# Patient Record
Sex: Male | Born: 1950 | Race: Black or African American | Hispanic: No | Marital: Married | Smoking: Never smoker
Health system: Southern US, Community
[De-identification: ages and names within clinical notes are randomized; demographics above are authoritative.]

## PROBLEM LIST (undated history)

## (undated) DIAGNOSIS — N189 Chronic kidney disease, unspecified: Secondary | ICD-10-CM

## (undated) DIAGNOSIS — C9000 Multiple myeloma not having achieved remission: Secondary | ICD-10-CM

## (undated) DIAGNOSIS — F09 Unspecified mental disorder due to known physiological condition: Secondary | ICD-10-CM

## (undated) DIAGNOSIS — F0390 Unspecified dementia without behavioral disturbance: Secondary | ICD-10-CM

## (undated) DIAGNOSIS — C49A0 Gastrointestinal stromal tumor, unspecified site: Secondary | ICD-10-CM

## (undated) DIAGNOSIS — I10 Essential (primary) hypertension: Secondary | ICD-10-CM

## (undated) DIAGNOSIS — H409 Unspecified glaucoma: Secondary | ICD-10-CM

## (undated) DIAGNOSIS — M25562 Pain in left knee: Secondary | ICD-10-CM

## (undated) DIAGNOSIS — I2510 Atherosclerotic heart disease of native coronary artery without angina pectoris: Secondary | ICD-10-CM

## (undated) HISTORY — PX: NO PAST SURGERIES: SHX2092

## (undated) HISTORY — PX: ABDOMINAL SURGERY: SHX537

---

## 2012-03-30 LAB — CBC
HGB: 14.4 g/dL (ref 13.0–18.0)
MCH: 27.8 pg (ref 26.0–34.0)
MCV: 84 fL (ref 80–100)
Platelet: 216 10*3/uL (ref 150–440)
RDW: 14 % (ref 11.5–14.5)
WBC: 16.4 10*3/uL — ABNORMAL HIGH (ref 3.8–10.6)

## 2012-03-30 LAB — BASIC METABOLIC PANEL
Anion Gap: 9 (ref 7–16)
BUN: 19 mg/dL — ABNORMAL HIGH (ref 7–18)
Calcium, Total: 8.6 mg/dL (ref 8.5–10.1)
Chloride: 106 mmol/L (ref 98–107)
Creatinine: 1.97 mg/dL — ABNORMAL HIGH (ref 0.60–1.30)
EGFR (African American): 41 — ABNORMAL LOW
Osmolality: 290 (ref 275–301)
Potassium: 3.1 mmol/L — ABNORMAL LOW (ref 3.5–5.1)

## 2012-03-30 LAB — URINALYSIS, COMPLETE
Bilirubin,UR: NEGATIVE
Blood: NEGATIVE
Ketone: NEGATIVE
Ph: 6 (ref 4.5–8.0)
Protein: 100
RBC,UR: 3 /HPF (ref 0–5)
Renal Epithelial: 8
Squamous Epithelial: NONE SEEN

## 2012-03-30 LAB — HEPATIC FUNCTION PANEL A (ARMC)
Albumin: 2.8 g/dL — ABNORMAL LOW (ref 3.4–5.0)
SGOT(AST): 16 U/L (ref 15–37)
Total Protein: 7.7 g/dL (ref 6.4–8.2)

## 2012-03-31 ENCOUNTER — Inpatient Hospital Stay: Payer: Self-pay | Admitting: Internal Medicine

## 2012-04-01 LAB — URINE CULTURE

## 2012-04-01 LAB — CBC WITH DIFFERENTIAL/PLATELET
Basophil #: 0 10*3/uL (ref 0.0–0.1)
Eosinophil #: 0.1 10*3/uL (ref 0.0–0.7)
Eosinophil %: 2 %
Lymphocyte #: 1.9 10*3/uL (ref 1.0–3.6)
MCHC: 33.7 g/dL (ref 32.0–36.0)
MCV: 84 fL (ref 80–100)
Monocyte #: 0.7 x10 3/mm (ref 0.2–1.0)
Monocyte %: 9.9 %
Neutrophil #: 4.3 10*3/uL (ref 1.4–6.5)
Platelet: 235 10*3/uL (ref 150–440)
RBC: 4.92 10*6/uL (ref 4.40–5.90)
RDW: 13.9 % (ref 11.5–14.5)
WBC: 7.1 10*3/uL (ref 3.8–10.6)

## 2012-04-01 LAB — BASIC METABOLIC PANEL
Calcium, Total: 8.8 mg/dL (ref 8.5–10.1)
Co2: 24 mmol/L (ref 21–32)
EGFR (African American): 52 — ABNORMAL LOW
Osmolality: 287 (ref 275–301)
Potassium: 3.4 mmol/L — ABNORMAL LOW (ref 3.5–5.1)

## 2012-04-02 LAB — BASIC METABOLIC PANEL
BUN: 14 mg/dL (ref 7–18)
Calcium, Total: 9.3 mg/dL (ref 8.5–10.1)
Chloride: 105 mmol/L (ref 98–107)
Creatinine: 1.65 mg/dL — ABNORMAL HIGH (ref 0.60–1.30)
EGFR (African American): 50 — ABNORMAL LOW
EGFR (Non-African Amer.): 44 — ABNORMAL LOW
Glucose: 161 mg/dL — ABNORMAL HIGH (ref 65–99)
Osmolality: 285 (ref 275–301)
Potassium: 3.8 mmol/L (ref 3.5–5.1)
Sodium: 141 mmol/L (ref 136–145)

## 2012-04-05 LAB — CULTURE, BLOOD (SINGLE)

## 2014-10-30 NOTE — Discharge Summary (Signed)
PATIENT NAME:  Anthony Houston, Anthony Houston MR#:  [id number] DATE OF BIRTH:  Mar 29, 1951  DATE OF ADMISSION:  03/31/2012 DATE OF DISCHARGE:  04/03/2012  ADMITTING DIAGNOSIS: Acute pyelonephritis.   DISCHARGE DIAGNOSES: 1. Acute pyelonephritis due to Enterobacter aerogenes. 2. Benign prostatic hypertrophy.  3. Chronic constipation.  4. Acute on chronic renal failure, resolved.  5. Hypokalemia. 6. History of glaucoma.  7. History of right upper extremity nerve impingement.  8. Chronic lower extremity edema. 9. Diabetes mellitus with hemoglobin A1c 5.8.  10. Hypertension.  11. Cerebrovascular accident.  12. Vascular dementia.   DISCHARGE CONDITION: Stable.   DISCHARGE MEDICATIONS: The patient is to continue: 1. Aspirin 325 mg p.o. daily.  2. Simvastatin 20 milligrams p.o. at bedtime. 3. Coreg 12.5 mg twice daily. 4. Latanoprost 0.005% ophthalmic solution one drop to each affected eye once daily at bedtime.  5. Docusate sodium 100 mg p.o. as needed.  6. Senna 8.6 mg one pill twice daily.  7. Levaquin 500 mg p.o. daily for 12 more days.  8. Amlodipine 10 mg p.o. twice daily.  9. The patient is to hold furosemide until seen by primary care physician.   HOME OXYGEN: None.   DIET: 2 grams salt, low fat, low cholesterol, carbohydrate controlled diet, regular consistency.   ACTIVITY LIMITATIONS: As tolerated.   FOLLOWUP: Follow-up appointment with [facility] in two days after discharge.   CONSULTANT: None.   RADIOLOGIC STUDIES: Chest x-ray PA and lateral 03/30/2012 showed no acute cardiopulmonary disease. Ultrasound of kidneys bilaterally on 03/31/2012 revealed multiple bilateral renal cysts, also prostate enlargement.   HISTORY: The patient is a 64 year old African American male with past medical history significant for history of vascular dementia, history of benign prostatic hypertrophy, history of hypertension who presented to the hospital with complaints of problems  urinating. Please refer to Dr. Michaelle Birks admission note on 03/31/2012. Apparently, the patient developed dysuria, which was progressively worsening associated with some blood, also, some frequency of urination as well as intermittent bilateral flank pains.   PHYSICAL EXAMINATION:  On arrival to the Emergency Room the patient's temperature was mildly elevated to 99.3, pulse was 83, respiration rate 18, blood pressure 150/85, saturation was 97% on room air. Physical exam showed pitting edema in lower extremities, left greater than right, but no significant CVA tenderness on percussion.   LABORATORY, RADIOLOGICAL AND DIAGNOSTIC DATA: Lab data 03/30/2012 showed glucose 183, BUN and creatinine were 19 and 1.97, potassium was 3.1, estimated GFR for African American would be 31. The patient's hemoglobin A1c was checked and was found to be 5.8. Liver enzymes showed albumin level of 2.8 and mildly elevated direct bilirubin of 0.3. White blood cell count is elevated to 16.1, hemoglobin 14.4, platelet count 216. Blood cultures taken on 03/31/2012 x2 did not show any growth. Urine culture taken on 03/30/2012 revealed Enterobacter aerogenes more than 100,000 colony-forming units resistant to nitrofurantoin as well as cefazolin and cefoxitin. However, sensitive to ceftriaxone, ciprofloxacin, gentamicin, imipenem, trimethoprim sulfamethoxazole as well as levofloxacin.   HOSPITAL COURSE: Initially on arrival to the Emergency Room, the patient was initiated on Levaquin IV. With this therapy he significantly improved. He also was given IV fluids.   In regards to acute pyelonephritis, it was felt that the patient's acute pyelonephritis should be continued with antibiotic therapy to complete 14-day course the patient is to take twelve more days of Levaquin. The patient's acute on chronic renal failure was treated with IV antibiotics as well as IV fluids. With this therapy, the  patient's kidney function somewhat improved. On the  day of discharge, the patient's creatinine is stabilized at around 1.65 to 1.60. It is recommended to follow the patient's creatinine levels and make decisions about getting nephrology consultation as needed as outpatient. It was unclear why the patient would have renal insufficiency, however, diabetes mellitus, as well as possibly even urinary retention was implicated. The patient, however, with therapy was able to void and did not exhibit any hydronephrosis, but because the patient's prostate was noted to be significantly enlarged, it was felt that the patient would benefit from urology consultation as well and follow his voiding. While the patient was in ultrasound suite on 03/31/2012, he was not able to void. At that time, the patient's bladder volume was around 150 mL. It was postulated, however, the patient was not able to void at that time on 03/31/2002 because of his acute infection. With therapy, however, the patient's voiding abilities improved and he was not noted to have significant urinary retention and as mentioned above, the patient's kidney function also improved with IV fluids. However, because of concerns about voiding issues, it is recommended to follow up with urology.   The patient was also noted to have constipation. The patient was continued on his home medications. He is to continue Docusate as well as senna and add other medications if needed to improve his stool consistency. Constipation also is potential cause of the patient's urinary retention.   The patient was noted to be hypokalemic on arrival to the hospital. The patient's potassium level normalized with IV fluids as well as oral potassium supplementation. On the day of discharge, the patient's potassium level is normal. The patient was also noted to be hypomagnesemic and magnesium level was also supplemented.  In regards to other chronic medical problems such as his other chronic medical problems, such as history of glaucoma,  nerve impingement problems, chronic lower extremity edema, also diabetes, stroke, and hypertension, the patient is to continue his outpatient medications. The patient was noted to be somewhat hypotensive with IV fluid administration. It was felt that the patient would benefit from advancement of his Norvasc, so Norvasc was advanced to 5 mg twice daily dose. It is recommended to follow the patient's blood pressure readings and make decisions about medication therapy for blood pressure as outpatient. No ACE inhibitors were initiated for him because of his renal insufficiency.  For diabetes mellitus, the patient's hemoglobin A1c was found to be 5.8. The patient was continued on a diabetic diet. While he was in the hospital, he was also continued on sliding-scale insulin.   The patient is being discharged in stable condition with the above-mentioned medications and follow-up. His vital signs on the day of discharge: Temperature 98.1, pulse 68, respiratory rate 18, blood pressure 152/93, saturation was 94% to 97% on room air at rest.   TIME SPENT: 40 minutes.  ____________________________ Theodoro Grist, MD rv:ap D: 04/03/2012 17:33:14 ET T: 04/05/2012 11:15:07 ET JOB#: [id number]  cc: Theodoro Grist, MD, <Dictator> [facility] MD ELECTRONICALLY SIGNED 04/06/2012 18:27

## 2014-10-30 NOTE — H&P (Signed)
PATIENT NAME:  Anthony Houston, Anthony Houston MR#:  [id number] DATE OF BIRTH:  06/09/1951  DATE OF ADMISSION:  03/31/2012  PRIMARY CARE PHYSICIAN: [facility].  CHIEF COMPLAINT: "Problems urinating."   HISTORY OF PRESENT ILLNESS: This is a 64 year old male with history of hypertension, benign prostatic hypertrophy reportedly, and recent "renal failure" per spouse who presents with two weeks duration of progressive dysuria and high fever for the last two days. The patient apparently was in his usual state of health until about two weeks ago when he developed dysuria and it has been progressively worsening associated with streaks of blood in the toilet bowel when he passes urine, frequency, and intermittent bilateral flank pain. The flank pain is described as 5 out of 10 in intensity that is intermittent and resolves on its own, but recurs frequently in alternate sides. Two days ago the patient developed fevers. T-max was 104 per wife, who is a Librarian, academic. She notes that due to the discolored urine and foul-smelling nature of the urine she checked an over-the-counter urine dipstick at home which showed blood and numerous leukocytes and so she decided to bring him to the hospital. In the Emergency Department, the patient is noted to have leukocytosis and low-grade fever, so he is being admitted for concern of pyelonephritis.   Of note, the patient was recently evaluated at the [facility] by his primary care physician, on 03/22/2012 because of his hypertension. He notes that he is being treated for "renal failure" and that his hypertension has caused kidney disease. He also has bilateral renal cysts.  History is mostly obtained from the wife as the patient has mild dementia and she cannot recall his last creatinine level.   PAST MEDICAL HISTORY:  1. Chronic constipation.  2. Cerebrovascular accident x2, transient ischemic attack as well. There is no residual deficit, but the patient has developed  progressive dementia. 3. History of loss of consciousness due to hypotension as a result of overmedication with his hypertensive medications. 4. Hypertension.  5. Borderline diabetes.  6. Glaucoma.  7. Peripheral edema, particularly in bilateral lower extremities and upper arms and hands.  8. Benign prostatic hypertrophy.  9. Right upper extremity nerve impingement with associated paresthesias for which he wears a wrist brace as needed.  10. Bilateral renal cysts. 11. Dementia, vascular, after his stroke.  PAST SURGICAL HISTORY:  1. Liver biopsy.  2. Colonoscopy with four polypectomies.   MEDICATIONS: 1. Amlodipine 5 mg daily.  2. Aspirin 325 mg daily.  3. Coreg 12.5 mg twice a day.  4. Furosemide 20 mg daily in the evening.  5. Furosemide 40 mg daily in the morning.  6. Latanoprost 0.005% ophthalmic solution one drop to each eye at bedtime.  7. Simvastatin 20 mg daily at bedtime.  8. Compression stockings for peripheral edema.  9. Laxatives as needed.  ALLERGIES: IV potassium solution which causes a rash.   FAMILY HISTORY: The patient has three brothers. The patient did not know his father. Mother had history of diabetes and leukemia and one brother has hypertension.  SOCIAL HISTORY: Married for 23 years. Occasional alcohol intake. Denies tobacco and current illicit drug use. Remote illicit drug use about 20 years ago. 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   REVIEW OF SYSTEMS: CONSTITUTIONAL: Admits to fever, fatigue, and 2 to 5 pound weight loss over two weeks due to some exercise. EYES: Blurred vision at baseline because he wears glasses. Otherwise no new changes. Denies double vision and  pain. ENT: Denies tinnitus and ear pain.  RESPIRATORY: Denies cough, wheeze, or painful respirations. There is report of some dyspnea with minimal exertion such as walking from his bed to the bathroom or bending over to tie his shoelaces. CARDIOVASCULAR: Denies chest pain,  orthopnea, or palpitations. Had history of syncope due to overmedication for his hypertension. Admits to chronic bilateral lower extremity edema. GASTROINTESTINAL: Denies nausea, vomiting, diarrhea, abdominal pain, hematemesis, rectal bleeding, or melena. Admits to chronic constipation and requires laxatives. GENITOURINARY: Admits to dysuria, hematuria, frequency, and incontinence. No history of renal calculi. HEME: Denies easy bruising. INTEGUMENT: Denies rash. MUSCULOSKELETAL: Admits to intermittent left knee pain due to old injury. NEURO: Admits to chronic right upper extremity numbness due to nerve entrapment. History of cerebrovascular accident and transient ischemic attack. No seizure history. PSYCH: Denies anxiety and depression.   PHYSICAL EXAM:   VITALS: Temperature 99.3, pulse 83, respirations 18, blood pressure 150/85, and saturating 96% on room air. His initial weight here was 83.9 kg.   GENERAL: Well appearing African American male in no apparent distress.   EYES: Pupils are equal and reactive to light, 2 mm, anicteric sclerae. Normal lids.   HENT: Normocephalic, atraumatic. Normal external ears and nares. Posterior oropharynx is clear without exudate. No oral lesions.   PULMONARY: Lungs are clear to auscultation bilaterally. No rales, no rhonchi, and no wheezes. Normal respiratory effort.   CARDIOVASCULAR: Regular rate and rhythm. No murmurs appreciated. There is 2+ pitting edema in bilateral lower extremities, left greater than right. Distal pulses are difficult to palpate.   ABDOMEN: Soft, nontender, and nondistended with no hepatosplenomegaly and normal bowel sounds. No CVA tenderness.   RECTAL: No external hemorrhoids noted. The prostate was smooth, not enlarged, nontender. Guaiac negative.   SKIN: No rash or erythema noted. Skin is warm and dry.   LYMPHATIC: No cervical or inguinal adenopathy noted.   PSYCH: The patient is awake, alert, and oriented to person and place and  situation, not oriented to time. He is cooperative and has appropriate judgment.   LABORATORY AND RADIOLOGIC DATA: Glucose 183, BUN 19, creatinine 1.97, sodium 142, potassium 3.1, chloride 106, bicarbonate 27, calcium 8.6, anion gap 9, osmolality 290, and GFR 41. CBC shows WBC count of 16.4, hemoglobin 14.4, hematocrit 43.6, platelet count 216, and MCV of 84.   Urinalysis shows yellow cloudy urine, specific gravity of 1.017 with 100 mg/dL protein, 3+ leukocyte esterase, 201 white blood cells per high-power field, 3 RBC's, 3+ bacteria, and 8 renal epithelia per high-power field. No epithelial cells seen.   Liver function tests show bilirubin total of 0.9 with direct bilirubin of 0.3. Alkaline phosphatase is 79, ALT 2s, AST 16, total protein 7.7, and albumin 2.8.   Chest x-ray shows no acute infiltrate.   ASSESSMENT AND PLAN: A 64 year old gentleman with history as mentioned presenting with progressive dysuria, foul smelling urine and fever associated with back pain concerning most likely due to pyelonephritis.  1. Pyelonephritis. At this time, we will start him on Levaquin. He has received ceftriaxone in the Emergency Department. We will check renal ultrasound as well as blood and urine cultures. Would treat this gentleman for at least 14 days total antibiotic course. Follow up CBC 2. Acute on chronic renal failure. We do not have a baseline at this institution. We shall obtain records from the [facility] outpatient clinic and this has already been requested. Request was faxed to [facility] at [phone]. I was able to speak to the individual and he  reported that the creatinine was 1.7 on the last visit. We will hold his Lasix at this time, hydrate him with some IV fluids gently, and reassess BMP.  3. Hypokalemia. This is most likely as a result of his diuresis. We will replete with oral supplementation given his allergy to IV potassium.  4. Chronic lower extremity edema. We are holding Lasix  and continuing compression stockings. We can restart Lasix once acute renal failure improves.  5. Borderline diabetes mellitus. We will check a Hemoglobin A1c and, if elevated, we will cover with sliding scale insulin.  6. Hypertension. Stable currently. We will continue his home medications, amlodipine, and Coreg. Hold Lasix as mentioned above.  7. Cerebrovascular accident/transient ischemic attack. We will continue his aspirin and maintain blood pressure control.  8. Glaucoma. Continue his home medications.  9. Chronic constipation. Continue laxatives as needed. 10. Hyperlipidemia. Continue statin.  11. Prophylaxis with heparin.   CODE STATUS: The patient is a FULL CODE. This was discussed with him as well as his wife. The decision maker is his wife, Corky Downs.   TIME SPENT ON ADMISSION: 60 minutes. ____________________________ Samson Frederic, DO aeo:slb D: 03/31/2012 03:18:56 ET T: 03/31/2012 10:06:59 ET JOB#: [id number]  cc: Samson Frederic, DO, <Dictator> [place] SIGNED 04/17/2012 0:51

## 2018-07-25 ENCOUNTER — Emergency Department: Payer: Medicare Other

## 2018-07-25 ENCOUNTER — Other Ambulatory Visit: Payer: Self-pay

## 2018-07-25 ENCOUNTER — Inpatient Hospital Stay
Admission: EM | Admit: 2018-07-25 | Discharge: 2018-08-01 | DRG: 674 | Disposition: A | Discharge status: Home Health | Payer: Medicare Other | Attending: Internal Medicine | Admitting: Internal Medicine

## 2018-07-25 ENCOUNTER — Encounter: Payer: Self-pay | Admitting: *Deleted

## 2018-07-25 DIAGNOSIS — N2581 Secondary hyperparathyroidism of renal origin: Secondary | ICD-10-CM | POA: Diagnosis present

## 2018-07-25 DIAGNOSIS — N189 Chronic kidney disease, unspecified: Secondary | ICD-10-CM

## 2018-07-25 DIAGNOSIS — I2510 Atherosclerotic heart disease of native coronary artery without angina pectoris: Secondary | ICD-10-CM | POA: Diagnosis present

## 2018-07-25 DIAGNOSIS — I120 Hypertensive chronic kidney disease with stage 5 chronic kidney disease or end stage renal disease: Secondary | ICD-10-CM | POA: Diagnosis present

## 2018-07-25 DIAGNOSIS — Z9115 Patient's noncompliance with renal dialysis: Secondary | ICD-10-CM

## 2018-07-25 DIAGNOSIS — R531 Weakness: Secondary | ICD-10-CM

## 2018-07-25 DIAGNOSIS — Z23 Encounter for immunization: Secondary | ICD-10-CM

## 2018-07-25 DIAGNOSIS — N185 Chronic kidney disease, stage 5: Secondary | ICD-10-CM

## 2018-07-25 DIAGNOSIS — C9000 Multiple myeloma not having achieved remission: Secondary | ICD-10-CM | POA: Diagnosis present

## 2018-07-25 DIAGNOSIS — N179 Acute kidney failure, unspecified: Principal | ICD-10-CM | POA: Diagnosis present

## 2018-07-25 DIAGNOSIS — C49A0 Gastrointestinal stromal tumor, unspecified site: Secondary | ICD-10-CM | POA: Diagnosis present

## 2018-07-25 DIAGNOSIS — K219 Gastro-esophageal reflux disease without esophagitis: Secondary | ICD-10-CM | POA: Diagnosis present

## 2018-07-25 DIAGNOSIS — M25562 Pain in left knee: Secondary | ICD-10-CM | POA: Diagnosis present

## 2018-07-25 DIAGNOSIS — D631 Anemia in chronic kidney disease: Secondary | ICD-10-CM | POA: Diagnosis present

## 2018-07-25 DIAGNOSIS — H409 Unspecified glaucoma: Secondary | ICD-10-CM | POA: Diagnosis present

## 2018-07-25 DIAGNOSIS — Z111 Encounter for screening for respiratory tuberculosis: Secondary | ICD-10-CM

## 2018-07-25 DIAGNOSIS — F0390 Unspecified dementia without behavioral disturbance: Secondary | ICD-10-CM | POA: Diagnosis present

## 2018-07-25 DIAGNOSIS — N186 End stage renal disease: Secondary | ICD-10-CM

## 2018-07-25 DIAGNOSIS — I10 Essential (primary) hypertension: Secondary | ICD-10-CM | POA: Diagnosis present

## 2018-07-25 DIAGNOSIS — Z888 Allergy status to other drugs, medicaments and biological substances status: Secondary | ICD-10-CM

## 2018-07-25 DIAGNOSIS — Z992 Dependence on renal dialysis: Secondary | ICD-10-CM

## 2018-07-25 HISTORY — DX: Unspecified glaucoma: H40.9

## 2018-07-25 HISTORY — DX: Multiple myeloma not having achieved remission: C90.00

## 2018-07-25 HISTORY — DX: Atherosclerotic heart disease of native coronary artery without angina pectoris: I25.10

## 2018-07-25 HISTORY — DX: Chronic kidney disease, unspecified: N18.9

## 2018-07-25 HISTORY — DX: Essential (primary) hypertension: I10

## 2018-07-25 HISTORY — DX: Unspecified dementia, unspecified severity, without behavioral disturbance, psychotic disturbance, mood disturbance, and anxiety: F03.90

## 2018-07-25 HISTORY — DX: Unspecified mental disorder due to known physiological condition: F09

## 2018-07-25 HISTORY — DX: Gastrointestinal stromal tumor, unspecified site: C49.A0

## 2018-07-25 HISTORY — DX: Pain in left knee: M25.562

## 2018-07-25 LAB — COMPREHENSIVE METABOLIC PANEL
ALT: 9 U/L (ref 0–44)
AST: 16 U/L (ref 15–41)
Albumin: 4.2 g/dL (ref 3.5–5.0)
Alkaline Phosphatase: 53 U/L (ref 38–126)
Anion gap: 13 (ref 5–15)
BUN: 71 mg/dL — ABNORMAL HIGH (ref 8–23)
CO2: 21 mmol/L — ABNORMAL LOW (ref 22–32)
Calcium: 9.8 mg/dL (ref 8.9–10.3)
Chloride: 104 mmol/L (ref 98–111)
Creatinine, Ser: 8.35 mg/dL — ABNORMAL HIGH (ref 0.61–1.24)
GFR calc Af Amer: 7 mL/min — ABNORMAL LOW (ref >60)
GFR calc non Af Amer: 6 mL/min — ABNORMAL LOW (ref >60)
Glucose, Bld: 100 mg/dL — ABNORMAL HIGH (ref 70–99)
Potassium: 4.9 mmol/L (ref 3.5–5.1)
Sodium: 138 mmol/L (ref 135–145)
Total Bilirubin: 1 mg/dL (ref 0.3–1.2)
Total Protein: 8.5 g/dL — ABNORMAL HIGH (ref 6.5–8.1)

## 2018-07-25 LAB — TROPONIN I: Troponin I: <0.03 ng/mL (ref <0.03)

## 2018-07-25 LAB — CBC
HCT: 41.9 % (ref 39.0–52.0)
Hemoglobin: 13.5 g/dL (ref 13.0–17.0)
MCH: 27.4 pg (ref 26.0–34.0)
MCHC: 32.2 g/dL (ref 30.0–36.0)
MCV: 85.2 fL (ref 80.0–100.0)
Platelets: 276 10*3/uL (ref 150–400)
RBC: 4.92 MIL/uL (ref 4.22–5.81)
RDW: 13.7 % (ref 11.5–15.5)
WBC: 7 10*3/uL (ref 4.0–10.5)
nRBC: 0 % (ref 0.0–0.2)

## 2018-07-25 NOTE — ED Notes (Signed)
PT resting in bed in NAD. PT updated to the best of this RNs ability.

## 2018-07-25 NOTE — ED Triage Notes (Signed)
Per report patient from the [facility] [place] with co weakness. When questioned patient states this is true. Denies pain.

## 2018-07-25 NOTE — ED Notes (Signed)
Pt able to stand and pivot into treatment bed. Pt placed on monitor. NAD at this time.

## 2018-07-25 NOTE — ED Notes (Signed)
Tech states unable to draw blood, states has had picc line. Iv consult order in.

## 2018-07-25 NOTE — ED Provider Notes (Addendum)
[facility] Emergency Department Provider Note  ____________________________________________   First MD Initiated Contact with Patient 07/25/18 1704     (approximate)  I have reviewed the triage vital signs and the nursing notes.   HISTORY  Chief Complaint Weakness   HPI Anthony Houston is a 68 y.o. male with a history of chronic kidney disease as well as dementia was presented emergency department with generalized weakness today.  Denies any pain.  Denies any burning with urination.  Denies any chest pain, cough, fever or chills.  States that he has been told that he needs to have dialysis in the past but has refused.   No past medical history on file.  There are no active problems to display for this patient.     Prior to Admission medications   Not on File    Allergies Lisinopril  No family history on file.  Social History Social History   Tobacco Use  . Smoking status: Not on file  Substance Use Topics  . Alcohol use: Not on file  . Drug use: Not on file    Review of Systems  Constitutional: No fever/chills Eyes: No visual changes. ENT: No sore throat. Cardiovascular: Denies chest pain. Respiratory: Denies shortness of breath. Gastrointestinal: No abdominal pain.  No nausea, no vomiting.  No diarrhea.  No constipation. Genitourinary: Negative for dysuria. Musculoskeletal: Negative for back pain. Skin: Negative for rash. Neurological: Negative for headaches, focal weakness or numbness.   ____________________________________________   PHYSICAL EXAM:  VITAL SIGNS: ED Triage Vitals  Enc Vitals Group     BP 07/25/18 1506 91/71     Pulse Rate 07/25/18 1506 61     Resp 07/25/18 1506 18     Temp 07/25/18 1506 97.7 F (36.5 C)     Temp Source 07/25/18 1506 Oral     SpO2 07/25/18 1506 100 %     Weight 07/25/18 1508 180 lb (81.6 kg)     Height 07/25/18 1508 6' (1.829 m)     Head Circumference --      Peak Flow --     Pain Score 07/25/18 1508 0     Pain Loc --      Pain Edu? --      Excl. in [place]? --     Constitutional: Alert and oriented. Well appearing and in no acute distress. Eyes: Conjunctivae are normal.  Head: Atraumatic. Nose: No congestion/rhinnorhea. Mouth/Throat: Mucous membranes are moist.  Neck: No stridor.   Cardiovascular: Normal rate, regular rhythm. Grossly normal heart sounds.   Respiratory: Normal respiratory effort.  No retractions. Lungs CTAB. Gastrointestinal: Soft and nontender. No distention. Musculoskeletal: No lower extremity tenderness nor edema.  No joint effusions. Neurologic:  Normal speech and language. No gross focal neurologic deficits are appreciated. Skin:  Skin is warm, dry and intact. No rash noted. Psychiatric: Mood and affect are normal. Speech and behavior are normal.  ____________________________________________   LABS (all labs ordered are listed, but only abnormal results are displayed)  Labs Reviewed  COMPREHENSIVE METABOLIC PANEL - Abnormal; Notable for the following components:      Result Value   CO2 21 (*)    Glucose, Bld 100 (*)    BUN 71 (*)    Creatinine, Ser 8.35 (*)    Total Protein 8.5 (*)    GFR calc non Af Amer 6 (*)    GFR calc Af Amer 7 (*)    All other components within normal limits  CBC  TROPONIN I  URINALYSIS, COMPLETE (UACMP) WITH MICROSCOPIC  TSH   ____________________________________________  EKG  ED ECG REPORT I, Doran Stabler, the attending physician, personally viewed and interpreted this ECG.   Date: 07/25/2018  EKG Time: 1523  Rate: 62  Rhythm: normal sinus rhythm  Axis: Left  Intervals: Normal intervals  ST&T Change: No ST segment elevation or depression.  No abnormal T wave inversion.  ____________________________________________  RADIOLOGY  No acute finding on the chest x-ray nor on the head CT. ____________________________________________   PROCEDURES  Procedure(s) performed:    Procedures  Critical Care performed:   ____________________________________________   INITIAL IMPRESSION / ASSESSMENT AND PLAN / ED COURSE  Pertinent labs & imaging results that were available during my care of the patient were reviewed by me and considered in my medical decision making (see chart for details).  Differential diagnosis includes, but is not limited to, alcohol, illicit or prescription medications, or other toxic ingestion; intracranial pathology such as stroke or intracerebral hemorrhage; fever or infectious causes including sepsis; hypoxemia and/or hypercarbia; uremia; trauma; endocrine related disorders such as diabetes, hypoglycemia, and thyroid-related diseases; hypertensive encephalopathy; etc. As part of my medical decision making, I reviewed the following data within the [place] prior visits on file.  Patient is a patient of the [place].  ----------------------------------------- 7:15 PM on 07/25/2018 -----------------------------------------  Discussed patient's lab results with Dr. Mel Almond of the [facility].  Patient given verbal consent for me to communicate with the [facility].  Patient's last renal function taken at the [place] on 06/02/2018 was a creatinine of 6.2 with a BUN of 57.  Worsened today with a creatinine of 8.35 and a BUN of 71.  I believe the patient to be symptomatic from his worsening renal failure.  I discussed with him that if his kidneys continue to worsen that this could result in his death.  He says that he would like to be transferred to have a discussion about dialysis with his nephrologist.  I think this is reasonable.  We will transfer the patient to the [facility] hospital.  ____________________________________________   FINAL CLINICAL IMPRESSION(S) / ED DIAGNOSES  Acute on chronic kidney failure.  Uremia.  Generalized weakness.  NEW MEDICATIONS STARTED DURING THIS VISIT:  New Prescriptions   No medications on file     Note:  This  document was prepared using Dragon voice recognition software and may include unintentional dictation errors.     Orbie Pyo, MD 07/25/18 1916    Orbie Pyo, MD 07/25/18 1919  Discussed case with Dr. Sydnee Cabal from the [facility] who says that they are on dialysis diversion because they did not have staffing to support any dialysis patients.  Patient will be admitted to the [facility].  Signed to Dr. Jannifer Franklin.  Patient aware of plan.    Orbie Pyo, MD 07/26/18 [phone]

## 2018-07-26 ENCOUNTER — Encounter: Payer: Self-pay | Admitting: Internal Medicine

## 2018-07-26 ENCOUNTER — Other Ambulatory Visit (INDEPENDENT_AMBULATORY_CARE_PROVIDER_SITE_OTHER): Payer: Self-pay | Admitting: Vascular Surgery

## 2018-07-26 ENCOUNTER — Inpatient Hospital Stay: Payer: Medicare Other

## 2018-07-26 ENCOUNTER — Other Ambulatory Visit: Payer: Self-pay

## 2018-07-26 ENCOUNTER — Inpatient Hospital Stay
Admission: EM | Disposition: A | Discharge status: Home Health | Payer: Self-pay | Source: Home / Self Care | Attending: Internal Medicine

## 2018-07-26 DIAGNOSIS — Z888 Allergy status to other drugs, medicaments and biological substances status: Secondary | ICD-10-CM | POA: Diagnosis not present

## 2018-07-26 DIAGNOSIS — Z992 Dependence on renal dialysis: Secondary | ICD-10-CM

## 2018-07-26 DIAGNOSIS — Z111 Encounter for screening for respiratory tuberculosis: Secondary | ICD-10-CM | POA: Diagnosis not present

## 2018-07-26 DIAGNOSIS — K219 Gastro-esophageal reflux disease without esophagitis: Secondary | ICD-10-CM | POA: Diagnosis present

## 2018-07-26 DIAGNOSIS — N179 Acute kidney failure, unspecified: Secondary | ICD-10-CM | POA: Diagnosis present

## 2018-07-26 DIAGNOSIS — I2510 Atherosclerotic heart disease of native coronary artery without angina pectoris: Secondary | ICD-10-CM | POA: Diagnosis present

## 2018-07-26 DIAGNOSIS — N186 End stage renal disease: Secondary | ICD-10-CM

## 2018-07-26 DIAGNOSIS — H409 Unspecified glaucoma: Secondary | ICD-10-CM | POA: Diagnosis present

## 2018-07-26 DIAGNOSIS — I120 Hypertensive chronic kidney disease with stage 5 chronic kidney disease or end stage renal disease: Secondary | ICD-10-CM | POA: Diagnosis present

## 2018-07-26 DIAGNOSIS — M25562 Pain in left knee: Secondary | ICD-10-CM | POA: Diagnosis present

## 2018-07-26 DIAGNOSIS — R531 Weakness: Secondary | ICD-10-CM | POA: Diagnosis present

## 2018-07-26 DIAGNOSIS — C9000 Multiple myeloma not having achieved remission: Secondary | ICD-10-CM | POA: Diagnosis present

## 2018-07-26 DIAGNOSIS — D631 Anemia in chronic kidney disease: Secondary | ICD-10-CM | POA: Diagnosis present

## 2018-07-26 DIAGNOSIS — C49A0 Gastrointestinal stromal tumor, unspecified site: Secondary | ICD-10-CM | POA: Diagnosis present

## 2018-07-26 DIAGNOSIS — N2581 Secondary hyperparathyroidism of renal origin: Secondary | ICD-10-CM | POA: Diagnosis present

## 2018-07-26 DIAGNOSIS — F0390 Unspecified dementia without behavioral disturbance: Secondary | ICD-10-CM | POA: Diagnosis present

## 2018-07-26 DIAGNOSIS — I10 Essential (primary) hypertension: Secondary | ICD-10-CM | POA: Diagnosis present

## 2018-07-26 DIAGNOSIS — Z23 Encounter for immunization: Secondary | ICD-10-CM | POA: Diagnosis present

## 2018-07-26 DIAGNOSIS — Z9115 Patient's noncompliance with renal dialysis: Secondary | ICD-10-CM | POA: Diagnosis not present

## 2018-07-26 HISTORY — PX: DIALYSIS/PERMA CATHETER INSERTION: CATH118288

## 2018-07-26 LAB — BASIC METABOLIC PANEL
Anion gap: 14 (ref 5–15)
BUN: 76 mg/dL — ABNORMAL HIGH (ref 8–23)
CO2: 19 mmol/L — ABNORMAL LOW (ref 22–32)
Calcium: 9.7 mg/dL (ref 8.9–10.3)
Chloride: 109 mmol/L (ref 98–111)
Creatinine, Ser: 8.61 mg/dL — ABNORMAL HIGH (ref 0.61–1.24)
GFR calc Af Amer: 7 mL/min — ABNORMAL LOW (ref >60)
GFR, EST NON AFRICAN AMERICAN: 6 mL/min — AB (ref >60)
Glucose, Bld: 100 mg/dL — ABNORMAL HIGH (ref 70–99)
Potassium: 4.7 mmol/L (ref 3.5–5.1)
Sodium: 142 mmol/L (ref 135–145)

## 2018-07-26 LAB — CBC
HCT: 36.9 % — ABNORMAL LOW (ref 39.0–52.0)
Hemoglobin: 12.2 g/dL — ABNORMAL LOW (ref 13.0–17.0)
MCH: 27.1 pg (ref 26.0–34.0)
MCHC: 33.1 g/dL (ref 30.0–36.0)
MCV: 82 fL (ref 80.0–100.0)
Platelets: 197 10*3/uL (ref 150–400)
RBC: 4.5 MIL/uL (ref 4.22–5.81)
RDW: 13.6 % (ref 11.5–15.5)
WBC: 7.8 10*3/uL (ref 4.0–10.5)
nRBC: 0 % (ref 0.0–0.2)

## 2018-07-26 LAB — MRSA PCR SCREENING: MRSA BY PCR: NEGATIVE

## 2018-07-26 LAB — PHOSPHORUS: Phosphorus: 4.9 mg/dL — ABNORMAL HIGH (ref 2.5–4.6)

## 2018-07-26 LAB — TSH: TSH: 1.097 u[IU]/mL (ref 0.350–4.500)

## 2018-07-26 SURGERY — DIALYSIS/PERMA CATHETER INSERTION
Anesthesia: Choice

## 2018-07-26 MED ORDER — CHLORHEXIDINE GLUCONATE CLOTH 2 % EX PADS
6.0000 | MEDICATED_PAD | Freq: Every day | CUTANEOUS | Status: DC
  Administered 2018-07-26 – 2018-08-01 (×7): 6 via TOPICAL

## 2018-07-26 MED ORDER — MIDAZOLAM HCL 2 MG/2ML IJ SOLN
INTRAMUSCULAR | Status: DC | PRN
  Administered 2018-07-26: 2 mg via INTRAVENOUS

## 2018-07-26 MED ORDER — METHYLPREDNISOLONE SODIUM SUCC 125 MG IJ SOLR: 125.0000 mg | INTRAMUSCULAR | Status: DC | PRN

## 2018-07-26 MED ORDER — DIPHENHYDRAMINE HCL 50 MG/ML IJ SOLN: 50.0000 mg | Freq: Once | INTRAMUSCULAR | Status: DC

## 2018-07-26 MED ORDER — SODIUM CHLORIDE 0.9 % IV SOLN: 100.0000 mL | INTRAVENOUS | Status: DC | PRN

## 2018-07-26 MED ORDER — SODIUM CHLORIDE 0.9 % IV SOLN
INTRAVENOUS | Status: AC | PRN
  Administered 2018-07-26: 250 mL via INTRAVENOUS

## 2018-07-26 MED ORDER — HEPARIN SODIUM (PORCINE) 1000 UNIT/ML DIALYSIS
1000.0000 [IU] | INTRAMUSCULAR | Status: DC | PRN
  Filled 2018-07-26: qty 1

## 2018-07-26 MED ORDER — LIDOCAINE HCL (PF) 1 % IJ SOLN
5.0000 mL | INTRAMUSCULAR | Status: DC | PRN
  Filled 2018-07-26: qty 5

## 2018-07-26 MED ORDER — ONDANSETRON HCL 4 MG/2ML IJ SOLN: 4.0000 mg | Freq: Four times a day (QID) | INTRAMUSCULAR | Status: DC | PRN

## 2018-07-26 MED ORDER — CEFAZOLIN SODIUM-DEXTROSE 1-4 GM/50ML-% IV SOLN
1.0000 g | Freq: Once | INTRAVENOUS | Status: AC
  Administered 2018-07-26: 1 g via INTRAVENOUS

## 2018-07-26 MED ORDER — HEPARIN (PORCINE) IN NACL 1000-0.9 UT/500ML-% IV SOLN
INTRAVENOUS | Status: AC
  Filled 2018-07-26: qty 500

## 2018-07-26 MED ORDER — PENTAFLUOROPROP-TETRAFLUOROETH EX AERO
1.0000 "application " | INHALATION_SPRAY | CUTANEOUS | Status: DC | PRN
  Filled 2018-07-26: qty 30

## 2018-07-26 MED ORDER — FENTANYL CITRATE (PF) 100 MCG/2ML IJ SOLN
INTRAMUSCULAR | Status: AC
  Filled 2018-07-26: qty 2

## 2018-07-26 MED ORDER — CEFAZOLIN SODIUM-DEXTROSE 1-4 GM/50ML-% IV SOLN
INTRAVENOUS | Status: AC
  Administered 2018-07-26: 1 g via INTRAVENOUS
  Filled 2018-07-26: qty 50

## 2018-07-26 MED ORDER — ACETAMINOPHEN 650 MG RE SUPP: 650.0000 mg | Freq: Four times a day (QID) | RECTAL | Status: DC | PRN

## 2018-07-26 MED ORDER — FENTANYL CITRATE (PF) 100 MCG/2ML IJ SOLN
INTRAMUSCULAR | Status: DC | PRN
  Administered 2018-07-26: 50 ug via INTRAVENOUS

## 2018-07-26 MED ORDER — LIDOCAINE-EPINEPHRINE (PF) 1 %-1:200000 IJ SOLN
INTRAMUSCULAR | Status: AC
  Filled 2018-07-26: qty 10

## 2018-07-26 MED ORDER — MIDAZOLAM HCL 5 MG/5ML IJ SOLN
INTRAMUSCULAR | Status: AC
  Filled 2018-07-26: qty 5

## 2018-07-26 MED ORDER — HEPARIN SODIUM (PORCINE) 10000 UNIT/ML IJ SOLN
INTRAMUSCULAR | Status: AC
  Filled 2018-07-26: qty 1

## 2018-07-26 MED ORDER — HEPARIN SODIUM (PORCINE) 5000 UNIT/ML IJ SOLN
5000.0000 [IU] | Freq: Three times a day (TID) | INTRAMUSCULAR | Status: DC
  Administered 2018-07-26 – 2018-08-01 (×17): 5000 [IU] via SUBCUTANEOUS
  Filled 2018-07-26 (×17): qty 1

## 2018-07-26 MED ORDER — ALTEPLASE 2 MG IJ SOLR: 2.0000 mg | Freq: Once | INTRAMUSCULAR | Status: DC | PRN

## 2018-07-26 MED ORDER — FAMOTIDINE 20 MG PO TABS: 40.0000 mg | ORAL_TABLET | ORAL | Status: DC | PRN

## 2018-07-26 MED ORDER — ONDANSETRON HCL 4 MG PO TABS: 4.0000 mg | ORAL_TABLET | Freq: Four times a day (QID) | ORAL | Status: DC | PRN

## 2018-07-26 MED ORDER — SODIUM CHLORIDE 0.9 % IV SOLN
INTRAVENOUS | Status: DC
  Administered 2018-07-26: 15:00:00 via INTRAVENOUS

## 2018-07-26 MED ORDER — INFLUENZA VAC SPLIT HIGH-DOSE 0.5 ML IM SUSY
0.5000 mL | PREFILLED_SYRINGE | INTRAMUSCULAR | Status: AC
  Administered 2018-08-01: 0.5 mL via INTRAMUSCULAR
  Filled 2018-07-26 (×2): qty 0.5

## 2018-07-26 MED ORDER — HYDROMORPHONE HCL 1 MG/ML IJ SOLN: 1.0000 mg | Freq: Once | INTRAMUSCULAR | Status: DC | PRN

## 2018-07-26 MED ORDER — ACETAMINOPHEN 325 MG PO TABS: 650.0000 mg | ORAL_TABLET | Freq: Four times a day (QID) | ORAL | Status: DC | PRN

## 2018-07-26 MED ORDER — LIDOCAINE-PRILOCAINE 2.5-2.5 % EX CREA
1.0000 "application " | TOPICAL_CREAM | CUTANEOUS | Status: DC | PRN
  Filled 2018-07-26: qty 5

## 2018-07-26 MED ORDER — TUBERCULIN PPD 5 UNIT/0.1ML ID SOLN
5.0000 [IU] | Freq: Once | INTRADERMAL | Status: AC
  Administered 2018-07-26: 5 [IU] via INTRADERMAL
  Filled 2018-07-26: qty 0.1

## 2018-07-26 SURGICAL SUPPLY — 8 items
CATH PALINDROME RT-P 15FX19CM (CATHETERS) ×2 IMPLANT
DERMABOND ADVANCED (GAUZE/BANDAGES/DRESSINGS) ×2
DERMABOND ADVANCED .7 DNX12 (GAUZE/BANDAGES/DRESSINGS) IMPLANT
NDL ENTRY 21GA 7CM ECHOTIP (NEEDLE) IMPLANT
NEEDLE ENTRY 21GA 7CM ECHOTIP (NEEDLE) ×3 IMPLANT
PACK ANGIOGRAPHY (CUSTOM PROCEDURE TRAY) ×2 IMPLANT
SET INTRO CAPELLA COAXIAL (SET/KITS/TRAYS/PACK) ×2 IMPLANT
SUT MNCRL AB 4-0 PS2 18 (SUTURE) ×2 IMPLANT

## 2018-07-26 NOTE — Progress Notes (Signed)
Pre HD assessment    07/26/18 1835  Vital Signs  Temp (!) 97.5 F (36.4 C)  Temp Source Oral  Pulse Rate 63  Pulse Rate Source Monitor  Resp 13  BP 106/87  BP Location Left Arm  BP Method Automatic  Patient Position (if appropriate) Lying  Oxygen Therapy  SpO2 100 %  O2 Device Room Air  Pain Assessment  Pain Scale 0-10  Pain Score 0  Dialysis Weight  Weight 97.1 kg  Type of Weight Pre-Dialysis  Time-Out for Hemodialysis  What Procedure? HD  Pt Identifiers(min of two) First/Last Name;MRN/Account#  Correct Site? Yes  Correct Side? Yes  Correct Procedure? Yes  Consents Verified? Yes  Rad Studies Available? N/A  Safety Precautions Reviewed? Yes  Research scientist (physical sciences)  (2A)  Station Number 1  UF/Alarm Test Passed  Conductivity: Meter 14  Conductivity: Machine  14.1  pH 7.4  Reverse Osmosis main  Normal Saline Lot Number [id number]  Dialyzer Lot Number [id number]  Disposable Set Lot Number [id number]  Machine Temperature 98.6 F (37 C)  Musician and Audible Yes  Blood Lines Intact and Secured Yes  Pre Treatment Patient Checks  Vascular access used during treatment Catheter  Hepatitis B Surface Antigen Results  (unk )  Isolation Initiated Yes  Hepatitis B Surface Antibody  (unk)  Date Hepatitis B Surface Antibody Drawn 07/26/18  Hemodialysis Consent Verified Yes  Hemodialysis Standing Orders Initiated Yes  ECG (Telemetry) Monitor On Yes  Prime Ordered Normal Saline  Length of  DialysisTreatment -hour(s) 1.5 Hour(s)  Dialyzer Elisio 17H NR  Dialysate 2K, 2.5 Ca  Dialysis Anticoagulant None  Dialysate Flow Ordered 300  Blood Flow Rate Ordered 150 mL/min  Ultrafiltration Goal 0 Liters  Pre Treatment Labs Hepatitis B Surface Antigen;Phosphorus (HBSAB, PTH, HBCore )  Dialysis Blood Pressure Support Ordered Normal Saline  Education / Care Plan  Dialysis Education Provided Yes  Documented Education in Care Plan Yes

## 2018-07-26 NOTE — Consult Note (Signed)
[facility] CONSULT NOTE    Date: 07/26/2018                  Patient Name:  Anthony Houston  MRN: [id number]  DOB: [birth year]  Age / Sex: 68 y.o., male         PCP: System, Pcp Not In                 Service Requesting Consult: Hospitalist                 Reason for Consult: Initiation of dialysis.            History of Present Illness: Patient is a 68 y.o. male with a PMHx of chronic kidney disease stage V, coronary disease, cognitive disorder, dementia, hypertension, history of multiple myeloma, who was admitted to [facility] on 07/25/2018 for evaluation of increasing weakness.  He resides at a local nursing home.  He receives most of his primary care at the [facility].  Patient had recent labs performed at the [place] on 06/02/18 which indicated a creatinine of 6.2 with a BUN of 57.  Renal function has worsened and creatinine was 8.35 with a BUN of 71 and upon admission here.  Patient came into the hospital to initiate dialysis.  Transferred to the [place] was attempted however there were no dialysis beds available there.  He does not have an underlying axis.  We have consulted with vascular surgery to place a PermCath.   Medications: Outpatient medications: No medications prior to admission.    Current medications: Current Facility-Administered Medications  Medication Dose Route Frequency Provider Last Rate Last Dose  . 0.9 %  sodium chloride infusion  100 mL Intravenous PRN Burle Kwan, MD      . 0.9 %  sodium chloride infusion  100 mL Intravenous PRN Elisabeth Strom, MD      . acetaminophen (TYLENOL) tablet 650 mg  650 mg Oral Q6H PRN Lance Coon, MD       Or  . acetaminophen (TYLENOL) suppository 650 mg  650 mg Rectal Q6H PRN Lance Coon, MD      . alteplase (CATHFLO ACTIVASE) injection 2 mg  2 mg Intracatheter Once PRN Meridith Romick, MD      . Chlorhexidine Gluconate Cloth 2 % PADS 6 each  6 each Topical Q0600 Holley Raring, Nikeisha Klutz, MD   6 each at 07/26/18  0940  . heparin injection 1,000 Units  1,000 Units Dialysis PRN Anthonette Legato, MD      . heparin injection 5,000 Units  5,000 Units Subcutaneous Camelia Phenes Lance Coon, MD   5,000 Units at 07/26/18 0542  . [START ON 07/27/2018] Influenza vac split quadrivalent PF (FLUZONE HIGH-DOSE) injection 0.5 mL  0.5 mL Intramuscular Tomorrow-1000 Lance Coon, MD      . lidocaine (PF) (XYLOCAINE) 1 % injection 5 mL  5 mL Intradermal PRN Coltan Spinello, MD      . lidocaine-prilocaine (EMLA) cream 1 application  1 application Topical PRN Shuan Statzer, MD      . ondansetron (ZOFRAN) tablet 4 mg  4 mg Oral Q6H PRN Lance Coon, MD       Or  . ondansetron (ZOFRAN) injection 4 mg  4 mg Intravenous Q6H PRN Lance Coon, MD      . pentafluoroprop-tetrafluoroeth (GEBAUERS) aerosol 1 application  1 application Topical PRN Holley Raring, Wilbur Labuda, MD      . tuberculin injection 5 Units  5 Units Intradermal Once Anthonette Legato, MD  5 Units at 07/26/18 1154      Allergies: Allergies  Allergen Reactions  . Lisinopril       Past Medical History: Past Medical History:  Diagnosis Date  . CAD (coronary artery disease)   . CKD (chronic kidney disease)   . Cognitive disorder   . Dementia ([place])   . 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 Glaucoma   . Hypertension   . Knee pain, left   . Multiple myeloma [facility])      Past Surgical History: Past Surgical History:  Procedure Laterality Date  . NO PAST SURGERIES       Family History: History reviewed. No pertinent family history.   Social History: Social History   Socioeconomic History  . Marital status: Married    Spouse name: Not on file  . Number of children: Not on file  . Years of education: Not on file  . Highest education level: Not on file  Occupational History  . Not on file  Social Needs  . Financial resource strain: Not on file  . Food insecurity:    Worry: Not on file    Inability: Not on file  . Transportation  needs:    Medical: Not on file    Non-medical: Not on file  Tobacco Use  . Smoking status: Never Smoker  . Smokeless tobacco: Never Used  Substance and Sexual Activity  . Alcohol use: Not Currently    Frequency: Never  . Drug use: Never  . Sexual activity: Not Currently  Lifestyle  . Physical activity:    Days per week: Not on file    Minutes per session: Not on file  . Stress: Not on file  Relationships  . Social connections:    Talks on phone: Not on file    Gets together: Not on file    Attends religious service: Not on file    Active member of club or organization: Not on file    Attends meetings of clubs or organizations: Not on file    Relationship status: Not on file  . Intimate partner violence:    Fear of current or ex partner: Not on file    Emotionally abused: Not on file    Physically abused: Not on file    Forced sexual activity: Not on file  Other Topics Concern  . Not on file  Social History Narrative  . Not on file     Review of Systems: Review of Systems  Constitutional: Positive for malaise/fatigue. Negative for chills and fever.  HENT: Negative for congestion, hearing loss and tinnitus.   Eyes: Negative for blurred vision and double vision.  Respiratory: Negative for cough and sputum production.   Cardiovascular: Negative for chest pain and palpitations.  Gastrointestinal: Positive for nausea. Negative for heartburn and vomiting.  Genitourinary: Negative for dysuria and urgency.  Skin: Negative for itching and rash.  Neurological: Positive for weakness.  Endo/Heme/Allergies: Negative for polydipsia. Does not bruise/bleed easily.  Psychiatric/Behavioral: Negative for depression.     Vital Signs: Blood pressure 109/82, pulse (!) 59, temperature 97.7 F (36.5 C), temperature source Oral, resp. rate 16, height 6' (1.829 m), weight 92.4 kg, SpO2 100 %.  Weight trends: Filed Weights   07/25/18 1508 07/26/18 0456  Weight: 81.6 kg 92.4 kg     Physical Exam: General: NAD, sitting up in bed  Head: Normocephalic, atraumatic.  Eyes: Anicteric, EOMI  Nose: Mucous membranes moist, not inflammed, nonerythematous.  Throat:  Oropharynx nonerythematous, no exudate appreciated.   Neck: Supple, trachea midline.  Lungs:  Normal respiratory effort. Clear to auscultation BL without crackles or wheezes.  Heart: RRR. S1 and S2 normal without gallop, murmur, or rubs.  Abdomen:  BS normoactive. Soft, Nondistended, non-tender.  No masses or organomegaly.  Extremities: No pretibial edema.  Neurologic: Awake, has stuttering speech, follows commands  Skin: No visible rashes, scars.    Lab results: Basic Metabolic Panel: Recent Labs  Lab 07/25/18 1559 07/26/18 0541  NA 138 142  K 4.9 4.7  CL 104 109  CO2 21* 19*  GLUCOSE 100* 100*  BUN 71* 76*  CREATININE 8.35* 8.61*  CALCIUM 9.8 9.7    Liver Function Tests: Recent Labs  Lab 07/25/18 1559  AST 16  ALT 9  ALKPHOS 53  BILITOT 1.0  PROT 8.5*  ALBUMIN 4.2   No results for input(s): LIPASE, AMYLASE in the last 168 hours. No results for input(s): AMMONIA in the last 168 hours.  CBC: Recent Labs  Lab 07/25/18 1559 07/26/18 0541  WBC 7.0 7.8  HGB 13.5 12.2*  HCT 41.9 36.9*  MCV 85.2 82.0  PLT 276 197    Cardiac Enzymes: Recent Labs  Lab 07/25/18 1559  TROPONINI <0.03    BNP: Invalid input(s): POCBNP  CBG: No results for input(s): GLUCAP in the last 168 hours.  Microbiology: Results for orders placed or performed during the hospital encounter of 07/25/18  MRSA PCR Screening     Status: None   Collection Time: 07/26/18  5:40 AM  Result Value Ref Range Status   MRSA by PCR NEGATIVE NEGATIVE Final    Comment:        The GeneXpert MRSA Assay (FDA approved for NASAL specimens only), is one component of a comprehensive MRSA colonization surveillance program. It is not intended to diagnose MRSA infection nor to guide or monitor treatment for MRSA  infections. Performed at [facility], [address] [place],  [address]     Coagulation Studies: No results for input(s): LABPROT, INR in the last 72 hours.  Urinalysis: No results for input(s): COLORURINE, LABSPEC, PHURINE, GLUCOSEU, HGBUR, BILIRUBINUR, KETONESUR, PROTEINUR, UROBILINOGEN, NITRITE, LEUKOCYTESUR in the last 72 hours.  Invalid input(s): APPERANCEUR    Imaging: Dg Chest 2 View  Result Date: 07/25/2018 CLINICAL DATA:  Weakness. EXAM: CHEST - 2 VIEW COMPARISON:  03/30/2012. FINDINGS: Mediastinum hilar structures normal. Low lung volumes. No focal alveolar infiltrate. No pleural effusion or pneumothorax. Heart size normal. Diffuse osteopenia degenerative change thoracic spine. IMPRESSION: Low lung volumes.  No acute abnormality. Electronically Signed   By: Marcello Moores  Register   On: 07/25/2018 15:52   Ct Head Wo Contrast  Result Date: 07/25/2018 CLINICAL DATA:  Generalized muscle weakness EXAM: CT HEAD WITHOUT CONTRAST TECHNIQUE: Contiguous axial images were obtained from the base of the skull through the vertex without intravenous contrast. Sagittal and coronal MPR images reconstructed from axial data set. COMPARISON:  None FINDINGS: Brain: Generalized atrophy with ex vacuo dilatation of the ventricular system. No midline shift or mass effect. Small vessel chronic ischemic changes of deep cerebral white matter. 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 No intracranial hemorrhage, mass lesion, or evidence of acute infarction. No extra-axial fluid collection. Vascular: Minimal atherosclerotic calcifications of internal carotid arteries at skull base Skull: Demineralized but intact Sinuses/Orbits: Clear Other: N/A IMPRESSION: Atrophy with small vessel chronic ischemic changes of deep cerebral white matter. Old LEFT basal ganglia lacunar infarct. No acute intracranial abnormalities. Electronically Signed  By: Lavonia Dana M.D.   On: 07/25/2018 18:30   US Renal  Result  Date: 07/26/2018 CLINICAL DATA:  Stage 5 chronic kidney disease. EXAM: RENAL / URINARY TRACT ULTRASOUND COMPLETE COMPARISON:  Urinary tract ultrasound 03/31/2012. FINDINGS: Right Kidney: Renal measurements: Approximately 9.8 x 5.5 x 5.2 cm = volume: 146 mL. No hydronephrosis. Echogenic parenchyma with mild to moderate diffuse cortical thinning, a new finding since the 2013 examination. Multiple benign cysts as noted previously, the largest arising from the mid kidney measuring approximately 3.6 cm. No solid renal masses. No convincing evidence of nephrolithiasis. Left Kidney: Renal measurements: Approximately 11.4 x 5.0 x 3.7 cm = volume: 110 mL. No hydronephrosis. Echogenic parenchyma with marked diffuse cortical thinning, a new finding since the 2013 that [place]. Multiple benign cysts as noted previously, the largest arising from the mid kidney measuring approximately 4.5 cm. No solid renal masses. No convincing evidence of nephrolithiasis. Bladder: Decompressed. Chronic wall thickening up to 6 mm, unchanged since the 2013 examination. BILATERAL ureteral jets visualized at color Doppler evaluation. Indentation of the base of the bladder by the prostate gland. IMPRESSION: 1. Echogenic parenchyma and diffuse cortical thinning involving both kidneys indicating chronic renal disease. 2. Multiple BILATERAL renal cysts as noted on the prior ultrasound in 2013. 3. No evidence of hydronephrosis involving either kidney to suggest obstruction as a cause of renal insufficiency. 4. Chronic thickening of the bladder wall, unchanged since the prior ultrasound in 2013, likely chronic outlet obstruction due to prostatomegaly. Electronically Signed   By: Evangeline Dakin M.D.   On: 07/26/2018 10:51      Assessment & Plan: Pt is a 68 y.o. male with a PMHx of chronic kidney disease stage V, coronary disease, cognitive disorder, dementia, hypertension, history of multiple myeloma, who was admitted to [facility] on 07/25/2018 for  evaluation of increasing weakness.  1.  End stage renal disease:   At this point and we don't have access to his labs at the [place] was in contact with the [place].  On 06/02/18 his creatinine was 6.2 with a BUN of 57.  His renal function is worse now and his renal ultrasound is consistent with advanced medical renal disease.  Patient has come in for initiation of dialysis.  He's had prior conversations with [place] physicians regarding this.  He is now willing to proceed with treatment.  We have consulted with basilar surgery for PermCath placement.  Further plan to be determined as patient progresses.  2.  Anemia chronic kidney disease.  Hemoglobin currently 12.2.  No indication for Procrit at the moment.  3.  Secondary hyperparathyroidism.  We will more fully evaluate PTHand phosphorus during dialysis treatment.  4.  Thanks for consultation.

## 2018-07-26 NOTE — Consult Note (Signed)
[facility]  MRN : [id number]  [facility] is a 68 y.o. (06-19-1951) male who presents with chief complaint of  Chief Complaint  Patient presents with  . Weakness   History of Present Illness:  The patient is a 68 year old male with a past medical history of:   [facility] CAD (coronary artery disease)   . CKD (chronic kidney disease)   . Cognitive disorder   . Dementia ([facility])   . 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 Glaucoma   . Hypertension   . Knee pain, left   . Multiple myeloma [facility])    He presented to the [facility] ED with a chief complaint of weakness which was progressively worsening over the "last few days". The patient has a know history of worsening kidney function however has been refusing dialysis. Patient is a resident of [facility]. He usually receives his healthcare from the [facility].   Patient denies any other symptoms such as chest pain, shortness or breath, fever, nausea or vomiting. Patient's last renal function taken at the [facility] on 06/02/2018 was a creatinine of 6.2 with a BUN of 57.  During, his workup in the ED his renal function was noted to be creatinine of 8.35 and a BUN of 71.  Seen by nephrology. Patient is now consenting to dialysis.  Vascular surgery consulted by Dr. Holley Raring for permcath insertion.   Current Facility-Administered Medications  Medication Dose Route Frequency Provider Last Rate Last Dose  . 0.9 %  sodium chloride infusion  100 mL Intravenous PRN Lateef, Munsoor, MD      . 0.9 %  sodium chloride infusion  100 mL Intravenous PRN Lateef, Munsoor, MD      . acetaminophen (TYLENOL) tablet 650 mg  650 mg Oral Q6H PRN Lance Coon, MD       Or  . acetaminophen (TYLENOL) suppository 650 mg  650 mg Rectal Q6H PRN Lance Coon, MD      . alteplase (CATHFLO ACTIVASE) injection 2 mg  2 mg Intracatheter Once PRN Lateef, Munsoor, MD      . Chlorhexidine Gluconate Cloth 2 % PADS 6  each  6 each Topical Q0600 Holley Raring, Munsoor, MD   6 each at 07/26/18 0940  . heparin injection 1,000 Units  1,000 Units Dialysis PRN Anthonette Legato, MD      . heparin injection 5,000 Units  5,000 Units Subcutaneous Camelia Phenes Lance Coon, MD   5,000 Units at 07/26/18 0542  . [START ON 07/27/2018] Influenza vac split quadrivalent PF (FLUZONE HIGH-DOSE) injection 0.5 mL  0.5 mL Intramuscular Tomorrow-1000 Lance Coon, MD      . lidocaine (PF) (XYLOCAINE) 1 % injection 5 mL  5 mL Intradermal PRN Lateef, Munsoor, MD      . lidocaine-prilocaine (EMLA) cream 1 application  1 application Topical PRN Lateef, Munsoor, MD      . ondansetron (ZOFRAN) tablet 4 mg  4 mg Oral Q6H PRN Lance Coon, MD       Or  . ondansetron (ZOFRAN) injection 4 mg  4 mg Intravenous Q6H PRN Lance Coon, MD      . pentafluoroprop-tetrafluoroeth (GEBAUERS) aerosol 1 application  1 application Topical PRN Holley Raring, Munsoor, MD      . tuberculin injection 5 Units  5 Units Intradermal Once Anthonette Legato, MD   5 Units at 07/26/18 1154   Past Medical History:  Diagnosis Date  . CAD (coronary artery disease)   .  CKD (chronic kidney disease)   . Cognitive disorder   . Dementia ([facility])   . 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 Glaucoma   . Hypertension   . Knee pain, left   . Multiple myeloma [facility])    Past Surgical History:  Procedure Laterality Date  . NO PAST SURGERIES     Social History Social History   Tobacco Use  . Smoking status: Never Smoker  . Smokeless tobacco: Never Used  Substance Use Topics  . Alcohol use: Not Currently    Frequency: Never  . Drug use: Never   Family History History reviewed. No pertinent family history.  Denies family history of PAD, venous or renal disease.   Allergies  Allergen Reactions  . Lisinopril    REVIEW OF SYSTEMS (Negative unless checked)  Constitutional: []Weight loss  []Fever  []Chills Cardiac: []Chest pain   []Chest pressure   []Palpitations    []Shortness of breath when laying flat   []Shortness of breath at rest   []Shortness of breath with exertion. Vascular:  []Pain in legs with walking   []Pain in legs at rest   []Pain in legs when laying flat   []Claudication   []Pain in feet when walking  []Pain in feet at rest  []Pain in feet when laying flat   []History of DVT   []Phlebitis   []Swelling in legs   []Varicose veins   []Non-healing ulcers Pulmonary:   []Uses home oxygen   []Productive cough   []Hemoptysis   []Wheeze  []COPD   []Asthma Neurologic:  []Dizziness  []Blackouts   []Seizures   []History of stroke   []History of TIA  []Aphasia   []Temporary blindness   []Dysphagia   []Weakness or numbness in arms   []Weakness or numbness in legs Musculoskeletal:  []Arthritis   []Joint swelling   []Joint pain   []Low back pain Hematologic:  []Easy bruising  []Easy bleeding   []Hypercoagulable state   []Anemic  []Hepatitis Gastrointestinal:  []Blood in stool   []Vomiting blood  [x]Gastroesophageal reflux/heartburn   []Difficulty swallowing. Genitourinary:  [x]Chronic kidney disease   []Difficult urination  []Frequent urination  []Burning with urination   []Blood in urine Skin:  []Rashes   []Ulcers   []Wounds Psychological:  []History of anxiety   [] History of major depression.  Physical Examination  Vitals:   07/26/18 0400 07/26/18 0430 07/26/18 0456 07/26/18 1200  BP: 126/74 106/81 125/90 109/82  Pulse: (!) 58 (!) 59 60 (!) 59  Resp: _0 Temp:   97.9 F (36.6 C) 97.7 F (36.5 C)  TempSrc:   Oral Oral  SpO2: 95% 99% 100% 100%  Weight:   92.4 kg   Height:   6' (1.829 m)    Body mass index is 27.63 kg/m. Gen:  WD/WN, NAD Head: [place]/AT, No temporalis wasting. Prominent temp pulse not noted. Ear/Nose/Throat: Hearing grossly intact, nares w/o erythema or drainage, oropharynx w/o Erythema/Exudate Eyes: Sclera non-icteric, conjunctiva clear Neck: Trachea midline.  No JVD.  Pulmonary:  Good air movement, respirations not labored,  equal bilaterally.  Cardiac: RRR, normal S1, S2. Vascular:  Vessel Right Left  Radial Palpable Palpable  Ulnar Palpable Palpable  Brachial Palpable Palpable  Carotid Palpable, without bruit Palpable, without bruit  Aorta Not palpable N/A  Femoral Palpable Palpable  Popliteal Palpable Palpable  PT Palpable Palpable  DP Palpable Palpable   Gastrointestinal: soft, non-tender/non-distended. No guarding/reflex.  Musculoskeletal: M/S 5/5 throughout.  Extremities  without ischemic changes.  No deformity or atrophy. No edema. Neurologic: Sensation grossly intact in extremities.  Symmetrical.  Speech is fluent. Motor exam as listed above. Psychiatric: Judgment intact, Mood & affect appropriate for pt's clinical situation. Dermatologic: No rashes or ulcers noted.  No cellulitis or open wounds. Lymph : No Cervical, Axillary, or Inguinal lymphadenopathy.  CBC Lab Results  Component Value Date   WBC 7.8 07/26/2018   HGB 12.2 (L) 07/26/2018   HCT 36.9 (L) 07/26/2018   MCV 82.0 07/26/2018   PLT 197 07/26/2018   BMET    Component Value Date/Time   NA 142 07/26/2018 0541   NA 141 04/02/2012 0955   K 4.7 07/26/2018 0541   K 3.8 04/02/2012 0955   CL 109 07/26/2018 0541   CL 105 04/02/2012 0955   CO2 19 (L) 07/26/2018 0541   CO2 24 04/02/2012 0955   GLUCOSE 100 (H) 07/26/2018 0541   GLUCOSE 161 (H) 04/02/2012 0955   BUN 76 (H) 07/26/2018 0541   BUN 14 04/02/2012 0955   CREATININE 8.61 (H) 07/26/2018 0541   CREATININE 1.65 (H) 04/02/2012 0955   CALCIUM 9.7 07/26/2018 0541   CALCIUM 9.3 04/02/2012 0955   GFRNONAA 6 (L) 07/26/2018 0541   GFRNONAA 44 (L) 04/02/2012 0955   GFRAA 7 (L) 07/26/2018 0541   GFRAA 50 (L) 04/02/2012 0955   Estimated Creatinine Clearance: 8.8 mL/min (A) (by C-G formula based on SCr of 8.61 mg/dL (H)).  COAG No results found for: INR, PROTIME  Radiology Dg Chest 2 View  Result Date: 07/25/2018 CLINICAL DATA:  Weakness. EXAM: CHEST - 2 VIEW COMPARISON:   03/30/2012. FINDINGS: Mediastinum hilar structures normal. Low lung volumes. No focal alveolar infiltrate. No pleural effusion or pneumothorax. Heart size normal. Diffuse osteopenia degenerative change thoracic spine. IMPRESSION: Low lung volumes.  No acute abnormality. Electronically Signed   By: Marcello Moores  Register   On: 07/25/2018 15:52   Ct Head Wo Contrast  Result Date: 07/25/2018 CLINICAL DATA:  Generalized muscle weakness EXAM: CT HEAD WITHOUT CONTRAST TECHNIQUE: Contiguous axial images were obtained from the base of the skull through the vertex without intravenous contrast. Sagittal and coronal MPR images reconstructed from axial data set. COMPARISON:  None FINDINGS: Brain: Generalized atrophy with ex vacuo dilatation of the ventricular system. No midline shift or mass effect. Small vessel chronic ischemic changes of deep cerebral white matter. 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 No intracranial hemorrhage, mass lesion, or evidence of acute infarction. No extra-axial fluid collection. Vascular: Minimal atherosclerotic calcifications of internal carotid arteries at skull base Skull: Demineralized but intact Sinuses/Orbits: Clear Other: N/A IMPRESSION: Atrophy with small vessel chronic ischemic changes of deep cerebral white matter. Old LEFT basal ganglia lacunar infarct. No acute intracranial abnormalities. Electronically Signed   By: Lavonia Dana M.D.   On: 07/25/2018 18:30   US Renal  Result Date: 07/26/2018 CLINICAL DATA:  Stage 5 chronic kidney disease. EXAM: RENAL / URINARY TRACT ULTRASOUND COMPLETE COMPARISON:  Urinary tract ultrasound 03/31/2012. FINDINGS: Right Kidney: Renal measurements: Approximately 9.8 x 5.5 x 5.2 cm = volume: 146 mL. No hydronephrosis. Echogenic parenchyma with mild to moderate diffuse cortical thinning, a Anthony finding since the 2013 examination. Multiple benign cysts as noted previously, the largest arising from the mid kidney measuring approximately 3.6 cm. No solid  renal masses. No convincing evidence of nephrolithiasis. Left Kidney: Renal measurements: Approximately 11.4 x 5.0 x 3.7 cm = volume: 110 mL. No hydronephrosis. Echogenic parenchyma with marked diffuse cortical thinning, a Anthony  finding since the 2013 that [place]. Multiple benign cysts as noted previously, the largest arising from the mid kidney measuring approximately 4.5 cm. No solid renal masses. No convincing evidence of nephrolithiasis. Bladder: Decompressed. Chronic wall thickening up to 6 mm, unchanged since the 2013 examination. BILATERAL ureteral jets visualized at color Doppler evaluation. Indentation of the base of the bladder by the prostate gland. IMPRESSION: 1. Echogenic parenchyma and diffuse cortical thinning involving both kidneys indicating chronic renal disease. 2. Multiple BILATERAL renal cysts as noted on the prior ultrasound in 2013. 3. No evidence of hydronephrosis involving either kidney to suggest obstruction as a cause of renal insufficiency. 4. Chronic thickening of the bladder wall, unchanged since the prior ultrasound in 2013, likely chronic outlet obstruction due to prostatomegaly. Electronically Signed   By: Evangeline Dakin M.D.   On: 07/26/2018 10:51   Assessment/Plan The patient is a 68 year old male with multiple medical issues, with known kidney disease now requiring dialysis 1. ESRD: Patient has progressed to ESRD requiring dialysis. At this time, the patient does not have an adequate dialysis access. Will place a permcath to allow the patient to dialyze. Procedure, risks and benefits explained to the patient. All questions answered. The patient wishes to proceed.  2. Hypertension: Encouraged good control as its slows the progression of atherosclerotic disease 3. CAD: Followed by his PCP / cardiologist  Discussed with Dr. Francene Castle, PA-C  07/26/2018 12:52 PM  This [place] was created with Dragon medical transcription system.  Any error is purely  unintentional.

## 2018-07-26 NOTE — Progress Notes (Signed)
HD tx end    07/26/18 2021  Vital Signs  Pulse Rate 64  Pulse Rate Source Monitor  Resp 15  BP 119/80  BP Location Left Arm  BP Method Automatic  Patient Position (if appropriate) Lying  Oxygen Therapy  SpO2 100 %  O2 Device Room Air  During Hemodialysis Assessment  Dialysis Fluid Bolus Normal Saline  Bolus Amount (mL) 250 mL  Intra-Hemodialysis Comments Tx completed

## 2018-07-26 NOTE — ED Notes (Signed)
ED TO INPATIENT HANDOFF REPORT  Name/Age/Gender [facility] 68 y.o. male  Code Status    Code Status Orders  (From admission, onward)         Start     Ordered   07/26/18 0235  Full code  Continuous     07/26/18 0235        Code Status History    This patient has a current code status but no historical code status.      Home/SNF/Other [facility]    Chief Complaint weakness  Level of Care/Admitting Diagnosis ED Disposition    ED Disposition Condition [facility]: [place] [[id number]]  Level of Care: Med-Surg [16]  Diagnosis: ESRD needing dialysis [facility]) [[id number]]  Admitting Physician: Lance Coon [[id number]]  Attending Physician: Jannifer Franklin, DAVID [phone]  Estimated length of stay: past midnight tomorrow  Certification:: I certify this patient will need inpatient services for at least 2 midnights  PT Class (Do Not Modify): Inpatient [101]  PT Acc Code (Do Not Modify): Private [1]       Medical History Past Medical History:  Diagnosis Date  . CAD (coronary artery disease)   . CKD (chronic kidney disease)   . Cognitive disorder   . Dementia ([place])   . Gastrointestinal stromal tumor (GIST) ([place])    Stage 2  . Glaucoma   . Hypertension   . Knee pain, left   . Multiple myeloma ([facility])     Allergies Allergies  Allergen Reactions  . Lisinopril     IV Location/Drains/Wounds Patient Lines/Drains/Airways Status   Active Line/Drains/Airways    Name:   Placement date:   Placement time:   Site:   Days:   Peripheral IV 07/25/18 Left;Anterior Forearm   07/25/18    1740    Forearm   1          Labs/Imaging Results for orders placed or performed during the hospital encounter of 07/25/18 (from the past 48 hour(s))  CBC     Status: None   Collection Time: 07/25/18  3:59 PM  Result Value Ref Range   WBC 7.0 4.0 - 10.5 K/uL   RBC 4.92 4.22 - 5.81 MIL/uL   Hemoglobin 13.5 13.0 - 17.0 g/dL   HCT 41.9 39.0  - 52.0 %   MCV 85.2 80.0 - 100.0 fL   MCH 27.4 26.0 - 34.0 pg   MCHC 32.2 30.0 - 36.0 g/dL   RDW 13.7 11.5 - 15.5 %   Platelets 276 150 - 400 K/uL   nRBC 0.0 0.0 - 0.2 %    Comment: Performed at [facility], [address] [place], [place] [address]  Comprehensive metabolic panel     Status: Abnormal   Collection Time: 07/25/18  3:59 PM  Result Value Ref Range   Sodium 138 135 - 145 mmol/L   Potassium 4.9 3.5 - 5.1 mmol/L   Chloride 104 98 - 111 mmol/L   CO2 21 (L) 22 - 32 mmol/L   Glucose, Bld 100 (H) 70 - 99 mg/dL   BUN 71 (H) 8 - 23 mg/dL   Creatinine, Ser 8.35 (H) 0.61 - 1.24 mg/dL   Calcium 9.8 8.9 - 10.3 mg/dL   Total Protein 8.5 (H) 6.5 - 8.1 g/dL   Albumin 4.2 3.5 - 5.0 g/dL   AST 16 15 - 41 U/L   ALT 9 0 - 44 U/L   Alkaline Phosphatase 53 38 - 126 U/L  Total Bilirubin 1.0 0.3 - 1.2 mg/dL   GFR calc non Af Amer 6 (L) >60 mL/min   GFR calc Af Amer 7 (L) >60 mL/min   Anion gap 13 5 - 15    Comment: Performed at [facility], [address] [place], [place] [address]  Troponin I - ONCE - STAT     Status: None   Collection Time: 07/25/18  3:59 PM  Result Value Ref Range   Troponin I <0.03 <0.03 ng/mL    Comment: Performed at [facility], [address] [place], [place] [address]  TSH     Status: None   Collection Time: 07/25/18  3:59 PM  Result Value Ref Range   TSH 1.097 0.350 - 4.500 uIU/mL    Comment: Performed by a 3rd Generation assay with a functional sensitivity of <=0.01 uIU/mL. Performed at [facility], [address], [place], [place] [address]    Dg Chest 2 View  Result Date: 07/25/2018 CLINICAL DATA:  Weakness. EXAM: CHEST - 2 VIEW COMPARISON:  03/30/2012. FINDINGS: Mediastinum hilar structures normal. Low lung volumes. No focal alveolar infiltrate. No pleural effusion or pneumothorax. Heart size normal. Diffuse osteopenia degenerative change thoracic spine. IMPRESSION: Low lung volumes.  No acute abnormality.  Electronically Signed   By: Marcello Moores  Register   On: 07/25/2018 15:52   Ct Head Wo Contrast  Result Date: 07/25/2018 CLINICAL DATA:  Generalized muscle weakness EXAM: CT HEAD WITHOUT CONTRAST TECHNIQUE: Contiguous axial images were obtained from the base of the skull through the vertex without intravenous contrast. Sagittal and coronal MPR images reconstructed from axial data set. COMPARISON:  None FINDINGS: Brain: Generalized atrophy with ex vacuo dilatation of the ventricular system. No midline shift or mass effect. Small vessel chronic ischemic changes of deep cerebral white matter. 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 No intracranial hemorrhage, mass lesion, or evidence of acute infarction. No extra-axial fluid collection. Vascular: Minimal atherosclerotic calcifications of internal carotid arteries at skull base Skull: Demineralized but intact Sinuses/Orbits: Clear Other: N/A IMPRESSION: Atrophy with small vessel chronic ischemic changes of deep cerebral white matter. Old LEFT basal ganglia lacunar infarct. No acute intracranial abnormalities. Electronically Signed   By: Lavonia Dana M.D.   On: 07/25/2018 18:30    Pending Labs Unresulted Labs (From admission, onward)    Start     Ordered   07/26/18 0240  Basic metabolic panel  Tomorrow morning,   STAT     07/26/18 0235   07/26/18 0500  CBC  Tomorrow morning,   STAT     07/26/18 0235   07/26/18 0235  HIV antibody (Routine Testing)  Once,   STAT     07/26/18 0235   07/25/18 1510  Urinalysis, Complete w Microscopic  ONCE - STAT,   STAT     07/25/18 1510          Vitals/Pain Today's Vitals   07/26/18 0330 07/26/18 0345 07/26/18 0400 07/26/18 0402  BP: 106/81  126/74   Pulse: (!) 57 (!) 57 (!) 58   Resp: 17 17 13    Temp:      TempSrc:      SpO2: 96% 98% 95%   Weight:      Height:      PainSc:    0-No pain    Isolation Precautions No active isolations  Medications Medications  heparin injection 5,000 Units (has no  administration in time range)  acetaminophen (TYLENOL) tablet 650 mg (has no administration in time range)  Or  acetaminophen (TYLENOL) suppository 650 mg (has no administration in time range)  ondansetron (ZOFRAN) tablet 4 mg (has no administration in time range)    Or  ondansetron (ZOFRAN) injection 4 mg (has no administration in time range)    Mobility Ambulatory with cane or walker

## 2018-07-26 NOTE — H&P (Signed)
[facility] [place] NAME: Anthony Houston    MR#:  [id number]  DATE OF BIRTH:  Nov 10, 1950  DATE OF ADMISSION:  07/25/2018  PRIMARY CARE PHYSICIAN: System, Pcp Not In   REQUESTING/REFERRING PHYSICIAN: Clearnce Hasten, MD  CHIEF COMPLAINT:   Chief Complaint  Patient presents with  . Weakness    HISTORY OF PRESENT ILLNESS:  Anthony Houston  is a 68 y.o. male who presents with chief complaint as above.  Patient presents with several days progressive weakness.  He states that he has had renal failure for some time, that in the past he had been refusing dialysis.  However, tonight he states that he is willing to pursue dialysis.  He is a [place] patient, but the [place] is currently not accepting dialysis patients.  Hospitalist called for admission  PAST MEDICAL HISTORY:   Past Medical History:  Diagnosis Date  . CAD (coronary artery disease)   . CKD (chronic kidney disease)   . Cognitive disorder   . Dementia ([place])   . 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 Glaucoma   . Hypertension   . Knee pain, left   . Multiple myeloma ([place])      PAST SURGICAL HISTORY:   Past Surgical History:  Procedure Laterality Date  . NO PAST SURGERIES       SOCIAL HISTORY:   Social History   Tobacco Use  . Smoking status: Never Smoker  . Smokeless tobacco: Never Used  Substance Use Topics  . Alcohol use: Not Currently    Frequency: Never     FAMILY HISTORY:    Family history reviewed and is non-contributory DRUG ALLERGIES:   Allergies  Allergen Reactions  . Lisinopril     MEDICATIONS AT HOME:   Prior to Admission medications   Not on File    REVIEW OF SYSTEMS:  Review of Systems  Constitutional: Negative for chills, fever, malaise/fatigue and weight loss.  HENT: Negative for ear pain, hearing loss and tinnitus.   Eyes: Negative for blurred vision, double vision, pain and redness.  Respiratory: Negative for  cough, hemoptysis and shortness of breath.   Cardiovascular: Negative for chest pain, palpitations, orthopnea and leg swelling.  Gastrointestinal: Negative for abdominal pain, constipation, diarrhea, nausea and vomiting.  Genitourinary: Negative for dysuria, frequency and hematuria.  Musculoskeletal: Negative for back pain, joint pain and neck pain.  Skin:       No acne, rash, or lesions  Neurological: Positive for weakness. Negative for dizziness, tremors and focal weakness.  Endo/Heme/Allergies: Negative for polydipsia. Does not bruise/bleed easily.  Psychiatric/Behavioral: Negative for depression. The patient is not nervous/anxious and does not have insomnia.      VITAL SIGNS:   Vitals:   07/25/18 1830 07/25/18 1930 07/25/18 2000 07/25/18 2030  BP: 118/81 127/76 124/87 (!) 119/98  Pulse: 60 (!) 57 60 62  Resp: _0 Temp:      TempSrc:      SpO2: 99% 99% 99% 98%  Weight:      Height:       Wt Readings from Last 3 Encounters:  07/25/18 81.6 kg    PHYSICAL EXAMINATION:  Physical Exam  Vitals reviewed. Constitutional: He is oriented to person, place, and time. He appears well-developed and well-nourished. No distress.  HENT:  Head: Normocephalic and atraumatic.  Mouth/Throat: Oropharynx is clear and moist.  Eyes: Pupils are equal, round, and reactive to light. Conjunctivae and  EOM are normal. No scleral icterus.  Neck: Normal range of motion. Neck supple. No JVD present. No thyromegaly present.  Cardiovascular: Normal rate, regular rhythm and intact distal pulses. Exam reveals no gallop and no friction rub.  No murmur heard. Respiratory: Effort normal and breath sounds normal. No respiratory distress. He has no wheezes. He has no rales.  GI: Soft. Bowel sounds are normal. He exhibits no distension. There is no abdominal tenderness.  Musculoskeletal: Normal range of motion.        General: No edema.     Comments: No arthritis, no gout  Lymphadenopathy:    He has no  cervical adenopathy.  Neurological: He is alert and oriented to person, place, and time. No cranial nerve deficit.  No dysarthria, no aphasia  Skin: Skin is warm and dry. No rash noted. No erythema.  Psychiatric: He has a normal mood and affect. His behavior is normal. Judgment and thought content normal.    LABORATORY PANEL:   CBC Recent Labs  Lab 07/25/18 1559  WBC 7.0  HGB 13.5  HCT 41.9  PLT 276   ------------------------------------------------------------------------------------------------------------------  Chemistries  Recent Labs  Lab 07/25/18 1559  NA 138  K 4.9  CL 104  CO2 21*  GLUCOSE 100*  BUN 71*  CREATININE 8.35*  CALCIUM 9.8  AST 16  ALT 9  ALKPHOS 53  BILITOT 1.0   ------------------------------------------------------------------------------------------------------------------  Cardiac Enzymes Recent Labs  Lab 07/25/18 1559  TROPONINI <0.03   ------------------------------------------------------------------------------------------------------------------  RADIOLOGY:  Dg Chest 2 View  Result Date: 07/25/2018 CLINICAL DATA:  Weakness. EXAM: CHEST - 2 VIEW COMPARISON:  03/30/2012. FINDINGS: Mediastinum hilar structures normal. Low lung volumes. No focal alveolar infiltrate. No pleural effusion or pneumothorax. Heart size normal. Diffuse osteopenia degenerative change thoracic spine. IMPRESSION: Low lung volumes.  No acute abnormality. Electronically Signed   By: Marcello Moores  Register   On: 07/25/2018 15:52   Ct Head Wo Contrast  Result Date: 07/25/2018 CLINICAL DATA:  Generalized muscle weakness EXAM: CT HEAD WITHOUT CONTRAST TECHNIQUE: Contiguous axial images were obtained from the base of the skull through the vertex without intravenous contrast. Sagittal and coronal MPR images reconstructed from axial data set. COMPARISON:  None FINDINGS: Brain: Generalized atrophy with ex vacuo dilatation of the ventricular system. No midline shift or mass  effect. Small vessel chronic ischemic changes of deep cerebral white matter. 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 No intracranial hemorrhage, mass lesion, or evidence of acute infarction. No extra-axial fluid collection. Vascular: Minimal atherosclerotic calcifications of internal carotid arteries at skull base Skull: Demineralized but intact Sinuses/Orbits: Clear Other: N/A IMPRESSION: Atrophy with small vessel chronic ischemic changes of deep cerebral white matter. Old LEFT basal ganglia lacunar infarct. No acute intracranial abnormalities. Electronically Signed   By: Lavonia Dana M.D.   On: 07/25/2018 18:30    EKG:   Orders placed or performed during the hospital encounter of 07/25/18  . ED EKG  . ED EKG  . EKG 12-Lead  . EKG 12-Lead    IMPRESSION AND PLAN:  Principal Problem:   ESRD needing dialysis ([place]) -creatinine and BUN significantly elevated.  We will admit him with a nephrology consult for initiation of dialysis.  We will keep him n.p.o. for any possible procedures such as Vas-Cath insertion Active Problems:   Weakness -likely due to his uremia, this is generalized weakness.  See above for treatment plan   HTN (hypertension) -continue home dose antihypertensives   CAD (coronary artery disease) -continue home medications  Chart  review performed and case discussed with ED provider. Labs, imaging and/or ECG reviewed by provider and discussed with patient/family. Management plans discussed with the patient and/or family.  DVT PROPHYLAXIS: SubQ heparin  GI PROPHYLAXIS:  None  ADMISSION STATUS: Inpatient     CODE STATUS: Full  TOTAL TIME TAKING CARE OF THIS PATIENT: 45 minutes.   Tiffinie Caillier [place] 07/26/2018, 12:18 AM  [facility]  Office  [phone]  CC: Primary care physician; System, Pcp Not In  Note:  This document was prepared using Dragon voice recognition software and may include unintentional dictation errors.

## 2018-07-26 NOTE — Progress Notes (Signed)
Post HD assessment    07/26/18 2026  Neurological  Level of Consciousness Alert  Orientation Level Oriented X4  Respiratory  Respiratory Pattern Regular;Unlabored  Chest Assessment Chest expansion symmetrical  Cardiac  Pulse Regular  ECG Monitor Yes  Cardiac Rhythm NSR  Vascular  R Radial Pulse +2  L Radial Pulse +2  Edema Generalized  Integumentary  Integumentary (WDL) X  Skin Color Appropriate for ethnicity  Musculoskeletal  Musculoskeletal (WDL) X  Generalized Weakness Yes  Assistive Device None  GU Assessment  Genitourinary (WDL) X  Genitourinary Symptoms  (HD)  Psychosocial  Psychosocial (WDL) WDL

## 2018-07-26 NOTE — Progress Notes (Signed)
Called [facility] and spoke with Museum/gallery conservator, Event organiser at the facility. Per [employer], patient is able to sign his own consents.

## 2018-07-26 NOTE — Progress Notes (Signed)
HD tx start    07/26/18 1845  Vital Signs  Pulse Rate 62  Pulse Rate Source Monitor  Resp 12  BP 104/77  BP Location Left Arm  BP Method Automatic  Patient Position (if appropriate) Lying  Oxygen Therapy  SpO2 100 %  O2 Device Room Air  During Hemodialysis Assessment  Blood Flow Rate (mL/min) 150 mL/min  Arterial Pressure (mmHg) -60 mmHg  Venous Pressure (mmHg) 50 mmHg  Transmembrane Pressure (mmHg) 40 mmHg  Ultrafiltration Rate (mL/min) 330 mL/min  Dialysate Flow Rate (mL/min) 300 ml/min  Conductivity: Machine  14.2  HD Safety Checks Performed Yes  Dialysis Fluid Bolus Normal Saline  Bolus Amount (mL) 250 mL  Intra-Hemodialysis Comments Tx initiated

## 2018-07-26 NOTE — Progress Notes (Signed)
[facility] [place] NAME: Anthony Houston    MR#:  [id number]  DATE OF BIRTH:  1951/02/07  SUBJECTIVE:  CHIEF COMPLAINT: Patient is resting comfortably awaiting for permacath placement  REVIEW OF SYSTEMS:  Limited review of system because of underlying cognitive dysfunction RESPIRATORY: No cough, shortness of breath, wheezing or hemoptysis.  CARDIOVASCULAR: No chest pain, orthopnea, edema.  GASTROINTESTINAL: No nausea, vomiting, diarrhea or abdominal pain.  SKIN: No rash or lesion. MUSCULOSKELETAL: No joint pain or arthritis.   NEUROLOGIC: No tingling, numbness, weakness.    DRUG ALLERGIES:   Allergies  Allergen Reactions  . Lisinopril     VITALS:  Blood pressure (!) 128/93, pulse 63, temperature 97.9 F (36.6 C), temperature source Oral, resp. rate 16, height 6' (1.829 m), weight 92.4 kg, SpO2 99 %.  PHYSICAL EXAMINATION:  GENERAL:  68 y.o.-year-old patient lying in the bed with no acute distress.  EYES: Pupils equal, round, reactive to light and accommodation. No scleral icterus. Extraocular muscles intact.  HEENT: Head atraumatic, normocephalic. Oropharynx and nasopharynx clear.  NECK:  Supple, no jugular venous distention. No thyroid enlargement, no tenderness.  LUNGS: Normal breath sounds bilaterally, no wheezing, rales,rhonchi or crepitation. No use of accessory muscles of respiration.  CARDIOVASCULAR: S1, S2 normal. No murmurs, rubs, or gallops.  ABDOMEN: Soft, nontender, nondistended. Bowel sounds present. EXTREMITIES: No pedal edema, cyanosis, or clubbing.  NEUROLOGIC: Awake and alert. Sensation intact. Gait not checked.  PSYCHIATRIC: The patient is alert and alert SKIN: No obvious rash, lesion, or ulcer.    LABORATORY PANEL:   CBC Recent Labs  Lab 07/26/18 0541  WBC 7.8  HGB 12.2*  HCT 36.9*  PLT 197    ------------------------------------------------------------------------------------------------------------------  Chemistries  Recent Labs  Lab 07/25/18 1559 07/26/18 0541  NA 138 142  K 4.9 4.7  CL 104 109  CO2 21* 19*  GLUCOSE 100* 100*  BUN 71* 76*  CREATININE 8.35* 8.61*  CALCIUM 9.8 9.7  AST 16  --   ALT 9  --   ALKPHOS 53  --   BILITOT 1.0  --    ------------------------------------------------------------------------------------------------------------------  Cardiac Enzymes Recent Labs  Lab 07/25/18 1559  TROPONINI <0.03   ------------------------------------------------------------------------------------------------------------------  RADIOLOGY:  Dg Chest 2 View  Result Date: 07/25/2018 CLINICAL DATA:  Weakness. EXAM: CHEST - 2 VIEW COMPARISON:  03/30/2012. FINDINGS: Mediastinum hilar structures normal. Low lung volumes. No focal alveolar infiltrate. No pleural effusion or pneumothorax. Heart size normal. Diffuse osteopenia degenerative change thoracic spine. IMPRESSION: Low lung volumes.  No acute abnormality. Electronically Signed   By: Marcello Moores  Register   On: 07/25/2018 15:52   Ct Head Wo Contrast  Result Date: 07/25/2018 CLINICAL DATA:  Generalized muscle weakness EXAM: CT HEAD WITHOUT CONTRAST TECHNIQUE: Contiguous axial images were obtained from the base of the skull through the vertex without intravenous contrast. Sagittal and coronal MPR images reconstructed from axial data set. COMPARISON:  None FINDINGS: Brain: Generalized atrophy with ex vacuo dilatation of the ventricular system. No midline shift or mass effect. Small vessel chronic ischemic changes of deep cerebral white matter. 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 No intracranial hemorrhage, mass lesion, or evidence of acute infarction. No extra-axial fluid collection. Vascular: Minimal atherosclerotic calcifications of internal carotid arteries at skull base Skull: Demineralized but intact  Sinuses/Orbits: Clear Other: N/A IMPRESSION: Atrophy with small vessel chronic ischemic changes of deep cerebral white matter. Old LEFT basal ganglia lacunar infarct. No acute intracranial abnormalities. Electronically Signed  By: Lavonia Dana M.D.   On: 07/25/2018 18:30   US Renal  Result Date: 07/26/2018 CLINICAL DATA:  Stage 5 chronic kidney disease. EXAM: RENAL / URINARY TRACT ULTRASOUND COMPLETE COMPARISON:  Urinary tract ultrasound 03/31/2012. FINDINGS: Right Kidney: Renal measurements: Approximately 9.8 x 5.5 x 5.2 cm = volume: 146 mL. No hydronephrosis. Echogenic parenchyma with mild to moderate diffuse cortical thinning, a new finding since the 2013 examination. Multiple benign cysts as noted previously, the largest arising from the mid kidney measuring approximately 3.6 cm. No solid renal masses. No convincing evidence of nephrolithiasis. Left Kidney: Renal measurements: Approximately 11.4 x 5.0 x 3.7 cm = volume: 110 mL. No hydronephrosis. Echogenic parenchyma with marked diffuse cortical thinning, a new finding since the 2013 that [place]. Multiple benign cysts as noted previously, the largest arising from the mid kidney measuring approximately 4.5 cm. No solid renal masses. No convincing evidence of nephrolithiasis. Bladder: Decompressed. Chronic wall thickening up to 6 mm, unchanged since the 2013 examination. BILATERAL ureteral jets visualized at color Doppler evaluation. Indentation of the base of the bladder by the prostate gland. IMPRESSION: 1. Echogenic parenchyma and diffuse cortical thinning involving both kidneys indicating chronic renal disease. 2. Multiple BILATERAL renal cysts as noted on the prior ultrasound in 2013. 3. No evidence of hydronephrosis involving either kidney to suggest obstruction as a cause of renal insufficiency. 4. Chronic thickening of the bladder wall, unchanged since the prior ultrasound in 2013, likely chronic outlet obstruction due to prostatomegaly.  Electronically Signed   By: Evangeline Dakin M.D.   On: 07/26/2018 10:51    EKG:   Orders placed or performed during the hospital encounter of 07/25/18  . ED EKG  . ED EKG  . EKG 12-Lead  . EKG 12-Lead    ASSESSMENT AND PLAN:    #End-stage renal disease Awaiting for new start of hemodialysis Vascular surgery consult placed for permacath placement-planning to do the procedure today Follow-up with nephrology for further recommendations of dialysis  #Generalized weakness probably secondary to uremia Patient will be benefited if it hemodialysis ASAP  #History of multiple myeloma Outpatient follow-up with oncology as recommended  #Essential hypertension-blood pressure is soft continue close monitoring and titrate meds as needed  #Dementia-he has some cognitive dysfunction  All the records are reviewed and case discussed with Care Management/Social Workerr. Management plans discussed with the patient, family and they are in agreement.  CODE STATUS:   TOTAL TIME TAKING CARE OF THIS PATIENT: 35  minutes.   POSSIBLE D/C IN 2-3  DAYS, DEPENDING ON CLINICAL CONDITION.  Note: This dictation was prepared with Dragon dictation along with smaller phrase technology. Any transcriptional errors that result from this process are unintentional.   Nicholes Mango M.D on 07/26/2018 at 3:19 PM  Between 7am to 6pm - Pager - [phone] After 6pm go to [email or web address] - password [email or web address] [facility]  Office  [phone]  CC: Primary care physician; System, Pcp Not In

## 2018-07-26 NOTE — Progress Notes (Signed)
Post HD assessment. Pt tolerated tx well without c/o or complication. Net UF 18, goal met.    07/26/18 2027  Vital Signs  Temp 98.1 F (36.7 C)  Temp Source Oral  Pulse Rate 62  Pulse Rate Source Monitor  Resp 14  BP 131/86  BP Location Left Arm  BP Method Automatic  Patient Position (if appropriate) Lying  Oxygen Therapy  SpO2 98 %  O2 Device Room Air  Dialysis Weight  Weight 95.2 kg  Type of Weight Post-Dialysis  Post-Hemodialysis Assessment  Rinseback Volume (mL) 250 mL  KECN 13.6 V  Dialyzer Clearance Lightly streaked  Duration of HD Treatment -hour(s) 1.5 hour(s)  Hemodialysis Intake (mL) 500 mL  UF Total -Machine (mL) 518 mL  Net UF (mL) 18 mL  Tolerated HD Treatment Yes  Education / Care Plan  Dialysis Education Provided Yes  Documented Education in Care Plan Yes

## 2018-07-26 NOTE — Progress Notes (Signed)
Pre HD assessment    07/26/18 1836  Neurological  Level of Consciousness Alert  Orientation Level Oriented X4  Respiratory  Respiratory Pattern Regular;Unlabored  Chest Assessment Chest expansion symmetrical  Cardiac  Pulse Regular  ECG Monitor Yes  Cardiac Rhythm NSR  Vascular  R Radial Pulse +2  L Radial Pulse +2  Edema Generalized  Integumentary  Integumentary (WDL) X  Skin Color Appropriate for ethnicity  Musculoskeletal  Musculoskeletal (WDL) X  Generalized Weakness Yes  Assistive Device None  GU Assessment  Genitourinary (WDL) X  Genitourinary Symptoms  (HD)  Psychosocial  Psychosocial (WDL) WDL

## 2018-07-26 NOTE — Op Note (Signed)
OPERATIVE NOTE   PROCEDURE: 1. Insertion of tunneled dialysis catheter right IJ approach with ultrasound and fluoroscopic guidance.  PRE-OPERATIVE DIAGNOSIS: End-stage renal disease requiring hemodialysis  POST-OPERATIVE DIAGNOSIS: Same  SURGEON: Hortencia Pilar.  ANESTHESIA: Conscious sedation was administered under my direct supervision by the interventional radiology RN. IV Versed plus fentanyl were utilized. Continuous ECG, pulse oximetry and blood pressure was monitored throughout the entire procedure. Conscious sedation was for a total of 24 minutes.  ESTIMATED BLOOD LOSS: Minimal cc  CONTRAST USED:  None  FLUOROSCOPY TIME: 0.1 minutes  INDICATIONS:   Anthony Houston a 68 y.o. y.o. male who presents with worsening renal function he will now require hemodialysis and therefore tunneled catheter is being placed.  Risks and benefits of been reviewed all questions been answered patient has agreed to proceed..  DESCRIPTION: After obtaining full informed written consent, the patient was positioned supine. The right neck and chest wall was prepped and draped in a sterile fashion. Ultrasound was placed in a sterile sleeve. Ultrasound was utilized to identify the right internal jugular vein which is noted to be echolucent and compressible indicating patency. Image is recorded for the permanent record. Under direct ultrasound visualization a micro-needle is inserted into the vein followed by the micro-wire. Micro-sheath was then advanced and a J wire is inserted without difficulty under fluoroscopic guidance. Small counterincision was made at the wire insertion site. Dilators are passed over the wire and the tunneled dialysis catheter is fed into the central venous system without difficulty.  Under fluoroscopy the catheter tip positioned at the atrial caval junction. The catheter is then approximated to the chest wall and an exit site selected. 1% lidocaine is infiltrated in soft tissues at  this level small incision is made and the tunneling device is then passed from the exit site to the neck counterincision. Catheter is then connected to the tunneling device and the catheter was pulled subcutaneously. It is then transected and the hub assembly connected without difficulty. Both lumens aspirate and flush easily. After verification of smooth contour with proper tip position under fluoroscopy the catheter is packed with 5000 units of heparin per lumen.  Catheter secured to the skin of the right chest wall with 0 silk. A sterile dressing is applied with a Biopatch.  COMPLICATIONS: None  CONDITION: Good  Hortencia Pilar [name]. Office:  [phone]   07/26/2018,4:57 PM

## 2018-07-27 LAB — HIV ANTIBODY (ROUTINE TESTING W REFLEX): HIV Screen 4th Generation wRfx: NONREACTIVE

## 2018-07-27 LAB — PHOSPHORUS: PHOSPHORUS: 3.5 mg/dL (ref 2.5–4.6)

## 2018-07-27 NOTE — Progress Notes (Signed)
Pre HD assessment    07/27/18 0902  Vital Signs  Temp 97.8 F (36.6 C)  Temp Source Oral  Pulse Rate 65  Pulse Rate Source Monitor  Resp 14  BP 124/88  BP Location Right Arm  BP Method Automatic  Patient Position (if appropriate) Lying  Oxygen Therapy  SpO2 99 %  O2 Device Room Air  Pain Assessment  Pain Scale 0-10  Pain Score 0  Dialysis Weight  Weight 96.7 kg  Type of Weight Pre-Dialysis  Time-Out for Hemodialysis  What Procedure? HD  Pt Identifiers(min of two) First/Last Name;MRN/Account#  Correct Site? Yes  Correct Side? Yes  Correct Procedure? Yes  Consents Verified? Yes  Rad Studies Available? N/A  Safety Precautions Reviewed? Yes  Engineer, civil (consulting) Number  (1A)  Station Number 4  UF/Alarm Test Passed  Conductivity: Meter 14  Conductivity: Machine  14.1  pH 7.6  Reverse Osmosis main  Normal Saline Lot Number [id number]  Dialyzer Lot Number [id number]  Disposable Set Lot Number [id number]  Machine Temperature 98.6 F (37 C)  Musician and Audible Yes  Blood Lines Intact and Secured Yes  Pre Treatment Patient Checks  Vascular access used during treatment Catheter  Hepatitis B Surface Antigen Results  (unk)  Isolation Initiated Yes  Hepatitis B Surface Antibody  (unk)  Date Hepatitis B Surface Antibody Drawn 07/26/18  Hemodialysis Consent Verified Yes  Hemodialysis Standing Orders Initiated Yes  ECG (Telemetry) Monitor On Yes  Prime Ordered Normal Saline  Length of  DialysisTreatment -hour(s) 2.5 Hour(s)  Dialyzer Elisio 17H NR  Dialysate 3K, 2.5 Ca  Dialysis Anticoagulant None  Dialysate Flow Ordered 500  Blood Flow Rate Ordered 250 mL/min  Ultrafiltration Goal 0.5 Liters  Pre Treatment Labs Phosphorus  Dialysis Blood Pressure Support Ordered Normal Saline  Education / Care Plan  Dialysis Education Provided Yes  Documented Education in Care Plan Yes

## 2018-07-27 NOTE — Progress Notes (Signed)
Advanced care plan.  Purpose of the Encounter: CODE STATUS  Parties in Attendance: Patient himself Patient's Decision Capacity: Intact  Subjective/Patient's story: Patient 68 year old with chronic kidney disease who now has end-stage renal disease and discussed with the patient regarding his desires for cardiac and pulmonary resuscitation  Objective/Medical story Patient states that he would like everything to be done would like to be a full code   Goals of care determination:   Full code  CODE STATUS:  Full code  Time spent discussing advanced care planning: 16 minutes

## 2018-07-27 NOTE — Progress Notes (Signed)
Post HD assessment. Pt tolerated tx well without c/o or complication. Net UF 512, goal met. Dressing changed.     07/27/18 1152  Vital Signs  Temp 98.4 F (36.9 C)  Temp Source Oral  Pulse Rate 66  Pulse Rate Source Monitor  Resp 14  BP 105/85  BP Location Right Arm  BP Method Automatic  Patient Position (if appropriate) Lying  Oxygen Therapy  SpO2 98 %  O2 Device Room Air  Dialysis Weight  Weight 96.3 kg  Type of Weight Post-Dialysis  Post-Hemodialysis Assessment  Rinseback Volume (mL) 250 mL  KECN 35.3 V  Dialyzer Clearance Lightly streaked  Duration of HD Treatment -hour(s) 2.5 hour(s)  Hemodialysis Intake (mL) 500 mL  UF Total -Machine (mL) 1012 mL  Net UF (mL) 512 mL  Tolerated HD Treatment Yes  Education / Care Plan  Dialysis Education Provided Yes  Documented Education in Care Plan Yes

## 2018-07-27 NOTE — Progress Notes (Addendum)
[facility] [place] NAME: Anthony Houston    MR#:  [id number]  DATE OF BIRTH:  Mar 29, 1951  SUBJECTIVE:  CHIEF COMPLAINT: Patient had a his PermCath placed yesterday and had a hemodialysis second time today  REVIEW OF SYSTEMS:    CONSTITUTIONAL: No documented fever.  Positive fatigue, positive weakness. No weight gain, no weight loss.  EYES: No blurry or double vision.  ENT: No tinnitus. No postnasal drip. No redness of the oropharynx.  RESPIRATORY: No cough, no wheeze, no hemoptysis. No dyspnea.  CARDIOVASCULAR: No chest pain. No orthopnea. No palpitations. No syncope.  GASTROINTESTINAL: No nausea, no vomiting or diarrhea. No abdominal pain. No melena or hematochezia.  GENITOURINARY:  No urgency. No frequency. No dysuria. No hematuria. No obstructive symptoms. No discharge. No pain. No significant abnormal bleeding ENDOCRINE: No polyuria or nocturia. No heat or cold intolerance.  HEMATOLOGY: No anemia. No bruising. No bleeding. No purpura. No petechiae INTEGUMENTARY: No rashes. No lesions.  MUSCULOSKELETAL: No arthritis. No swelling. No gout.  NEUROLOGIC: No numbness, tingling, or ataxia. No seizure-type activity.  PSYCHIATRIC: No anxiety. No insomnia. No ADD.     DRUG ALLERGIES:   Allergies  Allergen Reactions  . Lisinopril     VITALS:  Blood pressure (!) 133/91, pulse 66, temperature 98.3 F (36.8 C), temperature source Oral, resp. rate 20, height 6' (1.829 m), weight 96.3 kg, SpO2 100 %.  PHYSICAL EXAMINATION:  GENERAL:  68 y.o.-year-old patient lying in the bed with no acute distress.  EYES: Pupils equal, round, reactive to light and accommodation. No scleral icterus. Extraocular muscles intact.  HEENT: Head atraumatic, normocephalic. Oropharynx and nasopharynx clear.  NECK:  Supple, no jugular venous distention. No thyroid enlargement, no tenderness.  LUNGS: Normal breath sounds bilaterally, no wheezing,  rales,rhonchi or crepitation. No use of accessory muscles of respiration.  CARDIOVASCULAR: S1, S2 normal. No murmurs, rubs, or gallops.  ABDOMEN: Soft, nontender, nondistended. Bowel sounds present. EXTREMITIES: No pedal edema, cyanosis, or clubbing.  NEUROLOGIC: Awake and alert. Sensation intact. Gait not checked.  PSYCHIATRIC: The patient is alert and alert SKIN: No obvious rash, lesion, or ulcer.    LABORATORY PANEL:   CBC Recent Labs  Lab 07/26/18 0541  WBC 7.8  HGB 12.2*  HCT 36.9*  PLT 197   ------------------------------------------------------------------------------------------------------------------  Chemistries  Recent Labs  Lab 07/25/18 1559 07/26/18 0541  NA 138 142  K 4.9 4.7  CL 104 109  CO2 21* 19*  GLUCOSE 100* 100*  BUN 71* 76*  CREATININE 8.35* 8.61*  CALCIUM 9.8 9.7  AST 16  --   ALT 9  --   ALKPHOS 53  --   BILITOT 1.0  --    ------------------------------------------------------------------------------------------------------------------  Cardiac Enzymes Recent Labs  Lab 07/25/18 1559  TROPONINI <0.03   ------------------------------------------------------------------------------------------------------------------  RADIOLOGY:  Dg Chest 2 View  Result Date: 07/25/2018 CLINICAL DATA:  Weakness. EXAM: CHEST - 2 VIEW COMPARISON:  03/30/2012. FINDINGS: Mediastinum hilar structures normal. Low lung volumes. No focal alveolar infiltrate. No pleural effusion or pneumothorax. Heart size normal. Diffuse osteopenia degenerative change thoracic spine. IMPRESSION: Low lung volumes.  No acute abnormality. Electronically Signed   By: Marcello Moores  Register   On: 07/25/2018 15:52   Ct Head Wo Contrast  Result Date: 07/25/2018 CLINICAL DATA:  Generalized muscle weakness EXAM: CT HEAD WITHOUT CONTRAST TECHNIQUE: Contiguous axial images were obtained from the base of the skull through the vertex without intravenous contrast. Sagittal and coronal MPR images  reconstructed  from axial data set. COMPARISON:  None FINDINGS: Brain: Generalized atrophy with ex vacuo dilatation of the ventricular system. No midline shift or mass effect. Small vessel chronic ischemic changes of deep cerebral white matter. 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 No intracranial hemorrhage, mass lesion, or evidence of acute infarction. No extra-axial fluid collection. Vascular: Minimal atherosclerotic calcifications of internal carotid arteries at skull base Skull: Demineralized but intact Sinuses/Orbits: Clear Other: N/A IMPRESSION: Atrophy with small vessel chronic ischemic changes of deep cerebral white matter. Old LEFT basal ganglia lacunar infarct. No acute intracranial abnormalities. Electronically Signed   By: Lavonia Dana M.D.   On: 07/25/2018 18:30   US Renal  Result Date: 07/26/2018 CLINICAL DATA:  Stage 5 chronic kidney disease. EXAM: RENAL / URINARY TRACT ULTRASOUND COMPLETE COMPARISON:  Urinary tract ultrasound 03/31/2012. FINDINGS: Right Kidney: Renal measurements: Approximately 9.8 x 5.5 x 5.2 cm = volume: 146 mL. No hydronephrosis. Echogenic parenchyma with mild to moderate diffuse cortical thinning, a new finding since the 2013 examination. Multiple benign cysts as noted previously, the largest arising from the mid kidney measuring approximately 3.6 cm. No solid renal masses. No convincing evidence of nephrolithiasis. Left Kidney: Renal measurements: Approximately 11.4 x 5.0 x 3.7 cm = volume: 110 mL. No hydronephrosis. Echogenic parenchyma with marked diffuse cortical thinning, a new finding since the 2013 that [place]. Multiple benign cysts as noted previously, the largest arising from the mid kidney measuring approximately 4.5 cm. No solid renal masses. No convincing evidence of nephrolithiasis. Bladder: Decompressed. Chronic wall thickening up to 6 mm, unchanged since the 2013 examination. BILATERAL ureteral jets visualized at color Doppler evaluation. Indentation of the  base of the bladder by the prostate gland. IMPRESSION: 1. Echogenic parenchyma and diffuse cortical thinning involving both kidneys indicating chronic renal disease. 2. Multiple BILATERAL renal cysts as noted on the prior ultrasound in 2013. 3. No evidence of hydronephrosis involving either kidney to suggest obstruction as a cause of renal insufficiency. 4. Chronic thickening of the bladder wall, unchanged since the prior ultrasound in 2013, likely chronic outlet obstruction due to prostatomegaly. Electronically Signed   By: Evangeline Dakin M.D.   On: 07/26/2018 10:51    EKG:   Orders placed or performed during the hospital encounter of 07/25/18  . ED EKG  . ED EKG  . EKG 12-Lead  . EKG 12-Lead    ASSESSMENT AND PLAN:    #End-stage renal disease Patient will have third hemodialysis tomorrow once spot is set up he can be discharged   #Generalized weakness probably secondary to uremia We will ask PT to see  #History of multiple myeloma Outpatient follow-up with oncology as recommended  #Essential hypertension- Blood pressure is labile continue to monitor for now  #Dementia-he has some cognitive dysfunction  All the records are reviewed and case discussed with Care Management/Social Workerr. Management plans discussed with the patient, family and they are in agreement.  CODE STATUS: Full  TOTAL TIME TAKING CARE OF THIS PATIENT: 35  minutes.   POSSIBLE D/C IN 2-3  DAYS, DEPENDING ON CLINICAL CONDITION.  Note: This dictation was prepared with Dragon dictation along with smaller phrase technology. Any transcriptional errors that result from this process are unintentional.   Dustin Flock M.D on 07/27/2018 at 2:42 PM  Between 7am to 6pm - Pager - [phone] After 6pm go to [email or web address] - password [email or web address] [facility]  Office  [phone]  CC: Primary care physician; System, Pcp Not In

## 2018-07-27 NOTE — Progress Notes (Signed)
[facility]  ROUNDING NOTE   Subjective:  Patient seen and evaluated during second dialysis treatment. Tolerating well. Patient will likely be admitted to [facility].    Objective:  Vital signs in last 24 hours:  Temp:  [97.5 F (36.4 C)-98.4 F (36.9 C)] 98.4 F (36.9 C) (01/15 1152) Pulse Rate:  [59-78] 67 (01/15 1154) Resp:  [10-28] 13 (01/15 1154) BP: (88-137)/(73-95) 105/85 (01/15 1152) SpO2:  [97 %-100 %] 98 % (01/15 1152) Weight:  [92.4 kg-97.1 kg] 96.3 kg (01/15 1152)  Weight change: 10.8 kg Filed Weights   07/27/18 0902 07/27/18 1146 07/27/18 1152  Weight: 96.7 kg 96.3 kg 96.3 kg    Intake/Output: I/O last 3 completed shifts: In: 240 [P.O.:240] Out: 18 [Other:18]   Intake/Output this shift:  Total I/O In: -  Out: 512 [Other:512]  Physical Exam: General: No acute distress  Head: Normocephalic, atraumatic. Moist oral mucosal membranes  Eyes: Anicteric  Neck: Supple, trachea midline  Lungs:  Clear to auscultation, normal effort  Heart: S1S2 no rubs  Abdomen:  Soft, nontender, bowel sounds present  Extremities: Trace peripheral edema.  Neurologic: Awake, alert, following commands  Skin: No lesions  Access: R IJ permcath    Basic Metabolic Panel: Recent Labs  Lab 07/25/18 1559 07/26/18 0541 07/26/18 1904 07/27/18 0959  NA 138 142  --   --   K 4.9 4.7  --   --   CL 104 109  --   --   CO2 21* 19*  --   --   GLUCOSE 100* 100*  --   --   BUN 71* 76*  --   --   CREATININE 8.35* 8.61*  --   --   CALCIUM 9.8 9.7  --   --   PHOS  --   --  4.9* 3.5    Liver Function Tests: Recent Labs  Lab 07/25/18 1559  AST 16  ALT 9  ALKPHOS 53  BILITOT 1.0  PROT 8.5*  ALBUMIN 4.2   No results for input(s): LIPASE, AMYLASE in the last 168 hours. No results for input(s): AMMONIA in the last 168 hours.  CBC: Recent Labs  Lab 07/25/18 1559 07/26/18 0541  WBC 7.0 7.8  HGB 13.5 12.2*  HCT 41.9 36.9*  MCV 85.2 82.0  PLT 276  197    Cardiac Enzymes: Recent Labs  Lab 07/25/18 1559  TROPONINI <0.03    BNP: Invalid input(s): POCBNP  CBG: No results for input(s): GLUCAP in the last 168 hours.  Microbiology: Results for orders placed or performed during the hospital encounter of 07/25/18  MRSA PCR Screening     Status: None   Collection Time: 07/26/18  5:40 AM  Result Value Ref Range Status   MRSA by PCR NEGATIVE NEGATIVE Final    Comment:        The GeneXpert MRSA Assay (FDA approved for NASAL specimens only), is one component of a comprehensive MRSA colonization surveillance program. It is not intended to diagnose MRSA infection nor to guide or monitor treatment for MRSA infections. Performed at [facility], [address] [place], [place] [address]     Coagulation Studies: No results for input(s): LABPROT, INR in the last 72 hours.  Urinalysis: No results for input(s): COLORURINE, LABSPEC, PHURINE, GLUCOSEU, HGBUR, BILIRUBINUR, KETONESUR, PROTEINUR, UROBILINOGEN, NITRITE, LEUKOCYTESUR in the last 72 hours.  Invalid input(s): APPERANCEUR    Imaging: Dg Chest 2 View  Result Date: 07/25/2018 CLINICAL DATA:  Weakness. EXAM:   CHEST - 2 VIEW COMPARISON:  03/30/2012. FINDINGS: Mediastinum hilar structures normal. Low lung volumes. No focal alveolar infiltrate. No pleural effusion or pneumothorax. Heart size normal. Diffuse osteopenia degenerative change thoracic spine. IMPRESSION: Low lung volumes.  No acute abnormality. Electronically Signed   By: Marcello Moores  Register   On: 07/25/2018 15:52   Ct Head Wo Contrast  Result Date: 07/25/2018 CLINICAL DATA:  Generalized muscle weakness EXAM: CT HEAD WITHOUT CONTRAST TECHNIQUE: Contiguous axial images were obtained from the base of the skull through the vertex without intravenous contrast. Sagittal and coronal MPR images reconstructed from axial data set. COMPARISON:  None FINDINGS: Brain: Generalized atrophy with ex vacuo dilatation of the  ventricular system. No midline shift or mass effect. Small vessel chronic ischemic changes of deep cerebral white matter. 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 No intracranial hemorrhage, mass lesion, or evidence of acute infarction. No extra-axial fluid collection. Vascular: Minimal atherosclerotic calcifications of internal carotid arteries at skull base Skull: Demineralized but intact Sinuses/Orbits: Clear Other: N/A IMPRESSION: Atrophy with small vessel chronic ischemic changes of deep cerebral white matter. Old LEFT basal ganglia lacunar infarct. No acute intracranial abnormalities. Electronically Signed   By: Lavonia Dana M.D.   On: 07/25/2018 18:30   US Renal  Result Date: 07/26/2018 CLINICAL DATA:  Stage 5 chronic kidney disease. EXAM: RENAL / URINARY TRACT ULTRASOUND COMPLETE COMPARISON:  Urinary tract ultrasound 03/31/2012. FINDINGS: Right Kidney: Renal measurements: Approximately 9.8 x 5.5 x 5.2 cm = volume: 146 mL. No hydronephrosis. Echogenic parenchyma with mild to moderate diffuse cortical thinning, a new finding since the 2013 examination. Multiple benign cysts as noted previously, the largest arising from the mid kidney measuring approximately 3.6 cm. No solid renal masses. No convincing evidence of nephrolithiasis. Left Kidney: Renal measurements: Approximately 11.4 x 5.0 x 3.7 cm = volume: 110 mL. No hydronephrosis. Echogenic parenchyma with marked diffuse cortical thinning, a new finding since the 2013 that [place]. Multiple benign cysts as noted previously, the largest arising from the mid kidney measuring approximately 4.5 cm. No solid renal masses. No convincing evidence of nephrolithiasis. Bladder: Decompressed. Chronic wall thickening up to 6 mm, unchanged since the 2013 examination. BILATERAL ureteral jets visualized at color Doppler evaluation. Indentation of the base of the bladder by the prostate gland. IMPRESSION: 1. Echogenic parenchyma and diffuse cortical thinning involving  both kidneys indicating chronic renal disease. 2. Multiple BILATERAL renal cysts as noted on the prior ultrasound in 2013. 3. No evidence of hydronephrosis involving either kidney to suggest obstruction as a cause of renal insufficiency. 4. Chronic thickening of the bladder wall, unchanged since the prior ultrasound in 2013, likely chronic outlet obstruction due to prostatomegaly. Electronically Signed   By: Evangeline Dakin M.D.   On: 07/26/2018 10:51     Medications:   . sodium chloride    . sodium chloride     . Chlorhexidine Gluconate Cloth  6 each Topical Q0600  . heparin  5,000 Units Subcutaneous Q8H  . Influenza vac split quadrivalent PF  0.5 mL Intramuscular Tomorrow-1000  . tuberculin  5 Units Intradermal Once   sodium chloride, sodium chloride, acetaminophen **OR** acetaminophen, alteplase, heparin, HYDROmorphone (DILAUDID) injection, lidocaine (PF), lidocaine-prilocaine, ondansetron **OR** ondansetron (ZOFRAN) IV, ondansetron (ZOFRAN) IV, pentafluoroprop-tetrafluoroeth  Assessment/ Plan:  68 y.o. male with a PMHx of chronic kidney disease stage V, coronary disease, cognitive disorder, dementia, hypertension, history of multiple myeloma, who was admitted to [facility] on 07/25/2018 for evaluation of increasing weakness.  1.  End stage renal disease:  At this point and [facility]  On 06/02/18 his creatinine was 6.2 with a BUN of 57.   -Patient seen and evaluated during hemodialysis.  Tolerating well.  Third dialysis treatment tomorrow.  2.  Anemia chronic kidney disease.  No indication for Procrit.  Hemoglobin 12.2.  3.  Secondary hyperparathyroidism.  Phosphorus down to 3.5 today.  No indication for binders at the moment.   LOS: 1   1/15/202012:15 PM   

## 2018-07-27 NOTE — Clinical Social Work Note (Signed)
Clinical Social Work Assessment  Patient Details  Name: Anthony Houston MRN: [id number] Date of Birth: 04/27/1951  Date of referral:  07/27/18               Reason for consult:  Discharge Planning                Permission sought to share information with:    Permission granted to share information::     Name::        Agency::     Relationship::     Contact Information:     Housing/Transportation Living arrangements for the past 2 months:  [place] Information:  Patient, Facility Patient Interpreter Needed:  None Criminal Activity/Legal Involvement Pertinent to Current Situation/Hospitalization:  No - Comment as needed Significant Relationships:  Siblings Lives with:  Facility Resident Do you feel safe going back to the place where you live?  Yes Need for family participation in patient care:  No (Coment)  Care giving concerns:  Patient is a long term resident of [facility].   Social Worker assessment / plan:  CSW spoke with Rachel Bo, who is [employer] (medical), this morning. Rachel Bo states that patient can return at time of discharge. CSW spoke with the patient who states he has been at [facility] for about a year ad that he is adjusting well. He wishes to return to [facility] when it is time.  Employment status:    Insurance information:    PT Recommendations:    Information / Referral to community resources:     Patient/Family's Response to care:  Patient expressed appreciation for CSW assistance.  Patient/Family's Understanding of and Emotional Response to Diagnosis, Current Treatment, and Prognosis:  Patient verbalized that he is feeling well.  Emotional Assessment Appearance:  Appears stated age Attitude/Demeanor/Rapport:  (pleasant and cooperative) Affect (typically observed):  Accepting, Calm Orientation:  Oriented to Self, Oriented to Place, Oriented to Situation Alcohol / Substance use:  Not Applicable Psych involvement (Current and  /or in the community):  No (Comment)  Discharge Needs  Concerns to be addressed:  Care Coordination Readmission within the last 30 days:  No Current discharge risk:  None Barriers to Discharge:      Shela Leff, [place] 07/27/2018, 2:42 PM

## 2018-07-27 NOTE — Progress Notes (Signed)
HD tx start    07/27/18 0910  Vital Signs  Pulse Rate 65  Pulse Rate Source Monitor  Resp 15  BP 124/80  BP Location Right Arm  BP Method Automatic  Patient Position (if appropriate) Lying  Oxygen Therapy  SpO2 99 %  O2 Device Room Air  During Hemodialysis Assessment  Blood Flow Rate (mL/min) 250 mL/min  Arterial Pressure (mmHg) -100 mmHg  Venous Pressure (mmHg) 60 mmHg  Transmembrane Pressure (mmHg) 60 mmHg  Ultrafiltration Rate (mL/min) 400 mL/min  Dialysate Flow Rate (mL/min) 500 ml/min  Conductivity: Machine  14.1  HD Safety Checks Performed Yes  Dialysis Fluid Bolus Normal Saline  Bolus Amount (mL) 250 mL  Intra-Hemodialysis Comments Tx initiated

## 2018-07-27 NOTE — Progress Notes (Signed)
Pre HD assessment    07/27/18 0903  Neurological  Level of Consciousness Alert  Orientation Level Oriented X4  Respiratory  Respiratory Pattern Regular;Unlabored  Chest Assessment Chest expansion symmetrical  Cardiac  Pulse Regular  ECG Monitor Yes  Cardiac Rhythm NSR  Vascular  R Radial Pulse +2  L Radial Pulse +2  Edema Generalized  Integumentary  Integumentary (WDL) X  Skin Color Appropriate for ethnicity  Musculoskeletal  Musculoskeletal (WDL) X  Generalized Weakness Yes  Assistive Device None  GU Assessment  Genitourinary (WDL) X  Genitourinary Symptoms  (HD)  Psychosocial  Psychosocial (WDL) WDL

## 2018-07-27 NOTE — Progress Notes (Signed)
Post HD assessment    07/27/18 1151  Neurological  Level of Consciousness Alert  Orientation Level Oriented X4  Respiratory  Respiratory Pattern Regular;Unlabored  Chest Assessment Chest expansion symmetrical  Cardiac  Pulse Regular  ECG Monitor Yes  Cardiac Rhythm NSR  Vascular  R Radial Pulse +2  L Radial Pulse +2  Edema Generalized  Integumentary  Integumentary (WDL) X  Skin Color Appropriate for ethnicity  Musculoskeletal  Musculoskeletal (WDL) X  Generalized Weakness Yes  Assistive Device None  GU Assessment  Genitourinary (WDL) X  Genitourinary Symptoms  (HD)  Psychosocial  Psychosocial (WDL) WDL

## 2018-07-27 NOTE — Progress Notes (Signed)
HD tx end    07/27/18 1146  Vital Signs  Pulse Rate 69  Pulse Rate Source Monitor  Resp 16  BP 104/78  BP Location Right Arm  BP Method Automatic  Patient Position (if appropriate) Lying  Oxygen Therapy  SpO2 98 %  O2 Device Room Air  Dialysis Weight  Weight 96.3 kg  Type of Weight Post-Dialysis  During Hemodialysis Assessment  Dialysis Fluid Bolus Normal Saline  Bolus Amount (mL) 250 mL  Intra-Hemodialysis Comments Tx completed

## 2018-07-28 LAB — HEPATITIS B SURFACE ANTIBODY, QUANTITATIVE: Hep B S AB Quant (Post): <3.1 m[IU]/mL — ABNORMAL LOW (ref >9.9)

## 2018-07-28 LAB — HEPATITIS B SURFACE ANTIBODY,QUALITATIVE: Hep B S Ab: NONREACTIVE

## 2018-07-28 LAB — HEPATITIS B CORE ANTIBODY, TOTAL: HEP B C TOTAL AB: POSITIVE — AB

## 2018-07-28 LAB — PARATHYROID HORMONE, INTACT (NO CA): PTH: 101 pg/mL — ABNORMAL HIGH (ref 15–65)

## 2018-07-28 NOTE — Progress Notes (Signed)
Pre HD Assessment    07/28/18 0930  Neurological  Level of Consciousness Alert  Orientation Level Oriented X4  Respiratory  Respiratory Pattern Regular;Unlabored  Bilateral Breath Sounds Clear;Diminished  Cough None  Cardiac  Pulse Regular  Heart Sounds S1, S2  ECG Monitor Yes  Cardiac Rhythm NSR  Antiarrhythmic device No  Vascular  R Radial Pulse +2  L Radial Pulse +2  Edema Generalized  Integumentary  Integumentary (WDL) X  Skin Color Appropriate for ethnicity  Skin Condition Flaky;Dry  Skin Integrity Other (Comment) (scab on right eye, discoloration noted on BLE)  Musculoskeletal  Musculoskeletal (WDL) X  Generalized Weakness Yes  GU Assessment  Genitourinary (WDL) X  Genitourinary Symptoms Oliguria (HD)  Psychosocial  Psychosocial (WDL) WDL

## 2018-07-28 NOTE — Evaluation (Signed)
Physical Therapy Evaluation Patient Details Name: Anthony Houston MRN: [id number] DOB: 06/24/1951 Today's Date: 07/28/2018   History of Present Illness  68 y.o. male with a PMHx of chronic kidney disease stage V, coronary disease, cognitive disorder, dementia, hypertension, history of multiple myeloma, who was admitted to [facility] on 07/25/2018 for evaluation of increasing weakness.  tunneled dialysis catheter right IJ approach 07/26/2018.  Clinical Impression  Patient easily woken at start of session, oriented to self, able to state correct month, behavior WFLs throughout session. Per chart and patient, lives at [facility] and has been there for about 1 year. Pt reported normally being able to walk very short distances with a cane, or uses a WC. Reported being able to dress/bathe independently. Pt questionable historian.  Patient demonstrated bed mobility mod I and verbal cues to scoot to EOB. BP assessed, 100/72. minAx1 for sit<>stand, especially for eccentric lowering to sit in chair. MinAx1 with RW to take small, shuffling steps to chair, difficulty noted with knee extension bilaterally.  Overall the patient demonstrated deficits (see "PT Problem List") that impede the patient's functional abilities, safety, and mobility and would benefit from skilled PT intervention. Recommendation is HHPT (resides in ALF), with supervision for mobility/OOB.      Follow Up Recommendations Home health PT;Supervision for mobility/OOB    Equipment Recommendations  Rolling walker with 5" wheels;Other (comment)(RW if patient does not have one.)    Recommendations for Other Services       Precautions / Restrictions Precautions Precautions: Fall Precaution Comments: s/p permcath placement Restrictions Weight Bearing Restrictions: No      Mobility  Bed Mobility Overal bed mobility: Modified Independent             General bed mobility comments: use of bed rails, verbal cues  Transfers Overall  transfer level: Needs assistance Equipment used: Rolling walker (2 wheeled) Transfers: Sit to/from Stand Sit to Stand: Min assist            Ambulation/Gait Ambulation/Gait assistance: Min Web designer (Feet): 3 Feet Assistive device: Rolling walker (2 wheeled)       General Gait Details: decreased gait velocity, flexed trunk, difficulty with TKE bilaterally, shuffling steps noted  Stairs            Wheelchair Mobility    Modified Rankin (Stroke Patients Only)       Balance Overall balance assessment: Needs assistance   Sitting balance-Leahy Scale: Fair       Standing balance-Leahy Scale: Poor                               Pertinent Vitals/Pain Pain Assessment: No/denies pain    Home Living Family/patient expects to be discharged to:: Assisted living               Home Equipment: Wheelchair - manual;Walker - 2 wheels      Prior Function           Comments: Pt reported he is able to dress and bathe himself (sponge baths). Reported ambulating very short distances with use of SPC or he is in the [facility]. Pt questionable historian.     Hand Dominance   Dominant Hand: Right    Extremity/Trunk Assessment   Upper Extremity Assessment Upper Extremity Assessment: Generalized weakness    Lower Extremity Assessment Lower Extremity Assessment: Generalized weakness       Communication   Communication: No difficulties  Cognition Arousal/Alertness: Awake/alert  Behavior During Therapy: WFL for tasks assessed/performed Overall Cognitive Status: No family/caregiver present to determine baseline cognitive functioning                                 General Comments: Pt oriented to self      General Comments      Exercises     Assessment/Plan    PT Assessment Patient needs continued PT services  PT Problem List Decreased strength;Decreased knowledge of use of DME;Decreased activity tolerance;Decreased  balance;Decreased mobility;Decreased safety awareness;Decreased knowledge of precautions       PT Treatment Interventions DME instruction;Therapeutic exercise;Gait training;Balance training;Stair training;Neuromuscular re-education;Therapeutic activities;Patient/family education    PT Goals (Current goals can be found in the Care Plan section)  Acute Rehab PT Goals Patient Stated Goal: Pt wants to get better PT Goal Formulation: With patient Time For Goal Achievement: 08/11/18 Potential to Achieve Goals: Good    Frequency Min 2X/week   Barriers to discharge        Co-evaluation               AM-PAC PT "6 Clicks" Mobility  Outcome Measure Help needed turning from your back to your side while in a flat bed without using bedrails?: A Little Help needed moving from lying on your back to sitting on the side of a flat bed without using bedrails?: A Little Help needed moving to and from a bed to a chair (including a wheelchair)?: A Little Help needed standing up from a chair using your arms (e.g., wheelchair or bedside chair)?: A Little Help needed to walk in hospital room?: A Little Help needed climbing 3-5 steps with a railing? : Total 6 Click Score: 16    End of Session Equipment Utilized During Treatment: Gait belt Activity Tolerance: Patient tolerated treatment well Patient left: with chair alarm set;in chair Nurse Communication: Mobility status PT Visit Diagnosis: Other abnormalities of gait and mobility (R26.89);Difficulty in walking, not elsewhere classified (R26.2);Muscle weakness (generalized) (M62.81)    Time: 5397-6734 PT Time Calculation (min) (ACUTE ONLY): 21 min   Charges:   PT Evaluation $PT Eval Low Complexity: 1 Low         Lieutenant Diego PT, DPT 10:19 AM,07/28/18 [phone]

## 2018-07-28 NOTE — Progress Notes (Signed)
Post HD Tx    07/28/18 1300  Hand-Off documentation  Report given to (Full Name) Pamella Pert, RN   Report received from (Full Name) Beatris Ship, RN   Vital Signs  Temp 98.2 F (36.8 C)  Temp Source Oral  Pulse Rate 65  Pulse Rate Source Monitor  Resp 18  BP 110/70  BP Location Right Arm  BP Method Automatic  Patient Position (if appropriate) Sitting  Oxygen Therapy  SpO2 100 %  O2 Device Room Air  Pulse Oximetry Type Continuous  Pain Assessment  Pain Scale 0-10  Pain Score 0  Dialysis Weight  Weight  (unable to weigh pt in recliner )  Type of Weight Post-Dialysis  Post-Hemodialysis Assessment  Rinseback Volume (mL) 250 mL  KECN 33.3 V  Dialyzer Clearance Lightly streaked  Duration of HD Treatment -hour(s) 3 hour(s)  Hemodialysis Intake (mL) 500 mL  UF Total -Machine (mL) 1504 mL  Net UF (mL) 1004 mL  Tolerated HD Treatment Yes  Hemodialysis Catheter Right Temporary;Permanent  Placement Date/Time: 07/26/18 1300   Orientation: Right  Hemodialysis Catheter Type: Temporary;Permanent  Site Condition Other (Comment) (Did not run at Rx BFR while sitting, may need intervention )  Blue Lumen Status Heparin locked  Red Lumen Status Heparin locked  Catheter fill solution Heparin 1000 units/ml  Post treatment catheter status Capped and Clamped

## 2018-07-28 NOTE — Progress Notes (Signed)
[place] [facility] [place] NAME: Quintavis Brands    MR#:  [id number]  DATE OF BIRTH:  12-07-1950  SUBJECTIVE:  CHIEF COMPLAINT:  Patient denying any complaints currently  REVIEW OF SYSTEMS:    CONSTITUTIONAL: No documented fever.  Positive fatigue, positive weakness. No weight gain, no weight loss.  EYES: No blurry or double vision.  ENT: No tinnitus. No postnasal drip. No redness of the oropharynx.  RESPIRATORY: No cough, no wheeze, no hemoptysis. No dyspnea.  CARDIOVASCULAR: No chest pain. No orthopnea. No palpitations. No syncope.  GASTROINTESTINAL: No nausea, no vomiting or diarrhea. No abdominal pain. No melena or hematochezia.  GENITOURINARY:  No urgency. No frequency. No dysuria. No hematuria. No obstructive symptoms. No discharge. No pain. No significant abnormal bleeding ENDOCRINE: No polyuria or nocturia. No heat or cold intolerance.  HEMATOLOGY: No anemia. No bruising. No bleeding. No purpura. No petechiae INTEGUMENTARY: No rashes. No lesions.  MUSCULOSKELETAL: No arthritis. No swelling. No gout.  NEUROLOGIC: No numbness, tingling, or ataxia. No seizure-type activity.  PSYCHIATRIC: No anxiety. No insomnia. No ADD.     DRUG ALLERGIES:   Allergies  Allergen Reactions  . Lisinopril     VITALS:  Blood pressure 110/70, pulse 65, temperature 98.2 F (36.8 C), temperature source Oral, resp. rate 18, height 6' (1.829 m), weight 96.3 kg, SpO2 100 %.  PHYSICAL EXAMINATION:  GENERAL:  68 y.o.-year-old patient lying in the bed with no acute distress.  EYES: Pupils equal, round, reactive to light and accommodation. No scleral icterus. Extraocular muscles intact.  HEENT: Head atraumatic, normocephalic. Oropharynx and nasopharynx clear.  NECK:  Supple, no jugular venous distention. No thyroid enlargement, no tenderness.  LUNGS: Normal breath sounds bilaterally, no wheezing, rales,rhonchi or crepitation. No use of accessory muscles  of respiration.  CARDIOVASCULAR: S1, S2 normal. No murmurs, rubs, or gallops.  ABDOMEN: Soft, nontender, nondistended. Bowel sounds present. EXTREMITIES: No pedal edema, cyanosis, or clubbing.  NEUROLOGIC: Awake and alert. Sensation intact. Gait not checked.  PSYCHIATRIC: The patient is alert and alert SKIN: No obvious rash, lesion, or ulcer.    LABORATORY PANEL:   CBC Recent Labs  Lab 07/26/18 0541  WBC 7.8  HGB 12.2*  HCT 36.9*  PLT 197   ------------------------------------------------------------------------------------------------------------------  Chemistries  Recent Labs  Lab 07/25/18 1559 07/26/18 0541  NA 138 142  K 4.9 4.7  CL 104 109  CO2 21* 19*  GLUCOSE 100* 100*  BUN 71* 76*  CREATININE 8.35* 8.61*  CALCIUM 9.8 9.7  AST 16  --   ALT 9  --   ALKPHOS 53  --   BILITOT 1.0  --    ------------------------------------------------------------------------------------------------------------------  Cardiac Enzymes Recent Labs  Lab 07/25/18 1559  TROPONINI <0.03   ------------------------------------------------------------------------------------------------------------------  RADIOLOGY:  No results found.  EKG:   Orders placed or performed during the hospital encounter of 07/25/18  . ED EKG  . ED EKG  . EKG 12-Lead  . EKG 12-Lead    ASSESSMENT AND PLAN:    #End-stage renal disease He had third hemodialysis today tolerated well He is awaiting spot for hemodialysis as outpatient  #Generalized weakness probably secondary to uremia And by PT recommended outpatient PT   #History of multiple myeloma Outpatient follow-up with oncology as recommended  #Essential hypertension- Blood pressure is labile continue to monitor for now  #Dementia-he has some cognitive dysfunction  All the records are reviewed and case discussed with Care Management/Social Workerr. Management plans discussed with the patient,  family and they are in  agreement.  CODE STATUS: Full  TOTAL TIME TAKING CARE OF THIS PATIENT: 32 minutes.   POSSIBLE D/C IN 2-3  DAYS, DEPENDING ON CLINICAL CONDITION.  Note: This dictation was prepared with Dragon dictation along with smaller phrase technology. Any transcriptional errors that result from this process are unintentional.   Dustin Flock M.D on 07/28/2018 at 2:34 PM  Between 7am to 6pm - Pager - [phone] After 6pm go to [email or web address] - password [email or web address] [facility]  Office  [phone]  CC: Primary care physician; System, Pcp Not In

## 2018-07-28 NOTE — Progress Notes (Signed)
HD TX completed, tolerated well, unable to run CVC at prescribed BFR, may need intervention, MD aware.    07/28/18 1245  Vital Signs  Pulse Rate 66  Pulse Rate Source Monitor  Resp 14  BP 101/68  BP Location Right Arm  BP Method Automatic  Patient Position (if appropriate) Sitting  Oxygen Therapy  SpO2 99 %  O2 Device Room Air  Pulse Oximetry Type Continuous  During Hemodialysis Assessment  HD Safety Checks Performed Yes  KECN 33.3 KECN  Dialysis Fluid Bolus Normal Saline  Bolus Amount (mL) 250 mL  Intra-Hemodialysis Comments Tx completed;Tolerated well

## 2018-07-28 NOTE — Progress Notes (Signed)
[facility]  ROUNDING NOTE   Subjective:  Patient seen in the ventilator during hemodialysis. Tolerating well. Adjusting well to dialysis in general.   Objective:  Vital signs in last 24 hours:  Temp:  [98.4 F (36.9 C)-98.6 F (37 C)] 98.4 F (36.9 C) (01/16 0943) Pulse Rate:  [61-85] 62 (01/16 1130) Resp:  [13-18] 13 (01/16 1130) BP: (89-117)/(67-92) 117/92 (01/16 1130) SpO2:  [95 %-100 %] 98 % (01/16 1130)  Weight change: 4.3 kg Filed Weights   07/27/18 0902 07/27/18 1146 07/27/18 1152  Weight: 96.7 kg 96.3 kg 96.3 kg    Intake/Output: I/O last 3 completed shifts: In: 480 [P.O.:480] Out: 1130 [Urine:600; Other:530]   Intake/Output this shift:  No intake/output data recorded.  Physical Exam: General: No acute distress  Head: Normocephalic, atraumatic. Moist oral mucosal membranes  Eyes: Anicteric  Neck: Supple, trachea midline  Lungs:  Clear to auscultation, normal effort  Heart: S1S2 no rubs  Abdomen:  Soft, nontender, bowel sounds present  Extremities: Trace peripheral edema.  Neurologic: Awake, alert, following commands  Skin: No lesions  Access: R IJ permcath    Basic Metabolic Panel: Recent Labs  Lab 07/25/18 1559 07/26/18 0541 07/26/18 1904 07/27/18 0959  NA 138 142  --   --   K 4.9 4.7  --   --   CL 104 109  --   --   CO2 21* 19*  --   --   GLUCOSE 100* 100*  --   --   BUN 71* 76*  --   --   CREATININE 8.35* 8.61*  --   --   CALCIUM 9.8 9.7  --   --   PHOS  --   --  4.9* 3.5    Liver Function Tests: Recent Labs  Lab 07/25/18 1559  AST 16  ALT 9  ALKPHOS 53  BILITOT 1.0  PROT 8.5*  ALBUMIN 4.2   No results for input(s): LIPASE, AMYLASE in the last 168 hours. No results for input(s): AMMONIA in the last 168 hours.  CBC: Recent Labs  Lab 07/25/18 1559 07/26/18 0541  WBC 7.0 7.8  HGB 13.5 12.2*  HCT 41.9 36.9*  MCV 85.2 82.0  PLT 276 197    Cardiac Enzymes: Recent Labs  Lab 07/25/18 1559  TROPONINI  <0.03    BNP: Invalid input(s): POCBNP  CBG: No results for input(s): GLUCAP in the last 168 hours.  Microbiology: Results for orders placed or performed during the hospital encounter of 07/25/18  MRSA PCR Screening     Status: None   Collection Time: 07/26/18  5:40 AM  Result Value Ref Range Status   MRSA by PCR NEGATIVE NEGATIVE Final    Comment:        The GeneXpert MRSA Assay (FDA approved for NASAL specimens only), is one component of a comprehensive MRSA colonization surveillance program. It is not intended to diagnose MRSA infection nor to guide or monitor treatment for MRSA infections. Performed at [facility], [address] [place], [place] [address]     Coagulation Studies: No results for input(s): LABPROT, INR in the last 72 hours.  Urinalysis: No results for input(s): COLORURINE, LABSPEC, PHURINE, GLUCOSEU, HGBUR, BILIRUBINUR, KETONESUR, PROTEINUR, UROBILINOGEN, NITRITE, LEUKOCYTESUR in the last 72 hours.  Invalid input(s): APPERANCEUR    Imaging: No results found.   Medications:   . sodium chloride    . sodium chloride     . Chlorhexidine Gluconate Cloth  6 each Topical Q0600  .  heparin  5,000 Units Subcutaneous Q8H  . Influenza vac split quadrivalent PF  0.5 mL Intramuscular Tomorrow-1000   sodium chloride, sodium chloride, acetaminophen **OR** acetaminophen, alteplase, heparin, HYDROmorphone (DILAUDID) injection, lidocaine (PF), lidocaine-prilocaine, ondansetron **OR** ondansetron (ZOFRAN) IV, ondansetron (ZOFRAN) IV, pentafluoroprop-tetrafluoroeth  Assessment/ Plan:  68 y.o. male with a PMHx of chronic kidney disease stage V, coronary disease, cognitive disorder, dementia, hypertension, history of multiple myeloma, who was admitted to [facility] on 07/25/2018 for evaluation of increasing weakness.  1.  End stage renal disease:   At this point and we don't have access to his labs at the [place] was in contact with the [place].   On 06/02/18 his creatinine was 6.2 with a BUN of 57.   -patient seen and evaluated during dialysis treatment and tolerating quite well.  We plan to complete treatment today.  Outpatient dialysis Center placement pending.  2.  Anemia chronic kidney disease.  Continue to monitor CBC periodically.  No indication for Procrit at the moment.  3.  Secondary hyperparathyroidism.  Phosphorus under good control.  No indication for phosphorus binders.   LOS: 2 Kendricks Reap 1/16/20201:07 PM

## 2018-07-28 NOTE — Progress Notes (Signed)
HD Tx started w/o complication   85/46/27 0350  Vital Signs  Temp 98.4 F (36.9 C)  Temp Source Oral  Pulse Rate 66  Pulse Rate Source Monitor  Resp 13  BP 114/71  BP Location Right Arm  BP Method Automatic  Patient Position (if appropriate) Lying  Oxygen Therapy  SpO2 100 %  O2 Device Room Air  Pain Assessment  Pain Scale 0-10  Pain Score 0  Dialysis Weight  Weight  (unable to weigh pt in chair )  Type of Weight Pre-Dialysis  Time-Out for Hemodialysis  What Procedure? HD  Pt Identifiers(min of two) First/Last Name;MRN/Account#  Correct Site? Yes  Correct Side? Yes  Correct Procedure? Yes  Consents Verified? Yes  Rad Studies Available? N/A  Safety Precautions Reviewed? Yes  Engineer, civil (consulting) Number 5  Station Number 3  UF/Alarm Test Passed  Conductivity: Meter 14  Conductivity: Machine  14  pH 7.4  Reverse Osmosis Main  Normal Saline Lot Number [id number]  Dialyzer Lot Number [id number]  Disposable Set Lot Number [id number]  Machine Temperature 98.6 F (37 C)  Musician and Audible Yes  Blood Lines Intact and Secured Yes  Pre Treatment Patient Checks  Vascular access used during treatment Catheter  Patient is receiving dialysis in a chair Yes  Hepatitis B Surface Antigen Results  (unk)  Date Hepatitis B Surface Antigen Drawn 07/26/18  Isolation Initiated Yes  Hepatitis B Surface Antibody  (,10)  Date Hepatitis B Surface Antibody Drawn 07/26/18  Hemodialysis Consent Verified Yes  Hemodialysis Standing Orders Initiated Yes  ECG (Telemetry) Monitor On Yes  Prime Ordered Normal Saline  Length of  DialysisTreatment -hour(s) 3 Hour(s)  Dialysis Treatment Comments Na 140  Dialyzer Elisio 17H NR  Dialysate 3K, 2.5 Ca  Dialysis Anticoagulant None  Dialysate Flow Ordered 600  Blood Flow Rate Ordered 300 mL/min  Ultrafiltration Goal 1 Liters  Pre Treatment Labs Phosphorus  Dialysis Blood Pressure Support Ordered Normal Saline  During Hemodialysis  Assessment  Blood Flow Rate (mL/min) 300 mL/min  Arterial Pressure (mmHg) -110 mmHg  Venous Pressure (mmHg) 90 mmHg  Transmembrane Pressure (mmHg) 60 mmHg  Ultrafiltration Rate (mL/min) 500 mL/min  Dialysate Flow Rate (mL/min) 600 ml/min  Conductivity: Machine  14  HD Safety Checks Performed Yes  Dialysis Fluid Bolus Normal Saline  Bolus Amount (mL) 250 mL  Intra-Hemodialysis Comments Tx initiated

## 2018-07-29 NOTE — Progress Notes (Signed)
[place] at [place] NAME: Anthony Houston    MR#:  [id number]  DATE OF BIRTH:  [birth year]  SUBJECTIVE:  CHIEF COMPLAINT:  Patient awaiting spot for hemodialysis for outpatient  REVIEW OF SYSTEMS:    CONSTITUTIONAL: No documented fever.  Positive fatigue, positive weakness. No weight gain, no weight loss.  EYES: No blurry or double vision.  ENT: No tinnitus. No postnasal drip. No redness of the oropharynx.  RESPIRATORY: No cough, no wheeze, no hemoptysis. No dyspnea.  CARDIOVASCULAR: No chest pain. No orthopnea. No palpitations. No syncope.  GASTROINTESTINAL: No nausea, no vomiting or diarrhea. No abdominal pain. No melena or hematochezia.  GENITOURINARY:  No urgency. No frequency. No dysuria. No hematuria. No obstructive symptoms. No discharge. No pain. No significant abnormal bleeding ENDOCRINE: No polyuria or nocturia. No heat or cold intolerance.  HEMATOLOGY: No anemia. No bruising. No bleeding. No purpura. No petechiae INTEGUMENTARY: No rashes. No lesions.  MUSCULOSKELETAL: No arthritis. No swelling. No gout.  NEUROLOGIC: No numbness, tingling, or ataxia. No seizure-type activity.  PSYCHIATRIC: No anxiety. No insomnia. No ADD.     DRUG ALLERGIES:   Allergies  Allergen Reactions  . Lisinopril     VITALS:  Blood pressure 109/69, pulse 71, temperature 98.3 F (36.8 C), temperature source Oral, resp. rate 20, height 6' (1.829 m), weight 96.3 kg, SpO2 99 %.  PHYSICAL EXAMINATION:  GENERAL:  68 y.o.-year-old patient lying in the bed with no acute distress.  EYES: Pupils equal, round, reactive to light and accommodation. No scleral icterus. Extraocular muscles intact.  HEENT: Head atraumatic, normocephalic. Oropharynx and nasopharynx clear.  NECK:  Supple, no jugular venous distention. No thyroid enlargement, no tenderness.  LUNGS: Normal breath sounds bilaterally, no wheezing, rales,rhonchi or crepitation. No use of  accessory muscles of respiration.  CARDIOVASCULAR: S1, S2 normal. No murmurs, rubs, or gallops.  ABDOMEN: Soft, nontender, nondistended. Bowel sounds present. EXTREMITIES: No pedal edema, cyanosis, or clubbing.  NEUROLOGIC: Awake and alert. Sensation intact. Gait not checked.  PSYCHIATRIC: The patient is alert and alert SKIN: No obvious rash, lesion, or ulcer.    LABORATORY PANEL:   CBC Recent Labs  Lab 07/26/18 0541  WBC 7.8  HGB 12.2*  HCT 36.9*  PLT 197   ------------------------------------------------------------------------------------------------------------------  Chemistries  Recent Labs  Lab 07/25/18 1559 07/26/18 0541  NA 138 142  K 4.9 4.7  CL 104 109  CO2 21* 19*  GLUCOSE 100* 100*  BUN 71* 76*  CREATININE 8.35* 8.61*  CALCIUM 9.8 9.7  AST 16  --   ALT 9  --   ALKPHOS 53  --   BILITOT 1.0  --    ------------------------------------------------------------------------------------------------------------------  Cardiac Enzymes Recent Labs  Lab 07/25/18 1559  TROPONINI <0.03   ------------------------------------------------------------------------------------------------------------------  RADIOLOGY:  No results found.  EKG:   Orders placed or performed during the hospital encounter of 07/25/18  . ED EKG  . ED EKG  . EKG 12-Lead  . EKG 12-Lead    ASSESSMENT AND PLAN:    #End-stage renal disease He is awaiting spot for hemodialysis as outpatient Tolerating HD well   #Generalized weakness probably secondary to uremia And by PT recommended outpatient PT   #History of multiple myeloma Outpatient follow-up with oncology as recommended  #Essential hypertension- Blood pressure is labile continue to monitor for now  #Dementia-he has some cognitive dysfunction  All the records are reviewed and case discussed with Care Management/Social Workerr. Management plans discussed with the patient, family  and they are in agreement.  CODE  STATUS: Full  TOTAL TIME TAKING CARE OF THIS PATIENT: 32 minutes.   POSSIBLE D/C IN 2-3  DAYS, DEPENDING ON CLINICAL CONDITION.  Note: This dictation was prepared with Dragon dictation along with smaller phrase technology. Any transcriptional errors that result from this process are unintentional.   Dustin Flock M.D on 07/29/2018 at 1:47 PM  Between 7am to 6pm - Pager - [phone] After 6pm go to [email or web address] - password [email or web address] [facility]  Office  [phone]  CC: Primary care physician; System, Pcp Not In

## 2018-07-29 NOTE — Progress Notes (Signed)
[facility]  ROUNDING NOTE   Subjective:  Patient has completed initiation of hemodialysis. We are still awaiting approval from the [facility] for outpatient hemodialysis placement. Otherwise patient appears to be doing well at the moment.   Objective:  Vital signs in last 24 hours:  Temp:  [98.2 F (36.8 C)-98.5 F (36.9 C)] 98.5 F (36.9 C) (01/17 0529) Pulse Rate:  [61-73] 73 (01/17 0529) Resp:  [13-20] 20 (01/17 0529) BP: (100-118)/(68-92) 100/77 (01/17 0529) SpO2:  [98 %-100 %] 99 % (01/17 0529)  Weight change:  Filed Weights   07/27/18 0902 07/27/18 1146 07/27/18 1152  Weight: 96.7 kg 96.3 kg 96.3 kg    Intake/Output: I/O last 3 completed shifts: In: 960 [P.O.:960] Out: 3532 [Urine:600; Other:1004]   Intake/Output this shift:  Total I/O In: 240 [P.O.:240] Out: -   Physical Exam: General: No acute distress  Head: Normocephalic, atraumatic. Moist oral mucosal membranes  Eyes: Anicteric  Neck: Supple, trachea midline  Lungs:  Clear to auscultation, normal effort  Heart: S1S2 no rubs  Abdomen:  Soft, nontender, bowel sounds present  Extremities: Trace peripheral edema.  Neurologic: Awake, alert, following commands  Skin: No lesions  Access: R IJ permcath    Basic Metabolic Panel: Recent Labs  Lab 07/25/18 1559 07/26/18 0541 07/26/18 1904 07/27/18 0959  NA 138 142  --   --   K 4.9 4.7  --   --   CL 104 109  --   --   CO2 21* 19*  --   --   GLUCOSE 100* 100*  --   --   BUN 71* 76*  --   --   CREATININE 8.35* 8.61*  --   --   CALCIUM 9.8 9.7  --   --   PHOS  --   --  4.9* 3.5    Liver Function Tests: Recent Labs  Lab 07/25/18 1559  AST 16  ALT 9  ALKPHOS 53  BILITOT 1.0  PROT 8.5*  ALBUMIN 4.2   No results for input(s): LIPASE, AMYLASE in the last 168 hours. No results for input(s): AMMONIA in the last 168 hours.  CBC: Recent Labs  Lab 07/25/18 1559 07/26/18 0541  WBC 7.0 7.8  HGB 13.5 12.2*  HCT 41.9 36.9*  MCV 85.2 82.0   PLT 276 197    Cardiac Enzymes: Recent Labs  Lab 07/25/18 1559  TROPONINI <0.03    BNP: Invalid input(s): POCBNP  CBG: No results for input(s): GLUCAP in the last 168 hours.  Microbiology: Results for orders placed or performed during the hospital encounter of 07/25/18  MRSA PCR Screening     Status: None   Collection Time: 07/26/18  5:40 AM  Result Value Ref Range Status   MRSA by PCR NEGATIVE NEGATIVE Final    Comment:        The GeneXpert MRSA Assay (FDA approved for NASAL specimens only), is one component of a comprehensive MRSA colonization surveillance program. It is not intended to diagnose MRSA infection nor to guide or monitor treatment for MRSA infections. Performed at [facility], [address] [place], [place] [address]     Coagulation Studies: No results for input(s): LABPROT, INR in the last 72 hours.  Urinalysis: No results for input(s): COLORURINE, LABSPEC, PHURINE, GLUCOSEU, HGBUR, BILIRUBINUR, KETONESUR, PROTEINUR, UROBILINOGEN, NITRITE, LEUKOCYTESUR in the last 72 hours.  Invalid input(s): APPERANCEUR    Imaging: No results found.   Medications:   . sodium chloride    .  sodium chloride     . Chlorhexidine Gluconate Cloth  6 each Topical Q0600  . heparin  5,000 Units Subcutaneous Q8H  . Influenza vac split quadrivalent PF  0.5 mL Intramuscular Tomorrow-1000   sodium chloride, sodium chloride, acetaminophen **OR** acetaminophen, alteplase, heparin, HYDROmorphone (DILAUDID) injection, lidocaine (PF), lidocaine-prilocaine, ondansetron **OR** ondansetron (ZOFRAN) IV, ondansetron (ZOFRAN) IV, pentafluoroprop-tetrafluoroeth  Assessment/ Plan:  68 y.o. male with a PMHx of chronic kidney disease stage V, coronary disease, cognitive disorder, dementia, hypertension, history of multiple myeloma, who was admitted to [facility] on 07/25/2018 for evaluation of increasing weakness.  1.  End stage renal disease:   At this point and we don't  have access to his labs at the [place] was in contact with the [place].  On 06/02/18 his creatinine was 6.2 with a BUN of 57.   -She has completed initiation of dialysis.  No urgent indication for dialysis today.  We will plan for dialysis again tomorrow if still here.  Otherwise we are waiting approval from the [place] in terms of outpatient dialysis placement.  2.  Anemia chronic kidney disease.  No indication for Epogen currently.  3.  Secondary hyperparathyroidism.  Recheck serum phosphorus with next dialysis treatment.  Phosphorus currently under good control.   LOS: 3 Odis Wickey 1/17/202011:07 AM

## 2018-07-29 NOTE — Care Management Important Message (Signed)
Copy of signed Medicare IM left with patient in room. 

## 2018-07-29 NOTE — Care Management (Signed)
Results for CYLIS, AYARS (MRN [id number]) as of 07/29/2018 15:10  Ref. Range 07/26/2018 19:04  Hep B S Ab Unknown Non Reactive  Hep B Core Ab, Tot Latest Ref Range: Negative  Positive (A)  Hepatitis B-Post Latest Ref Range: Immunity>9.9 mIU/mL <3.1 (L)

## 2018-07-29 NOTE — Care Management (Signed)
Hepatitis results faxed to [phone].  Per Anthony Houston patient To go to [facility] TTS [id number]

## 2018-07-29 NOTE — Progress Notes (Signed)
Patient up to chair yesterday a.m.  Transported to dialysis via chair and up to chair for entire duration of dialysis.  Patient up to chair in room for several hours today.  Tolerated well.

## 2018-07-29 NOTE — Clinical Social Work Note (Signed)
Still waiting on dialysis chair time for patient. Patient will discharge back to [facility] when time. Shela Leff MSW,LCSW [phone]

## 2018-07-30 LAB — PHOSPHORUS: Phosphorus: 2.6 mg/dL (ref 2.5–4.6)

## 2018-07-30 LAB — HEPATITIS B SURFACE ANTIGEN: Hepatitis B Surface Ag: NEGATIVE

## 2018-07-30 NOTE — Progress Notes (Signed)
[place] at [place] NAME: Anthony Houston    MR#:  [id number]  DATE OF BIRTH:  02-12-1951  SUBJECTIVE:  CHIEF COMPLAINT:  No new complaints he is awaiting outpatient hemodialysis slot REVIEW OF SYSTEMS:    CONSTITUTIONAL: No documented fever.  Positive fatigue, positive weakness. No weight gain, no weight loss.  EYES: No blurry or double vision.  ENT: No tinnitus. No postnasal drip. No redness of the oropharynx.  RESPIRATORY: No cough, no wheeze, no hemoptysis. No dyspnea.  CARDIOVASCULAR: No chest pain. No orthopnea. No palpitations. No syncope.  GASTROINTESTINAL: No nausea, no vomiting or diarrhea. No abdominal pain. No melena or hematochezia.  GENITOURINARY:  No urgency. No frequency. No dysuria. No hematuria. No obstructive symptoms. No discharge. No pain. No significant abnormal bleeding ENDOCRINE: No polyuria or nocturia. No heat or cold intolerance.  HEMATOLOGY: No anemia. No bruising. No bleeding. No purpura. No petechiae INTEGUMENTARY: No rashes. No lesions.  MUSCULOSKELETAL: No arthritis. No swelling. No gout.  NEUROLOGIC: No numbness, tingling, or ataxia. No seizure-type activity.  PSYCHIATRIC: No anxiety. No insomnia. No ADD.     DRUG ALLERGIES:   Allergies  Allergen Reactions  . Lisinopril     VITALS:  Blood pressure 95/73, pulse 72, temperature 99 F (37.2 C), temperature source Oral, resp. rate 20, height 6' (1.829 m), weight 96.3 kg, SpO2 97 %.  PHYSICAL EXAMINATION:  GENERAL:  68 y.o.-year-old patient lying in the bed with no acute distress.  EYES: Pupils equal, round, reactive to light and accommodation. No scleral icterus. Extraocular muscles intact.  HEENT: Head atraumatic, normocephalic. Oropharynx and nasopharynx clear.  NECK:  Supple, no jugular venous distention. No thyroid enlargement, no tenderness.  LUNGS: Normal breath sounds bilaterally, no wheezing, rales,rhonchi or crepitation. No use of  accessory muscles of respiration.  CARDIOVASCULAR: S1, S2 normal. No murmurs, rubs, or gallops.  ABDOMEN: Soft, nontender, nondistended. Bowel sounds present. EXTREMITIES: No pedal edema, cyanosis, or clubbing.  NEUROLOGIC: Awake and alert. Sensation intact. Gait not checked.  PSYCHIATRIC: The patient is alert and alert SKIN: No obvious rash, lesion, or ulcer.    LABORATORY PANEL:   CBC Recent Labs  Lab 07/26/18 0541  WBC 7.8  HGB 12.2*  HCT 36.9*  PLT 197   ------------------------------------------------------------------------------------------------------------------  Chemistries  Recent Labs  Lab 07/25/18 1559 07/26/18 0541  NA 138 142  K 4.9 4.7  CL 104 109  CO2 21* 19*  GLUCOSE 100* 100*  BUN 71* 76*  CREATININE 8.35* 8.61*  CALCIUM 9.8 9.7  AST 16  --   ALT 9  --   ALKPHOS 53  --   BILITOT 1.0  --    ------------------------------------------------------------------------------------------------------------------  Cardiac Enzymes Recent Labs  Lab 07/25/18 1559  TROPONINI <0.03   ------------------------------------------------------------------------------------------------------------------  RADIOLOGY:  No results found.  EKG:   Orders placed or performed during the hospital encounter of 07/25/18  . ED EKG  . ED EKG  . EKG 12-Lead  . EKG 12-Lead    ASSESSMENT AND PLAN:    #End-stage renal disease He is awaiting spot for hemodialysis as outpatient Tolerating HD well   #Generalized weakness probably secondary to uremia And by PT recommended outpatient PT   #History of multiple myeloma Outpatient follow-up with oncology as recommended  #Essential hypertension- Blood pressure is labile continue to monitor for now  #Dementia-he has some cognitive dysfunction  All the records are reviewed and case discussed with Care Management/Social Workerr. Management plans discussed with the patient,  family and they are in agreement.  CODE  STATUS: Full  TOTAL TIME TAKING CARE OF THIS PATIENT: 25 minutes.   POSSIBLE D/C IN 2-3  DAYS, DEPENDING ON CLINICAL CONDITION.  Note: This dictation was prepared with Dragon dictation along with smaller phrase technology. Any transcriptional errors that result from this process are unintentional.   Dustin Flock M.D on 07/30/2018 at 12:55 PM  Between 7am to 6pm - Pager - [phone] After 6pm go to [email or web address] - password [email or web address] [facility]  Office  [phone]  CC: Primary care physician; System, Pcp Not In

## 2018-07-30 NOTE — Progress Notes (Signed)
Post dialysis assessment 

## 2018-07-30 NOTE — Progress Notes (Signed)
This note also relates to the following rows which could not be included: BP - Cannot attach notes to unvalidated device data  Hd started

## 2018-07-30 NOTE — Progress Notes (Signed)
[facility]  ROUNDING NOTE   Subjective:  Patient resting comfortably in bed. Due for fourth dialysis treatment today. Overall in good spirits.   Objective:  Vital signs in last 24 hours:  Temp:  [98.3 F (36.8 C)-99 F (37.2 C)] 99 F (37.2 C) (01/18 0513) Pulse Rate:  [71-82] 72 (01/18 0513) Resp:  [20] 20 (01/18 0513) BP: (95-109)/(69-83) 95/73 (01/18 0513) SpO2:  [97 %-99 %] 97 % (01/18 0513)  Weight change:  Filed Weights   07/27/18 0902 07/27/18 1146 07/27/18 1152  Weight: 96.7 kg 96.3 kg 96.3 kg    Intake/Output: I/O last 3 completed shifts: In: 1440 [P.O.:1440] Out: 175 [Urine:175]   Intake/Output this shift:  Total I/O In: 480 [P.O.:480] Out: -   Physical Exam: General: No acute distress  Head: Normocephalic, atraumatic. Moist oral mucosal membranes  Eyes: Anicteric  Neck: Supple, trachea midline  Lungs:  Clear to auscultation, normal effort  Heart: S1S2 no rubs  Abdomen:  Soft, nontender, bowel sounds present  Extremities: Trace peripheral edema.  Neurologic: Awake, alert, following commands  Skin: No lesions  Access: R IJ permcath    Basic Metabolic Panel: Recent Labs  Lab 07/25/18 1559 07/26/18 0541 07/26/18 1904 07/27/18 0959  NA 138 142  --   --   K 4.9 4.7  --   --   CL 104 109  --   --   CO2 21* 19*  --   --   GLUCOSE 100* 100*  --   --   BUN 71* 76*  --   --   CREATININE 8.35* 8.61*  --   --   CALCIUM 9.8 9.7  --   --   PHOS  --   --  4.9* 3.5    Liver Function Tests: Recent Labs  Lab 07/25/18 1559  AST 16  ALT 9  ALKPHOS 53  BILITOT 1.0  PROT 8.5*  ALBUMIN 4.2   No results for input(s): LIPASE, AMYLASE in the last 168 hours. No results for input(s): AMMONIA in the last 168 hours.  CBC: Recent Labs  Lab 07/25/18 1559 07/26/18 0541  WBC 7.0 7.8  HGB 13.5 12.2*  HCT 41.9 36.9*  MCV 85.2 82.0  PLT 276 197    Cardiac Enzymes: Recent Labs  Lab 07/25/18 1559  TROPONINI <0.03    BNP: Invalid  input(s): POCBNP  CBG: No results for input(s): GLUCAP in the last 168 hours.  Microbiology: Results for orders placed or performed during the hospital encounter of 07/25/18  MRSA PCR Screening     Status: None   Collection Time: 07/26/18  5:40 AM  Result Value Ref Range Status   MRSA by PCR NEGATIVE NEGATIVE Final    Comment:        The GeneXpert MRSA Assay (FDA approved for NASAL specimens only), is one component of a comprehensive MRSA colonization surveillance program. It is not intended to diagnose MRSA infection nor to guide or monitor treatment for MRSA infections. Performed at [facility], [address] [place], [place] [address]     Coagulation Studies: No results for input(s): LABPROT, INR in the last 72 hours.  Urinalysis: No results for input(s): COLORURINE, LABSPEC, PHURINE, GLUCOSEU, HGBUR, BILIRUBINUR, KETONESUR, PROTEINUR, UROBILINOGEN, NITRITE, LEUKOCYTESUR in the last 72 hours.  Invalid input(s): APPERANCEUR    Imaging: No results found.   Medications:   . sodium chloride    . sodium chloride     . Chlorhexidine Gluconate Cloth  6 each Topical  Q0600  . heparin  5,000 Units Subcutaneous Q8H  . Influenza vac split quadrivalent PF  0.5 mL Intramuscular Tomorrow-1000   sodium chloride, sodium chloride, acetaminophen **OR** acetaminophen, alteplase, heparin, HYDROmorphone (DILAUDID) injection, lidocaine (PF), lidocaine-prilocaine, ondansetron **OR** ondansetron (ZOFRAN) IV, pentafluoroprop-tetrafluoroeth  Assessment/ Plan:  68 y.o. male with a PMHx of chronic kidney disease stage V, coronary disease, cognitive disorder, dementia, hypertension, history of multiple myeloma, who was admitted to [facility] on 07/25/2018 for evaluation of increasing weakness.  1.  End stage renal disease:   At this point and we don't have access to his labs at the [place] was in contact with the [place].  On 06/02/18 his creatinine was 6.2 with a BUN of 57.    -Patient has done well with initiation of hemodialysis.  We will plan for dialysis treatment today.  Hepatitis B surface antigen was found to be negative.  2.  Anemia chronic kidney disease.  Most recent hemoglobin was 12.2.  No indication for Epogen at this time.  3.  Secondary hyperparathyroidism.  Phosphorus was 3.5 at last check.  Repeat this today.  No indication for binders at the moment.   LOS: 4 Rori Goar 1/18/202011:40 AM

## 2018-07-30 NOTE — Progress Notes (Signed)
This note also relates to the following rows which could not be included: Pulse Rate - Cannot attach notes to unvalidated device data Resp - Cannot attach notes to unvalidated device data  Hd completed  

## 2018-07-31 NOTE — Care Management Note (Signed)
Case Management Note  Patient Details  Name: Anthony Houston MRN: [id number] Date of Birth: 1950/11/17  Subjective/Objective:   RNCM consulted on patient for new recommendation of home health. Patient currently resides at [facility] assisted living but does believe he could benefit from home health services. CMS Medicare.gov Compare Post Acute Care list reviewed with patient and he has no preference. Referral placed with Jermaine from [facility]. Patient has rolling walker and cane, no further DME needs. Patient has chair time for [facility] in [place] on TTS to become new HD patient.                   Action/Plan: MD notified of disposition. RNCM to continue to follow for any needs.   Expected Discharge Date:  07/29/18               Expected Discharge Plan:  [place]  In-House Referral:     Discharge planning Services  CM Consult  Post Acute Care Choice:  Home Health Choice offered to:  Patient  DME Arranged:    DME Agency:     HH Arranged:    [place] Agency:  [place]  Status of Service:  In process, will continue to follow  If discussed at Long Length of Stay Meetings, dates discussed:    Additional Comments:  Latanya Maudlin, RN 07/31/2018, 1:45 PM

## 2018-07-31 NOTE — Progress Notes (Signed)
[place] at [place] NAME: Anthony Houston    MR#:  [id number]  DATE OF BIRTH:  11/25/1950  SUBJECTIVE:  CHIEF COMPLAINT:  Patient denies any symptoms   REVIEW OF SYSTEMS:    CONSTITUTIONAL: No documented fever.  Positive fatigue, positive weakness. No weight gain, no weight loss.  EYES: No blurry or double vision.  ENT: No tinnitus. No postnasal drip. No redness of the oropharynx.  RESPIRATORY: No cough, no wheeze, no hemoptysis. No dyspnea.  CARDIOVASCULAR: No chest pain. No orthopnea. No palpitations. No syncope.  GASTROINTESTINAL: No nausea, no vomiting or diarrhea. No abdominal pain. No melena or hematochezia.  GENITOURINARY:  No urgency. No frequency. No dysuria. No hematuria. No obstructive symptoms. No discharge. No pain. No significant abnormal bleeding ENDOCRINE: No polyuria or nocturia. No heat or cold intolerance.  HEMATOLOGY: No anemia. No bruising. No bleeding. No purpura. No petechiae INTEGUMENTARY: No rashes. No lesions.  MUSCULOSKELETAL: No arthritis. No swelling. No gout.  NEUROLOGIC: No numbness, tingling, or ataxia. No seizure-type activity.  PSYCHIATRIC: No anxiety. No insomnia. No ADD.     DRUG ALLERGIES:   Allergies  Allergen Reactions  . Lisinopril     VITALS:  Blood pressure 101/76, pulse 72, temperature 98.3 F (36.8 C), temperature source Oral, resp. rate 18, height 6' (1.829 m), weight 96.7 kg, SpO2 98 %.  PHYSICAL EXAMINATION:  GENERAL:  68 y.o.-year-old patient lying in the bed with no acute distress.  EYES: Pupils equal, round, reactive to light and accommodation. No scleral icterus. Extraocular muscles intact.  HEENT: Head atraumatic, normocephalic. Oropharynx and nasopharynx clear.  NECK:  Supple, no jugular venous distention. No thyroid enlargement, no tenderness.  LUNGS: Normal breath sounds bilaterally, no wheezing, rales,rhonchi or crepitation. No use of accessory muscles of  respiration.  CARDIOVASCULAR: S1, S2 normal. No murmurs, rubs, or gallops.  ABDOMEN: Soft, nontender, nondistended. Bowel sounds present. EXTREMITIES: No pedal edema, cyanosis, or clubbing.  NEUROLOGIC: Awake and alert. Sensation intact. Gait not checked.  PSYCHIATRIC: The patient is alert and alert SKIN: No obvious rash, lesion, or ulcer.    LABORATORY PANEL:   CBC Recent Labs  Lab 07/26/18 0541  WBC 7.8  HGB 12.2*  HCT 36.9*  PLT 197   ------------------------------------------------------------------------------------------------------------------  Chemistries  Recent Labs  Lab 07/25/18 1559 07/26/18 0541  NA 138 142  K 4.9 4.7  CL 104 109  CO2 21* 19*  GLUCOSE 100* 100*  BUN 71* 76*  CREATININE 8.35* 8.61*  CALCIUM 9.8 9.7  AST 16  --   ALT 9  --   ALKPHOS 53  --   BILITOT 1.0  --    ------------------------------------------------------------------------------------------------------------------  Cardiac Enzymes Recent Labs  Lab 07/25/18 1559  TROPONINI <0.03   ------------------------------------------------------------------------------------------------------------------  RADIOLOGY:  No results found.  EKG:   Orders placed or performed during the hospital encounter of 07/25/18  . ED EKG  . ED EKG  . EKG 12-Lead  . EKG 12-Lead    ASSESSMENT AND PLAN:    #End-stage renal disease He is awaiting spot for hemodialysis as outpatient Tolerating HD well   #Generalized weakness probably secondary to uremia And by PT recommended outpatient PT   #History of multiple myeloma Outpatient follow-up with oncology as recommended  #Essential hypertension- Blood pressure is labile continue to monitor for now  #Dementia-he has some cognitive dysfunction  All the records are reviewed and case discussed with Care Management/Social Workerr. Management plans discussed with the patient, family and they  are in agreement.  CODE STATUS: Full  TOTAL  TIME TAKING CARE OF THIS PATIENT: 25 minutes.   POSSIBLE D/C IN 2-3  DAYS, DEPENDING ON CLINICAL CONDITION.  Note: This dictation was prepared with Dragon dictation along with smaller phrase technology. Any transcriptional errors that result from this process are unintentional.     M.D on 07/31/2018 at 11:57 AM  Between 7am to 6pm - Pager - [phone] After 6pm go to [email or web address] - password [email or web address]  [facility]  Office  [phone]  CC: Primary care physician; System, Pcp Not In 

## 2018-07-31 NOTE — Progress Notes (Signed)
[facility]  ROUNDING NOTE   Subjective:  Patient doing well currently. He is done well with initiation of dialysis. Resting in bed.  Objective:  Vital signs in last 24 hours:  Temp:  [97.7 F (36.5 C)-98.6 F (37 C)] 98.3 F (36.8 C) (01/19 0609) Pulse Rate:  [70-75] 72 (01/19 0609) Resp:  [14-20] 18 (01/19 0609) BP: (93-122)/(63-86) 101/76 (01/19 0609) SpO2:  [98 %-100 %] 98 % (01/19 0609) Weight:  [96.7 kg] 96.7 kg (01/18 1435)  Weight change:  Filed Weights   07/27/18 1146 07/27/18 1152 07/30/18 1435  Weight: 96.3 kg 96.3 kg 96.7 kg    Intake/Output: I/O last 3 completed shifts: In: 960 [P.O.:960] Out: 1450 [Urine:450; Other:1000]   Intake/Output this shift:  No intake/output data recorded.  Physical Exam: General: No acute distress  Head: Normocephalic, atraumatic. Moist oral mucosal membranes  Eyes: Anicteric  Neck: Supple, trachea midline  Lungs:  Clear to auscultation, normal effort  Heart: S1S2 no rubs  Abdomen:  Soft, nontender, bowel sounds present  Extremities: Trace peripheral edema.  Neurologic: Awake, alert, following commands  Skin: No lesions  Access: R IJ permcath    Basic Metabolic Panel: Recent Labs  Lab 07/25/18 1559 07/26/18 0541 07/26/18 1904 07/27/18 0959 07/30/18 1450  NA 138 142  --   --   --   K 4.9 4.7  --   --   --   CL 104 109  --   --   --   CO2 21* 19*  --   --   --   GLUCOSE 100* 100*  --   --   --   BUN 71* 76*  --   --   --   CREATININE 8.35* 8.61*  --   --   --   CALCIUM 9.8 9.7  --   --   --   PHOS  --   --  4.9* 3.5 2.6    Liver Function Tests: Recent Labs  Lab 07/25/18 1559  AST 16  ALT 9  ALKPHOS 53  BILITOT 1.0  PROT 8.5*  ALBUMIN 4.2   No results for input(s): LIPASE, AMYLASE in the last 168 hours. No results for input(s): AMMONIA in the last 168 hours.  CBC: Recent Labs  Lab 07/25/18 1559 07/26/18 0541  WBC 7.0 7.8  HGB 13.5 12.2*  HCT 41.9 36.9*  MCV 85.2 82.0  PLT 276  197    Cardiac Enzymes: Recent Labs  Lab 07/25/18 1559  TROPONINI <0.03    BNP: Invalid input(s): POCBNP  CBG: No results for input(s): GLUCAP in the last 168 hours.  Microbiology: Results for orders placed or performed during the hospital encounter of 07/25/18  MRSA PCR Screening     Status: None   Collection Time: 07/26/18  5:40 AM  Result Value Ref Range Status   MRSA by PCR NEGATIVE NEGATIVE Final    Comment:        The GeneXpert MRSA Assay (FDA approved for NASAL specimens only), is one component of a comprehensive MRSA colonization surveillance program. It is not intended to diagnose MRSA infection nor to guide or monitor treatment for MRSA infections. Performed at [facility], [address] [place], [place] [address]     Coagulation Studies: No results for input(s): LABPROT, INR in the last 72 hours.  Urinalysis: No results for input(s): COLORURINE, LABSPEC, PHURINE, GLUCOSEU, HGBUR, BILIRUBINUR, KETONESUR, PROTEINUR, UROBILINOGEN, NITRITE, LEUKOCYTESUR in the last 72 hours.  Invalid input(s): APPERANCEUR  Imaging: No results found.   Medications:   . sodium chloride    . sodium chloride     . Chlorhexidine Gluconate Cloth  6 each Topical Q0600  . heparin  5,000 Units Subcutaneous Q8H  . Influenza vac split quadrivalent PF  0.5 mL Intramuscular Tomorrow-1000   sodium chloride, sodium chloride, acetaminophen **OR** acetaminophen, alteplase, heparin, HYDROmorphone (DILAUDID) injection, lidocaine (PF), lidocaine-prilocaine, ondansetron **OR** ondansetron (ZOFRAN) IV, pentafluoroprop-tetrafluoroeth  Assessment/ Plan:  68 y.o. male with a PMHx of chronic kidney disease stage V, coronary disease, cognitive disorder, dementia, hypertension, history of multiple myeloma, who was admitted to [facility] on 07/25/2018 for evaluation of increasing weakness.  1.  End stage renal disease:   At this point and we don't have access to his labs at the [place] was in contact with the [place].  On 06/02/18 his creatinine was 6.2 with a BUN of 57.   -Patient completed hemodialysis yesterday.  No acute indication for dialysis today.  Next scheduled dialysis treatment will be on Tuesday.  Patient to be placed at [facility]. [address], [place], [place] from [place].  Hepatitis B surface antigen back and negative.    2.  Anemia chronic kidney disease.  No indication for Epogen at this time given hemoglobin of 12.2 recently.  3.  Secondary hyperparathyroidism.  Phosphorus down to 2.6.  No indication for binders.   LOS: 5 Emani Morad 1/19/202012:28 PM

## 2018-08-01 MED ORDER — RENA-VITE PO TABS: 1.0000 | ORAL_TABLET | Freq: Every day | ORAL | Status: DC

## 2018-08-01 MED ORDER — NEPRO/CARBSTEADY PO LIQD
237.0000 mL | Freq: Two times a day (BID) | ORAL | Status: DC
  Administered 2018-08-01: 237 mL via ORAL

## 2018-08-01 NOTE — Progress Notes (Signed)
Physical Therapy Treatment Patient Details Name: Anthony Houston MRN: [id number] DOB: July 04, 1951 Today's Date: 08/01/2018    History of Present Illness 68 y.o. male with a PMHx of chronic kidney disease stage V, coronary disease, cognitive disorder, dementia, hypertension, history of multiple myeloma, who was admitted to [facility] on 07/25/2018 for evaluation of increasing weakness.  tunneled dialysis catheter right IJ approach 07/26/2018.    PT Comments    Pt presents with deficits in strength, transfers, mobility, gait, balance, and activity tolerance.  Pt required extra time and effort with bed mobility tasks but no physical assistance.  Pt required CGA/min A depending on chair height during transfer training along with cues for hand placement and increased trunk flexion.  Pt was able to amb 6' with a RW and CGA with min verbal cues for general sequencing for safe use of a RW.  Pt will benefit from [place] services in a ALF setting upon discharge to safely address above deficits for decreased caregiver assistance and eventual return to PLOF.     Follow Up Recommendations  Home health PT;Supervision/Assistance - 24 hour     Equipment Recommendations  Rolling walker with 5" wheels;Other (comment)(if pt does not currently own one)    Recommendations for Other Services       Precautions / Restrictions Precautions Precautions: Fall Precaution Comments: s/p permcath placement Restrictions Weight Bearing Restrictions: No    Mobility  Bed Mobility Overal bed mobility: Modified Independent             General bed mobility comments: use of bed rails, verbal cues, extra time and effort but no physical assistance required  Transfers Overall transfer level: Needs assistance Equipment used: Rolling walker (2 wheeled) Transfers: Sit to/from Stand Sit to Stand: Min assist;From elevated surface         General transfer comment: Mod verbal cues for hand  placement  Ambulation/Gait Ambulation/Gait assistance: Min assist Gait Distance (Feet): 6 Feet Assistive device: Rolling walker (2 wheeled) Gait Pattern/deviations: Step-through pattern;Decreased step length - right;Decreased step length - left;Trunk flexed;Shuffle Gait velocity: Decreased   General Gait Details: decreased gait velocity, flexed trunk, bilaterl shuffling steps noted   Stairs             Wheelchair Mobility    Modified Rankin (Stroke Patients Only)       Balance Overall balance assessment: Needs assistance   Sitting balance-Leahy Scale: Fair     Standing balance support: Bilateral upper extremity supported;During functional activity Standing balance-Leahy Scale: Poor                              Cognition Arousal/Alertness: Awake/alert Behavior During Therapy: WFL for tasks assessed/performed Overall Cognitive Status: No family/caregiver present to determine baseline cognitive functioning                                        Exercises Total Joint Exercises Ankle Circles/Pumps: AROM;Both;5 reps;10 reps Quad Sets: Strengthening;Both;5 reps;10 reps Gluteal Sets: Strengthening;Both;10 reps Heel Slides: AROM;Both;5 reps Hip ABduction/ADduction: AROM;AAROM;Both;10 reps Straight Leg Raises: AROM;AAROM;Both;10 reps Long Arc Quad: AROM;Both;Strengthening;10 reps;15 reps Knee Flexion: AROM;Strengthening;Both;10 reps;15 reps Marching in Standing: AROM;Both;10 reps;Seated    General Comments        Pertinent Vitals/Pain Pain Assessment: No/denies pain    Home Living  Prior Function            PT Goals (current goals can now be found in the care plan section) Progress towards PT goals: Progressing toward goals    Frequency    Min 2X/week      PT Plan Current plan remains appropriate    Co-evaluation              AM-PAC PT "6 Clicks" Mobility   Outcome Measure  Help  needed turning from your back to your side while in a flat bed without using bedrails?: None Help needed moving from lying on your back to sitting on the side of a flat bed without using bedrails?: None Help needed moving to and from a bed to a chair (including a wheelchair)?: A Little Help needed standing up from a chair using your arms (e.g., wheelchair or bedside chair)?: A Little Help needed to walk in hospital room?: A Little Help needed climbing 3-5 steps with a railing? : Total 6 Click Score: 18    End of Session Equipment Utilized During Treatment: Gait belt Activity Tolerance: Patient tolerated treatment well Patient left: with chair alarm set;in chair;with call bell/phone within reach Nurse Communication: Mobility status PT Visit Diagnosis: Other abnormalities of gait and mobility (R26.89);Difficulty in walking, not elsewhere classified (R26.2);Muscle weakness (generalized) (M62.81)     Time: 9987-2158 PT Time Calculation (min) (ACUTE ONLY): 23 min  Charges:  $Therapeutic Exercise: 8-22 mins $Therapeutic Activity: 8-22 mins   D. Scott Maksymilian Mabey PT, DPT 08/01/18, 1:14 PM

## 2018-08-01 NOTE — Progress Notes (Signed)
[facility]  ROUNDING NOTE   Subjective:   No complaints.   Plan on starting outpatient dialysis tomorrow.   Objective:  Vital signs in last 24 hours:  Temp:  [97.9 F (36.6 C)-98.3 F (36.8 C)] 97.9 F (36.6 C) (01/20 0358) Pulse Rate:  [67-77] 67 (01/20 0358) Resp:  [18-20] 20 (01/20 0358) BP: (91-123)/(67-83) 123/83 (01/20 0358) SpO2:  [98 %-99 %] 99 % (01/20 0358)  Weight change:  Filed Weights   07/27/18 1146 07/27/18 1152 07/30/18 1435  Weight: 96.3 kg 96.3 kg 96.7 kg    Intake/Output: I/O last 3 completed shifts: In: 480 [P.O.:480] Out: 300 [Urine:300]   Intake/Output this shift:  Total I/O In: -  Out: 300 [Urine:300]  Physical Exam: General: No acute distress  Head: Normocephalic, atraumatic. Moist oral mucosal membranes  Eyes: Anicteric  Neck: Supple, trachea midline  Lungs:  Clear to auscultation, normal effort  Heart: S1S2 no rubs  Abdomen:  Soft, nontender, bowel sounds present  Extremities: No peripheral edema.  Neurologic: Awake, alert, following commands  Skin: No lesions  Access: R IJ permcath 07/26/18 Dr. Delana Meyer    Basic Metabolic Panel: Recent Labs  Lab 07/25/18 1559 07/26/18 0541 07/26/18 1904 07/27/18 0959 07/30/18 1450  NA 138 142  --   --   --   K 4.9 4.7  --   --   --   CL 104 109  --   --   --   CO2 21* 19*  --   --   --   GLUCOSE 100* 100*  --   --   --   BUN 71* 76*  --   --   --   CREATININE 8.35* 8.61*  --   --   --   CALCIUM 9.8 9.7  --   --   --   PHOS  --   --  4.9* 3.5 2.6    Liver Function Tests: Recent Labs  Lab 07/25/18 1559  AST 16  ALT 9  ALKPHOS 53  BILITOT 1.0  PROT 8.5*  ALBUMIN 4.2   No results for input(s): LIPASE, AMYLASE in the last 168 hours. No results for input(s): AMMONIA in the last 168 hours.  CBC: Recent Labs  Lab 07/25/18 1559 07/26/18 0541  WBC 7.0 7.8  HGB 13.5 12.2*  HCT 41.9 36.9*  MCV 85.2 82.0  PLT 276 197    Cardiac Enzymes: Recent Labs  Lab  07/25/18 1559  TROPONINI <0.03    BNP: Invalid input(s): POCBNP  CBG: No results for input(s): GLUCAP in the last 168 hours.  Microbiology: Results for orders placed or performed during the hospital encounter of 07/25/18  MRSA PCR Screening     Status: None   Collection Time: 07/26/18  5:40 AM  Result Value Ref Range Status   MRSA by PCR NEGATIVE NEGATIVE Final    Comment:        The GeneXpert MRSA Assay (FDA approved for NASAL specimens only), is one component of a comprehensive MRSA colonization surveillance program. It is not intended to diagnose MRSA infection nor to guide or monitor treatment for MRSA infections. Performed at [facility], [address] [place], [place] [address]     Coagulation Studies: No results for input(s): LABPROT, INR in the last 72 hours.  Urinalysis: No results for input(s): COLORURINE, LABSPEC, PHURINE, GLUCOSEU, HGBUR, BILIRUBINUR, KETONESUR, PROTEINUR, UROBILINOGEN, NITRITE, LEUKOCYTESUR in the last 72 hours.  Invalid input(s): APPERANCEUR    Imaging: No results found.  Medications:   . sodium chloride    . sodium chloride     . Chlorhexidine Gluconate Cloth  6 each Topical Q0600  . feeding supplement (NEPRO CARB STEADY)  237 mL Oral BID BM  . heparin  5,000 Units Subcutaneous Q8H  . Influenza vac split quadrivalent PF  0.5 mL Intramuscular Tomorrow-1000  . multivitamin  1 tablet Oral QHS   sodium chloride, sodium chloride, acetaminophen **OR** acetaminophen, alteplase, heparin, HYDROmorphone (DILAUDID) injection, lidocaine (PF), lidocaine-prilocaine, ondansetron **OR** ondansetron (ZOFRAN) IV, pentafluoroprop-tetrafluoroeth  Assessment/ Plan:  68 y.o. male  Anthony Houston is a 68 y.o. black male with coronary artery disease, cognitive disorder, dementia, hypertension, history of multiple myeloma, who was admitted to [facility] on 07/25/2018 for evaluation of increasing weakness. Started on hemodialysis on  1/14. Completed four treatments.   1.  End stage renal disease:   TTS schedule. RIJ catheter Outpatient planning for [facility] 2nd shift.   2.  Anemia chronic kidney disease.   No indication for epo.   3.  Secondary hyperparathyroidism. No indication for binders. PTH low at 100. No indication for vitamin D agents.   4. Hypertension: blood pressure at goal. Off all agents.    LOS: 6 Anthony Houston 1/20/202012:37 PM

## 2018-08-01 NOTE — Care Management Important Message (Signed)
Copy of signed Medicare IM left with patient in room. 

## 2018-08-01 NOTE — Care Management Note (Signed)
Case Management Note  Patient Details  Name: SNYDER COLAVITO MRN: [id number] Date of Birth: 1950/12/11    Patient to discharge back to [facility] today.  Jermaine with [facility] notified of discharge.  [facility] HD liaison has confirmed patient has authorization and can start out patient HD tomorrow.   Subjective/Objective:                    Action/Plan:   Expected Discharge Date:  08/01/18               Expected Discharge Plan:  [place]  In-House Referral:     Discharge planning Services  CM Consult  Post Acute Care Choice:  Home Health Choice offered to:  Patient  DME Arranged:    DME Agency:     HH Arranged:  PT, Nurse's Aide [place] Agency:  [place]  Status of Service:  Completed, signed off  If discussed at [place] of Stay Meetings, dates discussed:    Additional Comments:  Beverly Sessions, RN 08/01/2018, 1:46 PM

## 2018-08-01 NOTE — Discharge Summary (Signed)
[place] at [facility], 69 y.o., DOB 02-04-1951, MRN [id number]. Admission date: 07/25/2018 Discharge Date 08/01/2018 Primary MD System, Pcp Not In Admitting Physician Lance Coon, MD  Admission Diagnosis  Generalized weakness [R53.1] Acute renal failure superimposed on chronic kidney disease, unspecified CKD stage, unspecified acute renal failure type ([place]) [N17.9, N18.9]  Discharge Diagnosis   Principal Problem:   ESRD needing dialysis ([place])   HTN (hypertension)   CAD (coronary artery disease)   Weakness           Hospital Course Patient is a 68 y.o. male with a PMHx of chronic kidney disease stage V, coronary disease, cognitive disorder, dementia, hypertension, history of multiple myeloma, who was admitted to [facility] on 07/25/2018 for evaluation of increasing weakness.  He resides at a local nursing home.  He receives most of his primary care at the [facility].  Patient had recent labs performed at the [place] on 06/02/18 which indicated a creatinine of 6.2 with a BUN of 57.  Renal function has worsened and creatinine was 8.35 with a BUN of 71 and upon admission here.  Patient came into the hospital to initiate dialysis.  Transferred to the [place] was attempted however there were no dialysis .  Patient was admitted and initiated on dialysis.  He also received dialysis catheter placement by vascular surgery.  He tolerated his dialysis well and has been doing well.  Patient waited to receive hemodialysis spot which she has been assigned and is stable to return to his assisted living.           Consults nephrology and vascular surgery  Significant Tests:  See full reports for all details    Dg Chest 2 View  Result Date: 07/25/2018 CLINICAL DATA:  Weakness. EXAM: CHEST - 2 VIEW COMPARISON:  03/30/2012. FINDINGS: Mediastinum hilar structures normal. Low lung volumes. No focal alveolar infiltrate. No pleural effusion or pneumothorax. Heart size  normal. Diffuse osteopenia degenerative change thoracic spine. IMPRESSION: Low lung volumes.  No acute abnormality. Electronically Signed   By: Marcello Moores  Register   On: 07/25/2018 15:52   Ct Head Wo Contrast  Result Date: 07/25/2018 CLINICAL DATA:  Generalized muscle weakness EXAM: CT HEAD WITHOUT CONTRAST TECHNIQUE: Contiguous axial images were obtained from the base of the skull through the vertex without intravenous contrast. Sagittal and coronal MPR images reconstructed from axial data set. COMPARISON:  None FINDINGS: Brain: Generalized atrophy with ex vacuo dilatation of the ventricular system. No midline shift or mass effect. Small vessel chronic ischemic changes of deep cerebral white matter. 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 No intracranial hemorrhage, mass lesion, or evidence of acute infarction. No extra-axial fluid collection. Vascular: Minimal atherosclerotic calcifications of internal carotid arteries at skull base Skull: Demineralized but intact Sinuses/Orbits: Clear Other: N/A IMPRESSION: Atrophy with small vessel chronic ischemic changes of deep cerebral white matter. Old LEFT basal ganglia lacunar infarct. No acute intracranial abnormalities. Electronically Signed   By: Lavonia Dana M.D.   On: 07/25/2018 18:30   US Renal  Result Date: 07/26/2018 CLINICAL DATA:  Stage 5 chronic kidney disease. EXAM: RENAL / URINARY TRACT ULTRASOUND COMPLETE COMPARISON:  Urinary tract ultrasound 03/31/2012. FINDINGS: Right Kidney: Renal measurements: Approximately 9.8 x 5.5 x 5.2 cm = volume: 146 mL. No hydronephrosis. Echogenic parenchyma with mild to moderate diffuse cortical thinning, a new finding since the 2013 examination. Multiple benign cysts as noted previously, the largest arising from the mid kidney measuring approximately 3.6 cm.  No solid renal masses. No convincing evidence of nephrolithiasis. Left Kidney: Renal measurements: Approximately 11.4 x 5.0 x 3.7 cm = volume: 110 mL. No  hydronephrosis. Echogenic parenchyma with marked diffuse cortical thinning, a new finding since the 2013 that [place]. Multiple benign cysts as noted previously, the largest arising from the mid kidney measuring approximately 4.5 cm. No solid renal masses. No convincing evidence of nephrolithiasis. Bladder: Decompressed. Chronic wall thickening up to 6 mm, unchanged since the 2013 examination. BILATERAL ureteral jets visualized at color Doppler evaluation. Indentation of the base of the bladder by the prostate gland. IMPRESSION: 1. Echogenic parenchyma and diffuse cortical thinning involving both kidneys indicating chronic renal disease. 2. Multiple BILATERAL renal cysts as noted on the prior ultrasound in 2013. 3. No evidence of hydronephrosis involving either kidney to suggest obstruction as a cause of renal insufficiency. 4. Chronic thickening of the bladder wall, unchanged since the prior ultrasound in 2013, likely chronic outlet obstruction due to prostatomegaly. Electronically Signed   By: Evangeline Dakin M.D.   On: 07/26/2018 10:51       Today   Subjective:   Anthony Houston patient doing well denies any complaints  Objective:   Blood pressure 123/83, pulse 67, temperature 97.9 F (36.6 C), temperature source Oral, resp. rate 20, height 6' (1.829 m), weight 96.7 kg, SpO2 99 %.  .  Intake/Output Summary (Last 24 hours) at 08/01/2018 1305 Last data filed at 08/01/2018 1024 Gross per 24 hour  Intake 480 ml  Output 400 ml  Net 80 ml    Exam VITAL SIGNS: Blood pressure 123/83, pulse 67, temperature 97.9 F (36.6 C), temperature source Oral, resp. rate 20, height 6' (1.829 m), weight 96.7 kg, SpO2 99 %.  GENERAL:  68 y.o.-year-old patient lying in the bed with no acute distress.  EYES: Pupils equal, round, reactive to light and accommodation. No scleral icterus. Extraocular muscles intact.  HEENT: Head atraumatic, normocephalic. Oropharynx and nasopharynx clear.  NECK:  Supple, no  jugular venous distention. No thyroid enlargement, no tenderness.  LUNGS: Normal breath sounds bilaterally, no wheezing, rales,rhonchi or crepitation. No use of accessory muscles of respiration.  CARDIOVASCULAR: S1, S2 normal. No murmurs, rubs, or gallops.  ABDOMEN: Soft, nontender, nondistended. Bowel sounds present. No organomegaly or mass.  EXTREMITIES: No pedal edema, cyanosis, or clubbing.  NEUROLOGIC: Cranial nerves II through XII are intact. Muscle strength 5/5 in all extremities. Sensation intact. Gait not checked.  PSYCHIATRIC: The patient is alert and oriented x 3.  SKIN: No obvious rash, lesion, or ulcer.   Data Review     CBC w Diff:  Lab Results  Component Value Date   WBC 7.8 07/26/2018   HGB 12.2 (L) 07/26/2018   HGB 13.9 04/01/2012   HCT 36.9 (L) 07/26/2018   HCT 41.2 04/01/2012   PLT 197 07/26/2018   PLT 235 04/01/2012   LYMPHOPCT 26.4 04/01/2012   MONOPCT 9.9 04/01/2012   EOSPCT 2.0 04/01/2012   BASOPCT 0.4 04/01/2012   CMP:  Lab Results  Component Value Date   NA 142 07/26/2018   NA 141 04/02/2012   K 4.7 07/26/2018   K 3.8 04/02/2012   CL 109 07/26/2018   CL 105 04/02/2012   CO2 19 (L) 07/26/2018   CO2 24 04/02/2012   BUN 76 (H) 07/26/2018   BUN 14 04/02/2012   CREATININE 8.61 (H) 07/26/2018   CREATININE 1.65 (H) 04/02/2012   PROT 8.5 (H) 07/25/2018   PROT 7.7 03/30/2012   ALBUMIN 4.2 07/25/2018  ALBUMIN 2.8 (L) 03/30/2012   BILITOT 1.0 07/25/2018   BILITOT 0.9 03/30/2012   ALKPHOS 53 07/25/2018   ALKPHOS 79 03/30/2012   AST 16 07/25/2018   AST 16 03/30/2012   ALT 9 07/25/2018   ALT 22 03/30/2012  .  Micro Results Recent Results (from the past 240 hour(s))  MRSA PCR Screening     Status: None   Collection Time: 07/26/18  5:40 AM  Result Value Ref Range Status   MRSA by PCR NEGATIVE NEGATIVE Final    Comment:        The GeneXpert MRSA Assay (FDA approved for NASAL specimens only), is one component of a comprehensive MRSA  colonization surveillance program. It is not intended to diagnose MRSA infection nor to guide or monitor treatment for MRSA infections. Performed at [facility], [address] [place], [place] [address]         Code Status Orders  (From admission, onward)         Start     Ordered   07/26/18 0235  Full code  Continuous     07/26/18 0235        Code Status History    This patient has a current code status but no historical code status.          Follow-up Information    pcp Follow up in 1 week(s).           Discharge Medications   Allergies as of 08/01/2018      Reactions   Lisinopril       Medication List    TAKE these medications   amLODipine 5 MG tablet Commonly known as:  NORVASC Take 5 mg by mouth daily.   aspirin 81 MG chewable tablet Chew 81 mg by mouth daily.   calcitRIOL 0.25 MCG capsule Commonly known as:  ROCALTROL Take 0.25 mcg by mouth daily.   carvedilol 12.5 MG tablet Commonly known as:  COREG Take 12.5 mg by mouth 2 (two) times daily with a meal.   cholecalciferol 25 MCG (1000 UT) tablet Commonly known as:  VITAMIN D Take 1,000 Units by mouth daily.   COMBIGAN 0.2-0.5 % ophthalmic solution Generic drug:  brimonidine-timolol Place 1 drop into both eyes 2 (two) times daily.   diclofenac sodium 1 % Gel Commonly known as:  VOLTAREN Apply 4 g topically 4 (four) times daily.   donepezil 10 MG tablet Commonly known as:  ARICEPT Take 10 mg by mouth at bedtime.   entecavir 0.5 MG tablet Commonly known as:  BARACLUDE Take 0.5 mg by mouth every 3 (three) days.   latanoprost 0.005 % ophthalmic solution Commonly known as:  XALATAN Place 1 drop into both eyes at bedtime.   Melatonin 5 MG Tabs Take 5 mg by mouth at bedtime.   memantine 10 MG tablet Commonly known as:  NAMENDA Take 10 mg by mouth 2 (two) times daily.   MINERIN Crea Apply 1 application topically 2 (two) times daily.   Oyster Shell 500 MG  Tabs Take 1,000 mg by mouth daily.   pantoprazole 40 MG tablet Commonly known as:  PROTONIX Take 40 mg by mouth daily.   simvastatin 10 MG tablet Commonly known as:  ZOCOR Take 10 mg by mouth at bedtime.   spironolactone 25 MG tablet Commonly known as:  ALDACTONE Take 25 mg by mouth daily.   torsemide 20 MG tablet Commonly known as:  DEMADEX Take 80 mg by mouth daily.  Total Time in preparing paper work, data evaluation and todays exam - 65 minutes  Dustin Flock M.D on 08/01/2018 at [place]  [phone]

## 2018-08-01 NOTE — Progress Notes (Signed)
[place] [facility] [place] NAME: Anthony Houston    MR#:  [id number]  DATE OF BIRTH:  [birth year]  SUBJECTIVE:  CHIEF COMPLAINT:  He is awaiting hemodialysis spot currently asymptomatic   REVIEW OF SYSTEMS:    CONSTITUTIONAL: No documented fever.  Positive fatigue, positive weakness. No weight gain, no weight loss.  EYES: No blurry or double vision.  ENT: No tinnitus. No postnasal drip. No redness of the oropharynx.  RESPIRATORY: No cough, no wheeze, no hemoptysis. No dyspnea.  CARDIOVASCULAR: No chest pain. No orthopnea. No palpitations. No syncope.  GASTROINTESTINAL: No nausea, no vomiting or diarrhea. No abdominal pain. No melena or hematochezia.  GENITOURINARY:  No urgency. No frequency. No dysuria. No hematuria. No obstructive symptoms. No discharge. No pain. No significant abnormal bleeding ENDOCRINE: No polyuria or nocturia. No heat or cold intolerance.  HEMATOLOGY: No anemia. No bruising. No bleeding. No purpura. No petechiae INTEGUMENTARY: No rashes. No lesions.  MUSCULOSKELETAL: No arthritis. No swelling. No gout.  NEUROLOGIC: No numbness, tingling, or ataxia. No seizure-type activity.  PSYCHIATRIC: No anxiety. No insomnia. No ADD.     DRUG ALLERGIES:   Allergies  Allergen Reactions  . Lisinopril     VITALS:  Blood pressure 123/83, pulse 67, temperature 97.9 F (36.6 C), temperature source Oral, resp. rate 20, height 6' (1.829 m), weight 96.7 kg, SpO2 99 %.  PHYSICAL EXAMINATION:  GENERAL:  68 y.o.-year-old patient lying in the bed with no acute distress.  EYES: Pupils equal, round, reactive to light and accommodation. No scleral icterus. Extraocular muscles intact.  HEENT: Head atraumatic, normocephalic. Oropharynx and nasopharynx clear.  NECK:  Supple, no jugular venous distention. No thyroid enlargement, no tenderness.  LUNGS: Normal breath sounds bilaterally, no wheezing, rales,rhonchi or crepitation. No use of  accessory muscles of respiration.  CARDIOVASCULAR: S1, S2 normal. No murmurs, rubs, or gallops.  ABDOMEN: Soft, nontender, nondistended. Bowel sounds present. EXTREMITIES: No pedal edema, cyanosis, or clubbing.  NEUROLOGIC: Awake and alert. Sensation intact. Gait not checked.  PSYCHIATRIC: The patient is alert and alert SKIN: No obvious rash, lesion, or ulcer.    LABORATORY PANEL:   CBC Recent Labs  Lab 07/26/18 0541  WBC 7.8  HGB 12.2*  HCT 36.9*  PLT 197   ------------------------------------------------------------------------------------------------------------------  Chemistries  Recent Labs  Lab 07/25/18 1559 07/26/18 0541  NA 138 142  K 4.9 4.7  CL 104 109  CO2 21* 19*  GLUCOSE 100* 100*  BUN 71* 76*  CREATININE 8.35* 8.61*  CALCIUM 9.8 9.7  AST 16  --   ALT 9  --   ALKPHOS 53  --   BILITOT 1.0  --    ------------------------------------------------------------------------------------------------------------------  Cardiac Enzymes Recent Labs  Lab 07/25/18 1559  TROPONINI <0.03   ------------------------------------------------------------------------------------------------------------------  RADIOLOGY:  No results found.  EKG:   Orders placed or performed during the hospital encounter of 07/25/18  . ED EKG  . ED EKG  . EKG 12-Lead  . EKG 12-Lead    ASSESSMENT AND PLAN:    #End-stage renal disease He is awaiting spot for hemodialysis as outpatient, [facility] not [facility] hemod[facility] , nephrology will notify once this is available Tolerating HD well   #Generalized weakness probably secondary to uremia And by PT recommended outpatient PT   #History of multiple myeloma Outpatient follow-up with oncology as recommended  #Essential hypertension- Blood pressure is labile continue to monitor for now  #Dementia-he has some cognitive dysfunction  All the records  are reviewed and case discussed with Care Management/Social  Workerr. Management plans discussed with the patient, family and they are in agreement.  CODE STATUS: Full  TOTAL TIME TAKING CARE OF THIS PATIENT: 25 minutes.   POSSIBLE D/C IN 2-3  DAYS, DEPENDING ON CLINICAL CONDITION.  Note: This dictation was prepared with Dragon dictation along with smaller phrase technology. Any transcriptional errors that result from this process are unintentional.   Dustin Flock M.D on 08/01/2018 at 11:18 AM  Between 7am to 6pm - Pager - [phone] After 6pm go to [email or web address] - password [email or web address] [place] [facility]  Office  [phone]  CC: Primary care physician; System, Pcp Not In

## 2018-08-01 NOTE — Clinical Social Work Note (Signed)
Physician discharging patient to return to [facility]. CSW spoke with Rachel Bo at [facility] and they will be here to transport him about 3, 3:30. CSW left message for patient's brother. Shela Leff MSW,LcSW [phone]

## 2018-08-01 NOTE — NC FL2 (Signed)
[place] LEVEL OF CARE SCREENING TOOL     IDENTIFICATION  Patient Name: Anthony Houston Birthdate: [birth year] Sex: male Admission Date (Current Location): 07/25/2018  [facility] and [place] Number:  Engineering geologist and Address:  [facility], [address] [place], [place] [address]      Provider Number: [id number]  Attending Physician Name and Address:  Dustin Flock, MD  Relative Name and Phone Number:       Current Level of Care: Hospital Recommended Level of Care: [place] Prior Approval Number:    Date Approved/Denied:   PASRR Number:    Discharge Plan: (ALF)    Current Diagnoses: Patient Active Problem List   Diagnosis Date Noted  . HTN (hypertension) 07/26/2018  . CAD (coronary artery disease) 07/26/2018  . Weakness 07/26/2018  . ESRD needing dialysis ([place]) 07/26/2018    Orientation RESPIRATION BLADDER Height & Weight     Self, Time, Situation, Place  Normal Incontinent Weight: 213 lb 3 oz (96.7 kg) Height:  6' (182.9 cm)  BEHAVIORAL SYMPTOMS/MOOD NEUROLOGICAL BOWEL NUTRITION STATUS  (none) (none) Incontinent Diet(renal)  AMBULATORY STATUS COMMUNICATION OF NEEDS Skin   Limited Assist Verbally Normal                       Personal Care Assistance Level of Assistance  Bathing, Feeding, Dressing Bathing Assistance: Limited assistance Feeding assistance: Limited assistance Dressing Assistance: Limited assistance     Functional Limitations Info  (none)          SPECIAL CARE FACTORS FREQUENCY  (outpatient dialysis)                    Contractures Contractures Info: Not present    Additional Factors Info  Code Status Code Status Info: full             Discharge Medications        Allergies as of 08/01/2018      Reactions   Lisinopril          Medication List    TAKE these medications   amLODipine 5 MG tablet Commonly known as:  NORVASC Take 5 mg  by mouth daily.   aspirin 81 MG chewable tablet Chew 81 mg by mouth daily.   calcitRIOL 0.25 MCG capsule Commonly known as:  ROCALTROL Take 0.25 mcg by mouth daily.   carvedilol 12.5 MG tablet Commonly known as:  COREG Take 12.5 mg by mouth 2 (two) times daily with a meal.   cholecalciferol 25 MCG (1000 UT) tablet Commonly known as:  VITAMIN D Take 1,000 Units by mouth daily.   COMBIGAN 0.2-0.5 % ophthalmic solution Generic drug:  brimonidine-timolol Place 1 drop into both eyes 2 (two) times daily.   diclofenac sodium 1 % Gel Commonly known as:  VOLTAREN Apply 4 g topically 4 (four) times daily.   donepezil 10 MG tablet Commonly known as:  ARICEPT Take 10 mg by mouth at bedtime.   entecavir 0.5 MG tablet Commonly known as:  BARACLUDE Take 0.5 mg by mouth every 3 (three) days.   latanoprost 0.005 % ophthalmic solution Commonly known as:  XALATAN Place 1 drop into both eyes at bedtime.   Melatonin 5 MG Tabs Take 5 mg by mouth at bedtime.   memantine 10 MG tablet Commonly known as:  NAMENDA Take 10 mg by mouth 2 (two) times daily.   MINERIN Crea Apply 1 application topically 2 (two) times daily.  Oyster Shell 500 MG Tabs Take 1,000 mg by mouth daily.   pantoprazole 40 MG tablet Commonly known as:  PROTONIX Take 40 mg by mouth daily.   simvastatin 10 MG tablet Commonly known as:  ZOCOR Take 10 mg by mouth at bedtime.   spironolactone 25 MG tablet Commonly known as:  ALDACTONE Take 25 mg by mouth daily.   torsemide 20 MG tablet Commonly known as:  DEMADEX Take 80 mg by mouth daily.           Additional Information Outpatient HD: TTS at 11:05 am [place], [place]

## 2018-08-02 ENCOUNTER — Encounter: Payer: Self-pay | Admitting: Vascular Surgery

## 2019-05-04 ENCOUNTER — Emergency Department
Admission: EM | Admit: 2019-05-04 | Discharge: 2019-05-05 | Disposition: A | Discharge status: Home / Self Care | Payer: Medicaid Other | Attending: Emergency Medicine | Admitting: Emergency Medicine

## 2019-05-04 ENCOUNTER — Other Ambulatory Visit: Payer: Self-pay

## 2019-05-04 DIAGNOSIS — I2510 Atherosclerotic heart disease of native coronary artery without angina pectoris: Secondary | ICD-10-CM | POA: Insufficient documentation

## 2019-05-04 DIAGNOSIS — Z992 Dependence on renal dialysis: Secondary | ICD-10-CM | POA: Diagnosis not present

## 2019-05-04 DIAGNOSIS — F0390 Unspecified dementia without behavioral disturbance: Secondary | ICD-10-CM | POA: Diagnosis not present

## 2019-05-04 DIAGNOSIS — T8249XA Other complication of vascular dialysis catheter, initial encounter: Secondary | ICD-10-CM | POA: Diagnosis not present

## 2019-05-04 DIAGNOSIS — C9000 Multiple myeloma not having achieved remission: Secondary | ICD-10-CM | POA: Diagnosis not present

## 2019-05-04 DIAGNOSIS — Y828 Other medical devices associated with adverse incidents: Secondary | ICD-10-CM | POA: Diagnosis not present

## 2019-05-04 DIAGNOSIS — Z20828 Contact with and (suspected) exposure to other viral communicable diseases: Secondary | ICD-10-CM | POA: Insufficient documentation

## 2019-05-04 DIAGNOSIS — I120 Hypertensive chronic kidney disease with stage 5 chronic kidney disease or end stage renal disease: Secondary | ICD-10-CM | POA: Insufficient documentation

## 2019-05-04 DIAGNOSIS — Z7982 Long term (current) use of aspirin: Secondary | ICD-10-CM | POA: Insufficient documentation

## 2019-05-04 DIAGNOSIS — N186 End stage renal disease: Secondary | ICD-10-CM | POA: Insufficient documentation

## 2019-05-04 DIAGNOSIS — Z79899 Other long term (current) drug therapy: Secondary | ICD-10-CM | POA: Diagnosis not present

## 2019-05-04 DIAGNOSIS — Z789 Other specified health status: Secondary | ICD-10-CM

## 2019-05-04 NOTE — ED Notes (Signed)
Pt here via ems from the [facility] with c/o clotted dialysis catheter. Appears in NAD.

## 2019-05-04 NOTE — ED Provider Notes (Signed)
[facility] Emergency Department Provider Note   ____________________________________________   First MD Initiated Contact with Patient 05/04/19 2317     (approximate)  I have reviewed the triage vital signs and the nursing notes.   HISTORY  Chief Complaint Vascular Access Problem  Level V caveat: Limited by dementia  HPI Anthony Houston is a 68 y.o. male sent to the ED from dialysis clinic for vascular access problem.  Patient was unable to receive dialysis today due to the nurses unable to access his fistula.  Reportedly he was unable to be seen by vascular surgeon today or tomorrow so they sent him to the ED.  Patient voices no complaints.  History is limited secondary to dementia.       Past Medical History:  Diagnosis Date  . CAD (coronary artery disease)   . CKD (chronic kidney disease)   . Cognitive disorder   . Dementia ([place])   . 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 Glaucoma   . Hypertension   . Knee pain, left   . Multiple myeloma [facility])     Patient Active Problem List   Diagnosis Date Noted  . HTN (hypertension) 07/26/2018  . CAD (coronary artery disease) 07/26/2018  . Weakness 07/26/2018  . ESRD needing dialysis ([place]) 07/26/2018    Past Surgical History:  Procedure Laterality Date  . ABDOMINAL SURGERY  w/in 10 years   to remove cancer   . DIALYSIS/PERMA CATHETER INSERTION N/A 07/26/2018   Procedure: DIALYSIS/PERMA CATHETER INSERTION;  Surgeon: Katha Cabal, MD;  Location: [place] [facility];  Service: Cardiovascular;  Laterality: N/A;  . NO PAST SURGERIES      Prior to Admission medications   Medication Sig Start Date End Date Taking? Authorizing Provider  amLODipine (NORVASC) 5 MG tablet Take 5 mg by mouth daily.    [provider]  aspirin 81 MG chewable tablet Chew 81 mg by mouth daily.    [provider]  brimonidine-timolol (COMBIGAN) 0.2-0.5 % ophthalmic solution Place  1 drop into both eyes 2 (two) times daily.    [provider]  calcitRIOL (ROCALTROL) 0.25 MCG capsule Take 0.25 mcg by mouth daily.    [provider]  carvedilol (COREG) 12.5 MG tablet Take 12.5 mg by mouth 2 (two) times daily with a meal.    [provider]  cholecalciferol (VITAMIN D) 25 MCG (1000 UT) tablet Take 1,000 Units by mouth daily.    [provider]  diclofenac sodium (VOLTAREN) 1 % GEL Apply 4 g topically 4 (four) times daily.    [provider]  donepezil (ARICEPT) 10 MG tablet Take 10 mg by mouth at bedtime.    [provider]  entecavir (BARACLUDE) 0.5 MG tablet Take 0.5 mg by mouth every 3 (three) days.    [provider]  latanoprost (XALATAN) 0.005 % ophthalmic solution Place 1 drop into both eyes at bedtime.    [provider]  Melatonin 5 MG TABS Take 5 mg by mouth at bedtime.    [provider]  memantine (NAMENDA) 10 MG tablet Take 10 mg by mouth 2 (two) times daily.    [provider]  Oyster Shell 500 MG TABS Take 1,000 mg by mouth daily.    [provider]  pantoprazole (PROTONIX) 40 MG tablet Take 40 mg by mouth daily.    [provider]  simvastatin (ZOCOR) 10 MG tablet Take 10 mg by mouth  at bedtime.    [provider]  Skin Protectants, Misc. (MINERIN) CREA Apply 1 application topically 2 (two) times daily.    [provider]  spironolactone (ALDACTONE) 25 MG tablet Take 25 mg by mouth daily.    [provider]  torsemide (DEMADEX) 20 MG tablet Take 80 mg by mouth daily.    [provider]    Allergies Lisinopril  No family history on file.  Social History Social History   Tobacco Use  . Smoking status: Never Smoker  . Smokeless tobacco: Never Used  Substance Use Topics  . Alcohol use: Not Currently    Frequency: Never  . Drug use: Never    Review of Systems  Constitutional: Positive for vascular access  problem.  No fever/chills Eyes: No visual changes. ENT: No sore throat. Cardiovascular: Denies chest pain. Respiratory: Denies shortness of breath. Gastrointestinal: No abdominal pain.  No nausea, no vomiting.  No diarrhea.  No constipation. Genitourinary: Negative for dysuria. Musculoskeletal: Negative for back pain. Skin: Negative for rash. Neurological: Negative for headaches, focal weakness or numbness.   ____________________________________________   PHYSICAL EXAM:  VITAL SIGNS: ED Triage Vitals  Enc Vitals Group     BP 05/04/19 1806 121/90     Pulse Rate 05/04/19 1806 66     Resp 05/04/19 1806 18     Temp 05/04/19 1806 98.2 F (36.8 C)     Temp Source 05/04/19 1806 Oral     SpO2 05/04/19 1806 99 %     Weight 05/04/19 1807 215 lb (97.5 kg)     Height 05/04/19 1807 6' (1.829 m)     Head Circumference --      Peak Flow --      Pain Score 05/04/19 1807 0     Pain Loc --      Pain Edu? --      Excl. in [place]? --     Constitutional: Alert and oriented. Well appearing and in no acute distress. Eyes: Conjunctivae are normal. PERRL. EOMI. Head: Atraumatic. Nose: No congestion/rhinnorhea. Mouth/Throat: Mucous membranes are moist.  Oropharynx non-erythematous. Neck: No stridor.   Cardiovascular: Normal rate, regular rhythm. Grossly normal heart sounds.  Good peripheral circulation. Respiratory: Normal respiratory effort.  No retractions. Lungs CTAB. Gastrointestinal: Soft and nontender. No distention. No abdominal bruits. No CVA tenderness. Musculoskeletal: LUE AV fistula.  No lower extremity tenderness nor edema.  No joint effusions. Neurologic:  Normal speech and language. No gross focal neurologic deficits are appreciated. No gait instability. Skin:  Skin is warm, dry and intact. No rash noted. Psychiatric: Mood and affect are normal. Speech and behavior are normal.  ____________________________________________   LABS (all labs ordered are listed, but only abnormal  results are displayed)  Labs Reviewed  CBC WITH DIFFERENTIAL/PLATELET - Abnormal; Notable for the following components:      Result Value   RBC 3.56 (*)    Hemoglobin 10.0 (*)    HCT 31.1 (*)    All other components within normal limits  BASIC METABOLIC PANEL - Abnormal; Notable for the following components:   Glucose, Bld 108 (*)    BUN 35 (*)    Creatinine, Ser 6.55 (*)    GFR calc non Af Amer 8 (*)    GFR calc Af Amer 9 (*)    All other components within normal limits   ____________________________________________  EKG  None ____________________________________________  RADIOLOGY  ED MD interpretation: None  Official radiology report(s): No results found.  ____________________________________________  PROCEDURES  Procedure(s) performed (including Critical Care):  Procedures   ____________________________________________   INITIAL IMPRESSION / ASSESSMENT AND PLAN / ED COURSE  As part of my medical decision making, I reviewed the following data within the [place] notes reviewed and incorporated, Old chart reviewed and Notes from prior ED visits     Anthony Houston was evaluated in Emergency Department on 05/05/2019 for the symptoms described in the history of present illness. He was evaluated in the context of the global COVID-19 pandemic, which necessitated consideration that the patient might be at risk for infection with the SARS-CoV-2 virus that causes COVID-19. Institutional protocols and algorithms that pertain to the evaluation of patients at risk for COVID-19 are in a state of rapid change based on information released by regulatory bodies including the CDC and federal and state organizations. These policies and algorithms were followed during the patient's care in the ED.    68 year old male sent to the ED for vascular access problem.  On-call dialysis nurse contacted; awaiting callback.   Clinical Course as of May 04 733  Fri May 05, 2019  0009 No call back from dialysis nurse on call.  Spoke with Dr. Delana Meyer from vascular surgery who is happy to see patient in the office but situation is complicated by the fact that patient has dementia as well as lives at assisted living facility.  Will be best to board patient in the ED overnight and have dialysis nurse try to access his fistula in the morning.  If unable to access, patient may require special procedures.   [JS]  0732 No further events. Spoke with on-call dialysis nurse Charlie who will have someone check patient's vascular access this morning. Care transferred to Dr. Ellender Hose at change of shift.   [JS]    Clinical Course User Index [JS] Paulette Blanch, MD     ____________________________________________   FINAL CLINICAL IMPRESSION(S) / ED DIAGNOSES  Final diagnoses:  Problem with vascular access     ED Discharge Orders    None       Note:  This document was prepared using Dragon voice recognition software and may include unintentional dictation errors.   Paulette Blanch, MD 05/05/19 [id number]

## 2019-05-04 NOTE — ED Triage Notes (Addendum)
Pt reports that he went to dialysis today and they were unable to do his treatment due to "dialysis catheter problem". States that they were unable to get blood. Reports he had dialysis earlier in week with no difficulty. Pt was unable to be seen by a vascular doctor today or schedule for tomorrow so sent to ER. Pt lives at [facility], see provided paperwork fur further. Pt alert and oriented to place, situation and person, cooperative, RR even and unlabored, color WNL. Pt in NAD. Pt disoriented to time, with hx of dementia per medical hx.

## 2019-05-05 ENCOUNTER — Encounter: Payer: Self-pay | Admitting: *Deleted

## 2019-05-05 ENCOUNTER — Other Ambulatory Visit (INDEPENDENT_AMBULATORY_CARE_PROVIDER_SITE_OTHER): Payer: Self-pay | Admitting: Vascular Surgery

## 2019-05-05 ENCOUNTER — Encounter
Admission: EM | Disposition: A | Discharge status: Home / Self Care | Payer: Self-pay | Source: Home / Self Care | Attending: Emergency Medicine

## 2019-05-05 DIAGNOSIS — Z992 Dependence on renal dialysis: Secondary | ICD-10-CM

## 2019-05-05 DIAGNOSIS — T82898A Other specified complication of vascular prosthetic devices, implants and grafts, initial encounter: Secondary | ICD-10-CM

## 2019-05-05 DIAGNOSIS — N186 End stage renal disease: Secondary | ICD-10-CM

## 2019-05-05 HISTORY — PX: DIALYSIS/PERMA CATHETER INSERTION: CATH118288

## 2019-05-05 HISTORY — PX: PERIPHERAL VASCULAR THROMBECTOMY: CATH118306

## 2019-05-05 LAB — BASIC METABOLIC PANEL
Anion gap: 11 (ref 5–15)
BUN: 35 mg/dL — ABNORMAL HIGH (ref 8–23)
CO2: 29 mmol/L (ref 22–32)
Calcium: 9 mg/dL (ref 8.9–10.3)
Chloride: 101 mmol/L (ref 98–111)
Creatinine, Ser: 6.55 mg/dL — ABNORMAL HIGH (ref 0.61–1.24)
GFR calc Af Amer: 9 mL/min — ABNORMAL LOW (ref >60)
GFR calc non Af Amer: 8 mL/min — ABNORMAL LOW (ref >60)
Glucose, Bld: 108 mg/dL — ABNORMAL HIGH (ref 70–99)
Potassium: 4.5 mmol/L (ref 3.5–5.1)
Sodium: 141 mmol/L (ref 135–145)

## 2019-05-05 LAB — CBC WITH DIFFERENTIAL/PLATELET
Abs Immature Granulocytes: 0.03 10*3/uL (ref 0.00–0.07)
Basophils Absolute: 0 10*3/uL (ref 0.0–0.1)
Basophils Relative: 1 %
Eosinophils Absolute: 0.2 10*3/uL (ref 0.0–0.5)
Eosinophils Relative: 3 %
HCT: 31.1 % — ABNORMAL LOW (ref 39.0–52.0)
Hemoglobin: 10 g/dL — ABNORMAL LOW (ref 13.0–17.0)
Immature Granulocytes: 1 %
Lymphocytes Relative: 30 %
Lymphs Abs: 2 10*3/uL (ref 0.7–4.0)
MCH: 28.1 pg (ref 26.0–34.0)
MCHC: 32.2 g/dL (ref 30.0–36.0)
MCV: 87.4 fL (ref 80.0–100.0)
Monocytes Absolute: 0.6 10*3/uL (ref 0.1–1.0)
Monocytes Relative: 9 %
Neutro Abs: 3.7 10*3/uL (ref 1.7–7.7)
Neutrophils Relative %: 56 %
Platelets: 229 10*3/uL (ref 150–400)
RBC: 3.56 MIL/uL — ABNORMAL LOW (ref 4.22–5.81)
RDW: 14.1 % (ref 11.5–15.5)
WBC: 6.5 10*3/uL (ref 4.0–10.5)
nRBC: 0 % (ref 0.0–0.2)

## 2019-05-05 LAB — SARS CORONAVIRUS 2 BY RT PCR (HOSPITAL ORDER, PERFORMED IN ~~LOC~~ HOSPITAL LAB): SARS Coronavirus 2: NEGATIVE

## 2019-05-05 SURGERY — DIALYSIS/PERMA CATHETER INSERTION
Anesthesia: Moderate Sedation

## 2019-05-05 MED ORDER — HYDROMORPHONE HCL 1 MG/ML IJ SOLN: 1.0000 mg | Freq: Once | INTRAMUSCULAR | Status: DC | PRN

## 2019-05-05 MED ORDER — SODIUM CHLORIDE 0.9 % IV SOLN
INTRAVENOUS | Status: DC
  Administered 2019-05-05: 16:00:00 via INTRAVENOUS

## 2019-05-05 MED ORDER — HEPARIN SODIUM (PORCINE) 1000 UNIT/ML IJ SOLN
INTRAMUSCULAR | Status: AC
  Filled 2019-05-05: qty 1

## 2019-05-05 MED ORDER — HEPARIN SODIUM (PORCINE) 1000 UNIT/ML IJ SOLN
INTRAMUSCULAR | Status: DC | PRN
  Administered 2019-05-05: 4000 [IU] via INTRAVENOUS

## 2019-05-05 MED ORDER — CEFAZOLIN SODIUM-DEXTROSE 1-4 GM/50ML-% IV SOLN
1.0000 g | Freq: Once | INTRAVENOUS | Status: AC
  Administered 2019-05-05: 15:00:00 1 g via INTRAVENOUS
  Filled 2019-05-05: qty 50

## 2019-05-05 MED ORDER — MIDAZOLAM HCL 5 MG/5ML IJ SOLN
INTRAMUSCULAR | Status: AC
  Filled 2019-05-05: qty 5

## 2019-05-05 MED ORDER — FENTANYL CITRATE (PF) 100 MCG/2ML IJ SOLN
INTRAMUSCULAR | Status: AC
  Filled 2019-05-05: qty 2

## 2019-05-05 MED ORDER — METHYLPREDNISOLONE SODIUM SUCC 125 MG IJ SOLR
125.0000 mg | Freq: Once | INTRAMUSCULAR | Status: DC | PRN
  Filled 2019-05-05: qty 2

## 2019-05-05 MED ORDER — ONDANSETRON HCL 4 MG/2ML IJ SOLN: 4.0000 mg | Freq: Four times a day (QID) | INTRAMUSCULAR | Status: DC | PRN

## 2019-05-05 MED ORDER — MIDAZOLAM HCL 2 MG/2ML IJ SOLN
INTRAMUSCULAR | Status: DC | PRN
  Administered 2019-05-05: 1 mg via INTRAVENOUS

## 2019-05-05 MED ORDER — IODIXANOL 320 MG/ML IV SOLN
INTRAVENOUS | Status: DC | PRN
  Administered 2019-05-05: 55 mL via INTRAVENOUS

## 2019-05-05 MED ORDER — FAMOTIDINE 20 MG PO TABS: 40.0000 mg | ORAL_TABLET | Freq: Once | ORAL | Status: DC | PRN

## 2019-05-05 MED ORDER — MIDAZOLAM HCL 2 MG/ML PO SYRP
8.0000 mg | ORAL_SOLUTION | Freq: Once | ORAL | Status: DC | PRN
  Filled 2019-05-05: qty 4

## 2019-05-05 MED ORDER — DIPHENHYDRAMINE HCL 50 MG/ML IJ SOLN: 50.0000 mg | Freq: Once | INTRAMUSCULAR | Status: DC | PRN

## 2019-05-05 MED ORDER — FENTANYL CITRATE (PF) 100 MCG/2ML IJ SOLN
INTRAMUSCULAR | Status: DC | PRN
  Administered 2019-05-05: 50 ug via INTRAVENOUS

## 2019-05-05 MED ORDER — CEFAZOLIN SODIUM-DEXTROSE 1-4 GM/50ML-% IV SOLN
INTRAVENOUS | Status: AC
  Administered 2019-05-05: 15:00:00 1 g via INTRAVENOUS
  Filled 2019-05-05: qty 50

## 2019-05-05 SURGICAL SUPPLY — 21 items
BALLN DORADO 8X40X80 (BALLOONS) ×3
BALLN LUTONIX DCB 5X40X130 (BALLOONS) ×3
BALLOON DORADO 8X40X80 (BALLOONS) IMPLANT
BALLOON LUTONIX DCB 5X40X130 (BALLOONS) IMPLANT
CATH BEACON 5 .035 65 KMP TIP (CATHETERS) ×1 IMPLANT
COVER PROBE U/S 5X48 (MISCELLANEOUS) ×1 IMPLANT
DEVICE PRESTO INFLATION (MISCELLANEOUS) ×1 IMPLANT
DEVICE TORQUE .025-.038 (MISCELLANEOUS) ×1 IMPLANT
DRAPE BRACHIAL (DRAPES) ×1 IMPLANT
GUIDEWIRE ANGLED .035 180CM (WIRE) ×1 IMPLANT
KIT THROMB PERC PTD (MISCELLANEOUS) ×1 IMPLANT
NDL ENTRY 21GA 7CM ECHOTIP (NEEDLE) IMPLANT
NEEDLE ENTRY 21GA 7CM ECHOTIP (NEEDLE) ×3 IMPLANT
PACK ANGIOGRAPHY (CUSTOM PROCEDURE TRAY) ×3 IMPLANT
SET INTRO CAPELLA COAXIAL (SET/KITS/TRAYS/PACK) ×1 IMPLANT
SHEATH BRITE TIP 6FRX5.5 (SHEATH) ×2 IMPLANT
SHEATH BRITE TIP 7FRX5.5 (SHEATH) ×1 IMPLANT
STENT COVERA FLARED 9X60X80 (Permanent Stent) ×1 IMPLANT
SUT MNCRL AB 4-0 PS2 18 (SUTURE) ×1 IMPLANT
TOWEL OR 17X26 4PK STRL BLUE (TOWEL DISPOSABLE) ×1 IMPLANT
WIRE MAGIC TORQUE 260C (WIRE) ×1 IMPLANT

## 2019-05-05 NOTE — Consult Note (Signed)
[facility] Admission History & Physical  MRN : [id number]  Anthony Houston is a 68 y.o. (Jul 25, 1950) male who presents with chief complaint of  Chief Complaint  Patient presents with  . Vascular Access Problem   History of Present Illness:  The patient is a 68 year old male with multiple medical issues including known end-stage renal disease who was sent to the [facility] emergency department by his dialysis center due to difficulties cannulating his left upper extremity dialysis access.  Most recent procedure completed by our service: 07/26/18: 1. Insertion of tunneled dialysis catheter right IJ approach with ultrasound and fluoroscopic guidance.  Furthermore the dialysis center states there is no thrill or bruit. The patient states this is the first dialysis run to be missed. This problem is acute in onset and has been present for approximately 2 days. The patient is unaware of any other change.  Patient denies pain or tenderness overlying the access.  There is no pain with dialysis. The patient denies hand pain or finger pain consistent with steal syndrome.  Denies any uremic symptoms.  Vascular surgery was consulted by Dr. Beather Arbour for further recommendations. Current Facility-Administered Medications  Medication Dose Route Frequency Provider Last Rate Last Dose  . 0.9 %  sodium chloride infusion   Intravenous Continuous Stegmayer, Kimberly A, PA-C      . ceFAZolin (ANCEF) 1-4 GM/50ML-% IVPB           . [START ON 05/06/2019] ceFAZolin (ANCEF) IVPB 1 g/50 mL premix  1 g Intravenous Once Stegmayer, Kimberly A, PA-C      . diphenhydrAMINE (BENADRYL) injection 50 mg  50 mg Intravenous Once PRN Stegmayer, Kimberly A, PA-C      . famotidine (PEPCID) tablet 40 mg  40 mg Oral Once PRN Stegmayer, Janalyn Harder, PA-C      . HYDROmorphone (DILAUDID) injection 1 mg  1 mg Intravenous Once PRN Stegmayer, Kimberly A, PA-C      . methylPREDNISolone  sodium succinate (SOLU-MEDROL) 125 mg/2 mL injection 125 mg  125 mg Intravenous Once PRN Stegmayer, Kimberly A, PA-C      . midazolam (VERSED) 2 MG/ML syrup 8 mg  8 mg Oral Once PRN Stegmayer, Kimberly A, PA-C      . ondansetron (ZOFRAN) injection 4 mg  4 mg Intravenous Q6H PRN Stegmayer, Janalyn Harder, PA-C       Current Outpatient Medications  Medication Sig Dispense Refill  . amLODipine (NORVASC) 5 MG tablet Take 5 mg by mouth daily.    Marland Kitchen aspirin 81 MG chewable tablet Chew 81 mg by mouth daily.    . brimonidine-timolol (COMBIGAN) 0.2-0.5 % ophthalmic solution Place 1 drop into both eyes 2 (two) times daily.    . calcitRIOL (ROCALTROL) 0.25 MCG capsule Take 0.25 mcg by mouth daily.    . carvedilol (COREG) 12.5 MG tablet Take 12.5 mg by mouth 2 (two) times daily with a meal.    . cholecalciferol (VITAMIN D) 25 MCG (1000 UT) tablet Take 1,000 Units by mouth daily.    . diclofenac sodium (VOLTAREN) 1 % GEL Apply 4 g topically 4 (four) times daily.    Marland Kitchen donepezil (ARICEPT) 10 MG tablet Take 10 mg by mouth at bedtime.    Marland Kitchen entecavir (BARACLUDE) 0.5 MG tablet Take 0.5 mg by mouth every 3 (three) days.    Marland Kitchen latanoprost (XALATAN) 0.005 % ophthalmic solution Place 1 drop into both eyes at bedtime.    . Melatonin 5 MG TABS Take  5 mg by mouth at bedtime.    . memantine (NAMENDA) 10 MG tablet Take 10 mg by mouth 2 (two) times daily.    [place] Spark Shell 500 MG TABS Take 1,000 mg by mouth daily.    . pantoprazole (PROTONIX) 40 MG tablet Take 40 mg by mouth daily.    . simvastatin (ZOCOR) 10 MG tablet Take 10 mg by mouth at bedtime.    . Skin Protectants, Misc. (MINERIN) CREA Apply 1 application topically 2 (two) times daily.    Marland Kitchen spironolactone (ALDACTONE) 25 MG tablet Take 25 mg by mouth daily.    Marland Kitchen torsemide (DEMADEX) 20 MG tablet Take 80 mg by mouth daily.     Past Medical History:  Diagnosis Date  . CAD (coronary artery disease)   . CKD (chronic kidney disease)   . Cognitive disorder   . Dementia  ([place])   . 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 Glaucoma   . Hypertension   . Knee pain, left   . Multiple myeloma [facility])    Past Surgical History:  Procedure Laterality Date  . ABDOMINAL SURGERY  w/in 10 years   to remove cancer   . DIALYSIS/PERMA CATHETER INSERTION N/A 07/26/2018   Procedure: DIALYSIS/PERMA CATHETER INSERTION;  Surgeon: Katha Cabal, MD;  Location: [place] [facility];  Service: Cardiovascular;  Laterality: N/A;  . NO PAST SURGERIES     Social History Social History   Tobacco Use  . Smoking status: Never Smoker  . Smokeless tobacco: Never Used  Substance Use Topics  . Alcohol use: Not Currently    Frequency: Never  . Drug use: Never   Family History History reviewed. No pertinent family history.   No family history of bleeding or clotting disorders, autoimmune disease or porphyria  Allergies  Allergen Reactions  . Lisinopril    REVIEW OF SYSTEMS (Negative unless checked)  Constitutional: [] Weight loss  [] Fever  [] Chills Cardiac: [] Chest pain   [] Chest pressure   [] Palpitations   [] Shortness of breath when laying flat   [] Shortness of breath at rest   [] Shortness of breath with exertion. Vascular:  [] Pain in legs with walking   [] Pain in legs at rest   [] Pain in legs when laying flat   [] Claudication   [] Pain in feet when walking  [] Pain in feet at rest  [] Pain in feet when laying flat   [] History of DVT   [] Phlebitis   [] Swelling in legs   [] Varicose veins   [] Non-healing ulcers Pulmonary:   [] Uses home oxygen   [] Productive cough   [] Hemoptysis   [] Wheeze  [] COPD   [] Asthma Neurologic:  [] Dizziness  [] Blackouts   [] Seizures   [] History of stroke   [] History of TIA  [] Aphasia   [] Temporary blindness   [] Dysphagia   [] Weakness or numbness in arms   [] Weakness or numbness in legs Musculoskeletal:  [] Arthritis   [] Joint swelling   [] Joint pain   [] Low back pain Hematologic:  [] Easy bruising  [] Easy bleeding   [] Hypercoagulable state    [] Anemic  [] Hepatitis Gastrointestinal:  [] Blood in stool   [] Vomiting blood  [] Gastroesophageal reflux/heartburn   [] Difficulty swallowing. Genitourinary:  [x] Chronic kidney disease   [] Difficult urination  [] Frequent urination  [] Burning with urination   [] Blood in urine Skin:  [] Rashes   [] Ulcers   [] Wounds Psychological:  [] History of anxiety   []  History of major depression.  Physical Examination  Vitals:   05/05/19 0806 05/05/19 1207 05/05/19 1353  05/05/19 1414  BP: (!) 141/82 137/79 (!) 160/86 (!) 144/93  Pulse: 69 71 69 76  Resp:  18 20 20   Temp: 98.7 F (37.1 C) 98.7 F (37.1 C)  98.5 F (36.9 C)  TempSrc: Oral Oral  Oral  SpO2: 98% 97% 99% 99%  Weight:    97.5 kg  Height:    6' (1.829 m)   Body mass index is 29.15 kg/m. Gen: WD/WN, NAD Head: [place]/AT, No temporalis wasting. Prominent temp pulse not noted. Ear/Nose/Throat: Hearing grossly intact, nares w/o erythema or drainage, oropharynx w/o Erythema/Exudate,  Eyes: Conjunctiva clear, sclera non-icteric Neck: Trachea midline.  No JVD.  Pulmonary:  Good air movement, respirations not labored, no use of accessory muscles.  Cardiac: RRR, normal S1, S2. Vascular:  Vessel Right Left  Radial Palpable Palpable  Ulnar Not Palpable Not Palpable  Brachial Palpable Palpable  Carotid Palpable, without bruit Palpable, without bruit   Left Upper Extremity: Extremity soft, warm distally to fingers. No bruit or thrill noted in access. Skin intact.   Gastrointestinal: soft, non-tender/non-distended. No guarding/reflex.  Musculoskeletal: M/S 5/5 throughout.  Extremities without ischemic changes.  No deformity or atrophy.  Neurologic: Sensation grossly intact in extremities.  Symmetrical.  Speech is fluent. Motor exam as listed above. Psychiatric: Judgment intact, Mood & affect appropriate for pt's clinical situation. Dermatologic: No rashes or ulcers noted.  No cellulitis or open wounds. Lymph : No Cervical, Axillary, or Inguinal  lymphadenopathy.  CBC Lab Results  Component Value Date   WBC 6.5 05/04/2019   HGB 10.0 (L) 05/04/2019   HCT 31.1 (L) 05/04/2019   MCV 87.4 05/04/2019   PLT 229 05/04/2019   BMET    Component Value Date/Time   NA 141 05/04/2019 2344   NA 141 04/02/2012 0955   K 4.5 05/04/2019 2344   K 3.8 04/02/2012 0955   CL 101 05/04/2019 2344   CL 105 04/02/2012 0955   CO2 29 05/04/2019 2344   CO2 24 04/02/2012 0955   GLUCOSE 108 (H) 05/04/2019 2344   GLUCOSE 161 (H) 04/02/2012 0955   BUN 35 (H) 05/04/2019 2344   BUN 14 04/02/2012 0955   CREATININE 6.55 (H) 05/04/2019 2344   CREATININE 1.65 (H) 04/02/2012 0955   CALCIUM 9.0 05/04/2019 2344   CALCIUM 9.3 04/02/2012 0955   GFRNONAA 8 (L) 05/04/2019 2344   GFRNONAA 44 (L) 04/02/2012 0955   GFRAA 9 (L) 05/04/2019 2344   GFRAA 50 (L) 04/02/2012 0955   Estimated Creatinine Clearance: 12.7 mL/min (A) (by C-G formula based on SCr of 6.55 mg/dL (H)).  COAG No results found for: INR, PROTIME  Radiology No results found.  Assessment/Plan The patient is a 68 year old male with multiple medical issues including known end-stage renal disease who was sent to the [facility] emergency department by his dialysis center due to difficulties cannulating his left upper extremity dialysis access.  1.  Complication dialysis device with thrombosis AV access:  Patient's LUE dialysis access is possibly thrombosed. The patient will undergo thrombectomy using interventional techniques.  The risks and benefits were described to the patient.  All questions were answered.  The patient agrees to proceed with angiography and intervention. Additional consent from family member was able to be obtained. Call was placed to brother who did not answer. A message was left. Emergency consent was obtained. Potassium will be drawn to ensure that it is an appropriate level prior to performing thrombectomy. 2.  End-stage renal disease requiring  hemodialysis:  Patient will  continue dialysis therapy without further interruption if a successful thrombectomy is not achieved then catheter will be placed. Dialysis has already been arranged since the patient missed their previous session 3.  Hypertension:  Patient will continue medical management; nephrology is following no changes in oral medications. 4.  Coronary artery disease:  EKG will be monitored. Nitrates will be used if needed. The patient's oral cardiac medications will be continued.  Discussed with Dr. Francene Castle, PA-C  05/05/2019 2:36 PM

## 2019-05-05 NOTE — ED Notes (Signed)
Pt cleansed of urine, brief changed, bed sheets replaced

## 2019-05-05 NOTE — ED Notes (Signed)
Pt waiting for procedure. Remains NPO. NAD. VSS.  No needs at this time, pt watching tv.

## 2019-05-05 NOTE — Progress Notes (Addendum)
Left arm fistula with positive bruit no thrill. Pt denies pain at this time. Site remains warm to touch. Small blister present lateral to dressing.

## 2019-05-05 NOTE — Op Note (Signed)
OPERATIVE NOTE   PROCEDURE: 1. Mechanical thrombectomy left brachial axillary AV graft. 2. Percutaneous transluminal angioplasty and stent placement venous anastomosis left brachial axillary AV graft. 3. Percutaneous transluminal angioplasty left brachial axillary anastomosis. 4. Contrast injection left radial axillary AV graft  PRE-OPERATIVE DIAGNOSIS: Complication of dialysis access                                                       End Stage Renal Disease  POST-OPERATIVE DIAGNOSIS: same as above   SURGEON: Katha Cabal, M.D.  ANESTHESIA: Conscious sedation was administered by the radiology RN under my direct supervision. IV Versed plus fentanyl were utilized. Continuous ECG, pulse oximetry and blood pressure was monitored throughout the entire procedure. Conscious sedation was for a total of 40 minutes.    ESTIMATED BLOOD LOSS: minimal  FINDING(S): 1. Thrombus with a greater than 90% stenosis at the venous anastomosis and a greater than 70% stenosis at the arterial anastomosis  SPECIMEN(S):  None  CONTRAST: 30 cc  FLUOROSCOPY TIME: 5.8 minutes  INDICATIONS: Anthony Houston is a 68 y.o. male who  presents with thrombosed left arm AV access.  The patient is scheduled for angiography with possible intervention of the AV access.  The patient is aware the risks include but are not limited to: bleeding, infection, thrombosis of the cannulated access, and possible anaphylactic reaction to the contrast.  The patient acknowledges if the access can not be salvaged a tunneled catheter will be needed and will be placed during this procedure.  The patient is aware of the risks of the procedure and elects to proceed with the angiogram and intervention.  DESCRIPTION: After full informed written consent was obtained, the patient was brought back to the Special Procedure suite and placed supine position.  Appropriate cardiopulmonary monitors were placed.  The left arm was prepped and  draped in the standard fashion.  Appropriate timeout is called. The left brachial axillary AV graft was cannulated with a micropuncture needle using ultrasound guidance.  With the ultrasound the AV access appeared to be filled with heterogeneous material and was poorly compressible indicating thrombosis of the AV access. The puncture was made under direct ultrasound visualization and an image was recorded for the permanent record.  The microwire was advanced and the needle was exchanged for  a microsheath.  The J-wire was then advanced and a 6 Fr sheath inserted.  Hand was then performed which demonstrated thrombus within the AV access.  The central venous structures were also imaged by hand injections.  3000 units of heparin was given and allowed to circulate as well.  A Trerotola device was then advanced beginning centrally and pulling back performing.  Several passes were made through the venous portion of the graft. Follow-up imaging now demonstrates the vast majority of the clot had been treated. Therefore a retrograde sheath was inserted. This was a 6 [place] sheath and was positioned more proximally on the arm and angled in the retrograde direction. The left brachial axillary AV graft was cannulated with a micropuncture needle using ultrasound guidance.  With the ultrasound the AV access appeared to be filled with heterogeneous material and was poorly compressible indicating thrombosis of the AV access. The puncture was made under direct ultrasound visualization and an image was recorded for the permanent record.  Subsequently a floppy  Glidewire and a KMP catheter were negotiated into the arterial system hand injection contrast was then utilized to demonstrate patency of the artery as well as the location for the anastomosis. The Trerotola device was now advanced through the retrograde sheath was extended out into the artery the basket was opened and it was slowly pulled back into the graft and then the  basket was engaged. Several passes were made on the arterial portion and pulsatility of the access was reestablished. Follow-up imaging demonstrates there was now thrombus in the venous portion surrounding the sheath and this was treated with the Trerotola device from the antegrade direction. After several passes imaging demonstrated resolution of thrombus within the graft and forward flow however stricture of the graft was noted area  Magic torque wire was then advanced through the antegrade sheath and a 9 x 60 flared Covera stent was deployed across the venous anastomosis it was then dilated with an 8 x 6 [place] balloon.  A single inflation was performed to 30 atm for approximately 1 minute.  Follow-up imaging demonstrated less than 5% residual stenosis and attention was then turned to the arterial anastomosis of the graft.  Once again the Kumpe catheter is introduced and the Magic torque wire was negotiated into the brachial artery.  A 5 mm x 40 mm Lutonix drug-eluting balloon was advanced across the anastomosis inflated to 12 atm for 1 minute.  Follow-up imaging through the Kumpe catheter which was exchanged over the Magic torque wire demonstrated wide patency of the arterial anastomosis as well as the graft itself stent placed at the venous anastomosis and venous outflow was widely patent.  Central veins were preserved  A 4-0 Monocryl purse-string suture was sewn around both of the sheaths.  The sheaths were removed and light pressure was applied.  A sterile bandage was applied to the puncture site.    COMPLICATIONS: None  CONDITION: Improved  Katha Cabal, M.D  [facility] Office: [phone]  05/05/2019 4:27 PM

## 2019-05-05 NOTE — ED Notes (Addendum)
Transported by interventional RN to vascular for dialysis access declotting

## 2019-05-05 NOTE — ED Notes (Addendum)
Urine sent to lab 

## 2019-05-05 NOTE — Progress Notes (Signed)
Established hemodialysis patient known at [facility] TTS 11:30, lives at [facility] and transports with [place]. Due to a clotted access patient missed one dialysis treatment this week. Plan is for patient see vascular at 4:00pm today. Per Dr. Juleen China patient should me able to discharge and resume normal dialysis schedule tomorrow at 11:30. Clinic is aware of this plan as well.   Elvera Bicker Dialysis Coordinator [phone]

## 2019-05-05 NOTE — Progress Notes (Signed)
Left upper arm fistula site assessed pre procedure. Site warm to touch. Mild swelling. Pt denies pain. No thrill or bruit.

## 2019-05-05 NOTE — ED Notes (Signed)
Report received from end-shift RN. Patient care assumed. Patient/RN introduction complete. Will continue to monitor. Pt awaiting to have dialysis shunt declotted in the AM. At this time pt is watching tv without any complaints. Will continue to monitor, call bed at bedside.

## 2019-05-05 NOTE — ED Notes (Signed)
Pt given warm blanket.

## 2019-05-05 NOTE — ED Notes (Signed)
Pt watching tv, resting comfortably without complaints.

## 2019-05-05 NOTE — Progress Notes (Signed)
Patient appears to have a tunneled catheter for dialysis access.  We will plan catheter exchange in special procedures at some point today.  Looking at the schedule there appear to be 5 or 6 cases scheduled with several by interventional radiology so I am unable to give a time estimate at this point.  Please continue to keep the patient n.p.o.

## 2019-05-05 NOTE — ED Notes (Signed)
Report given to Val, RN

## 2019-05-05 NOTE — ED Notes (Addendum)
Pt given remote- no complaints at this time- updated on plan of care

## 2019-05-05 NOTE — Progress Notes (Signed)
[facility]   Placed a call to patients contact listed in chart: Dwaine Gale [facility]) [phone]  Left message asking for call back to [phone].  Marcelle Overlie PA-C 05/05/2019 1:21 PM

## 2019-05-08 ENCOUNTER — Encounter: Payer: Self-pay | Admitting: Vascular Surgery

## 2019-05-16 ENCOUNTER — Other Ambulatory Visit (INDEPENDENT_AMBULATORY_CARE_PROVIDER_SITE_OTHER): Payer: Self-pay | Admitting: Vascular Surgery

## 2019-05-16 DIAGNOSIS — T829XXS Unspecified complication of cardiac and vascular prosthetic device, implant and graft, sequela: Secondary | ICD-10-CM

## 2019-05-16 DIAGNOSIS — Z95820 Peripheral vascular angioplasty status with implants and grafts: Secondary | ICD-10-CM

## 2019-05-16 DIAGNOSIS — N186 End stage renal disease: Secondary | ICD-10-CM

## 2019-05-17 ENCOUNTER — Other Ambulatory Visit: Payer: Self-pay

## 2019-05-17 ENCOUNTER — Ambulatory Visit (INDEPENDENT_AMBULATORY_CARE_PROVIDER_SITE_OTHER): Payer: Medicaid Other | Admitting: Nurse Practitioner

## 2019-05-17 ENCOUNTER — Ambulatory Visit (INDEPENDENT_AMBULATORY_CARE_PROVIDER_SITE_OTHER): Payer: Medicaid Other

## 2019-05-17 ENCOUNTER — Encounter (INDEPENDENT_AMBULATORY_CARE_PROVIDER_SITE_OTHER): Payer: Self-pay | Admitting: Nurse Practitioner

## 2019-05-17 VITALS — BP 96/61 | HR 69 | Resp 16

## 2019-05-17 DIAGNOSIS — N186 End stage renal disease: Secondary | ICD-10-CM

## 2019-05-17 DIAGNOSIS — I10 Essential (primary) hypertension: Secondary | ICD-10-CM

## 2019-05-17 DIAGNOSIS — Z992 Dependence on renal dialysis: Secondary | ICD-10-CM

## 2019-05-17 DIAGNOSIS — Z95820 Peripheral vascular angioplasty status with implants and grafts: Secondary | ICD-10-CM | POA: Diagnosis not present

## 2019-05-17 DIAGNOSIS — T829XXS Unspecified complication of cardiac and vascular prosthetic device, implant and graft, sequela: Secondary | ICD-10-CM

## 2019-05-17 NOTE — Progress Notes (Signed)
SUBJECTIVE:  Patient ID: Anthony Houston, male    DOB: 1951-05-29, 68 y.o.   MRN: [id number] Chief Complaint  Patient presents with  . Follow-up    [facility] 2-3week HDA    HPI  Anthony Houston is a 68 y.o. male The patient returns to the office for followup status post intervention of the dialysis access left brachial axillary graft. Following the intervention the access function has significantly improved, with better flow rates and improved KT/V. The patient has not been experiencing increased bleeding times following decannulation and the patient denies increased recirculation. The patient denies an increase in arm swelling. At the present time the patient denies hand pain.  The patient denies amaurosis fugax or recent TIA symptoms. There are no recent neurological changes noted. The patient denies claudication symptoms or rest pain symptoms. The patient denies history of DVT, PE or superficial thrombophlebitis. The patient denies recent episodes of angina or shortness of breath.   The left brachial axillary graft underwent thrombectomy on 05/05/2019, there is a flow volume of 1790.  The AV graft has good flow no evidence of stenosis.     Past Medical History:  Diagnosis Date  . CAD (coronary artery disease)   . CKD (chronic kidney disease)   . Cognitive disorder   . Dementia ([place])   . 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 Glaucoma   . Hypertension   . Knee pain, left   . Multiple myeloma [facility])     Past Surgical History:  Procedure Laterality Date  . ABDOMINAL SURGERY  w/in 10 years   to remove cancer   . DIALYSIS/PERMA CATHETER INSERTION N/A 07/26/2018   Procedure: DIALYSIS/PERMA CATHETER INSERTION;  Surgeon: Katha Cabal, MD;  Location: [place] [facility];  Service: Cardiovascular;  Laterality: N/A;  . DIALYSIS/PERMA CATHETER INSERTION N/A 05/05/2019   Procedure: DIALYSIS/PERMA CATHETER EXCHANGE;  Surgeon: Katha Cabal, MD;   Location: [place] [facility];  Service: Cardiovascular;  Laterality: N/A;  . NO PAST SURGERIES    . PERIPHERAL VASCULAR THROMBECTOMY Left 05/05/2019   Procedure: PERIPHERAL VASCULAR THROMBECTOMY;  Surgeon: Katha Cabal, MD;  Location: [place] [facility];  Service: Cardiovascular;  Laterality: Left;    Social History   Socioeconomic History  . Marital status: Married    Spouse name: [name]   . Number of children: 2  . Years of education: Not on file  . Highest education level: Not on file  Occupational History  . Occupation: retired    Comment: Psychologist, prison and probation services  Social Needs  . Financial resource strain: Not on file  . Food insecurity    Worry: Not on file    Inability: Not on file  . Transportation needs    Medical: Not on file    Non-medical: Not on file  Tobacco Use  . Smoking status: Never Smoker  . Smokeless tobacco: Never Used  Substance and Sexual Activity  . Alcohol use: Not Currently    Frequency: Never  . Drug use: Never  . Sexual activity: Not Currently  Lifestyle  . Physical activity    Days per week: Not on file    Minutes per session: Not on file  . Stress: Not on file  Relationships  . Social Herbalist on phone: Not on file    Gets together: Not on file    Attends religious service: Not on file    Active member of club  or organization: Not on file    Attends meetings of clubs or organizations: Not on file    Relationship status: Not on file  . Intimate partner violence    Fear of current or ex partner: Not on file    Emotionally abused: Not on file    Physically abused: Not on file    Forced sexual activity: Not on file  Other Topics Concern  . Not on file  Social History Narrative  . Not on file    Family History  Problem Relation Age of Onset  . Varicose Veins Neg Hx     Allergies  Allergen Reactions  . Lisinopril      Review of Systems   Review of Systems: Negative Unless Checked Constitutional: [] Weight  loss  [] Fever  [] Chills Cardiac: [] Chest pain   []  Atrial Fibrillation  [] Palpitations   [] Shortness of breath when laying flat   [] Shortness of breath with exertion. [] Shortness of breath at rest Vascular:  [] Pain in legs with walking   [] Pain in legs with standing [] Pain in legs when laying flat   [] Claudication    [] Pain in feet when laying flat    [] History of DVT   [] Phlebitis   [] Swelling in legs   [] Varicose veins   [] Non-healing ulcers Pulmonary:   [] Uses home oxygen   [] Productive cough   [] Hemoptysis   [] Wheeze  [] COPD   [] Asthma Neurologic:  [] Dizziness   [] Seizures  [] Blackouts [] History of stroke   [] History of TIA  [] Aphasia   [] Temporary Blindness   [] Weakness or numbness in arm   [] Weakness or numbness in leg Musculoskeletal:   [] Joint swelling   [] Joint pain   [] Low back pain  []  History of Knee Replacement [] Arthritis [] back Surgeries  []  Spinal Stenosis    Hematologic:  [] Easy bruising  [] Easy bleeding   [] Hypercoagulable state   [x] Anemic Gastrointestinal:  [] Diarrhea   [] Vomiting  [] Gastroesophageal reflux/heartburn   [] Difficulty swallowing. [] Abdominal pain Genitourinary:  [x] Chronic kidney disease   [] Difficult urination  [] Anuric   [] Blood in urine [] Frequent urination  [] Burning with urination   [] Hematuria Skin:  [] Rashes   [] Ulcers [] Wounds Psychological:  [] History of anxiety   []  History of major depression  []  Memory Difficulties      OBJECTIVE:   Physical Exam  BP 96/61 (BP Location: Right Arm)   Pulse 69   Resp 16   Gen: WD/WN, NAD Head: [place]/AT, No temporalis wasting.  Ear/Nose/Throat: Hearing grossly intact, nares w/o erythema or drainage Eyes: PER, EOMI, sclera nonicteric.  Neck: Supple, no masses.  No JVD.  Pulmonary:  Good air movement, no use of accessory muscles.  Cardiac: RRR Vascular: good thrill and bruit Vessel Right Left  Radial Palpable Palpable   Gastrointestinal: soft, non-distended. No guarding/no peritoneal signs.  Musculoskeletal: M/S  5/5 throughout.  No deformity or atrophy.  Neurologic: Pain and light touch intact in extremities.  Symmetrical.  Speech is fluent. Motor exam as listed above. Psychiatric: Judgment intact, Mood & affect appropriate for pt's clinical situation. Dermatologic: No Venous rashes. No Ulcers Noted.  No changes consistent with cellulitis. Lymph : No Cervical lymphadenopathy, no lichenification or skin changes of chronic lymphedema.       ASSESSMENT AND PLAN:  1. ESRD needing dialysis [facility]) Recommend:  The patient is doing well and currently has adequate dialysis access. The patient's dialysis center is not reporting any access issues. Flow pattern is stable when compared to the prior ultrasound.  The patient should have a  duplex ultrasound of the dialysis access in 6 months. The patient will follow-up with me in the office after each ultrasound     2. Essential hypertension Continue antihypertensive medications as already ordered, these medications have been reviewed and there are no changes at this time.    Current Outpatient Medications on File Prior to Visit  Medication Sig Dispense Refill  . amLODipine (NORVASC) 5 MG tablet Take 5 mg by mouth daily.    Marland Kitchen aspirin 81 MG chewable tablet Chew 81 mg by mouth daily.    . brimonidine-timolol (COMBIGAN) 0.2-0.5 % ophthalmic solution Place 1 drop into both eyes 2 (two) times daily.    . calcitRIOL (ROCALTROL) 0.25 MCG capsule Take 0.25 mcg by mouth daily.    . carvedilol (COREG) 12.5 MG tablet Take 12.5 mg by mouth 2 (two) times daily with a meal.    . cholecalciferol (VITAMIN D) 25 MCG (1000 UT) tablet Take 1,000 Units by mouth daily.    . diclofenac sodium (VOLTAREN) 1 % GEL Apply 4 g topically 4 (four) times daily.    Marland Kitchen donepezil (ARICEPT) 10 MG tablet Take 10 mg by mouth at bedtime.    Marland Kitchen entecavir (BARACLUDE) 0.5 MG tablet Take 0.5 mg by mouth every 3 (three) days.    Marland Kitchen latanoprost (XALATAN) 0.005 % ophthalmic solution Place 1 drop into  both eyes at bedtime.    . lidocaine-prilocaine (EMLA) cream Apply 1 application topically as needed.    . Melatonin 5 MG TABS Take 5 mg by mouth at bedtime.    . memantine (NAMENDA) 5 MG tablet Take 5 mg by mouth 2 (two) times daily.    Heide Spark Shell 500 MG TABS Take 1,000 mg by mouth daily.    . pantoprazole (PROTONIX) 40 MG tablet Take 40 mg by mouth daily.    . simvastatin (ZOCOR) 10 MG tablet Take 10 mg by mouth at bedtime.    . Skin Protectants, Misc. (MINERIN) CREA Apply 1 application topically 2 (two) times daily.    Marland Kitchen spironolactone (ALDACTONE) 25 MG tablet Take 25 mg by mouth daily.    Marland Kitchen torsemide (DEMADEX) 20 MG tablet Take 80 mg by mouth daily.    . memantine (NAMENDA) 10 MG tablet Take 10 mg by mouth 2 (two) times daily.     No current facility-administered medications on file prior to visit.     There are no Patient Instructions on file for this visit. No follow-ups on file.   Kris Hartmann, NP  This note was completed with Sales executive.  Any errors are purely unintentional.

## 2019-05-23 IMAGING — CT CT HEAD W/O CM
3 series · 15 of 47 positions shown, 18 images · non-contrast
Comparison: None

CLINICAL DATA: Generalized muscle weakness

EXAM:
CT HEAD WITHOUT CONTRAST
TECHNIQUE: Contiguous axial images were obtained from the base of the skull
through the vertex without intravenous contrast. Sagittal and
coronal MPR images reconstructed from axial data set.

[Series 3: head wo · axial · 0.42mm/px · z∈[-150,-25]mm · 9 of 30 slices shown, 12 images]
[im 3/30  brain]
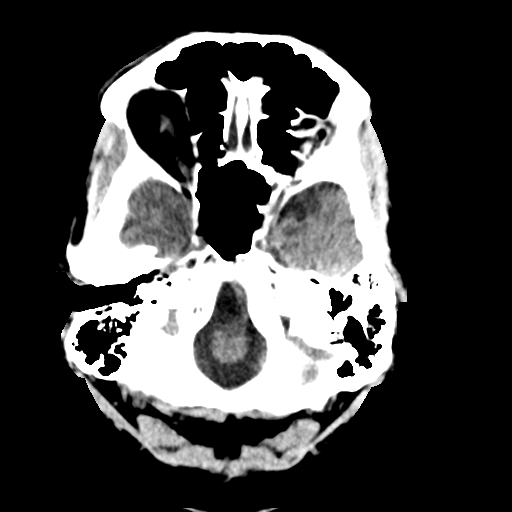
[im 3/30  bone]
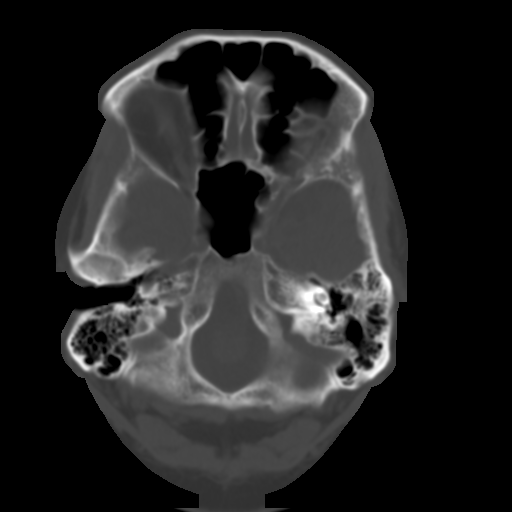
[im 6/30  brain]
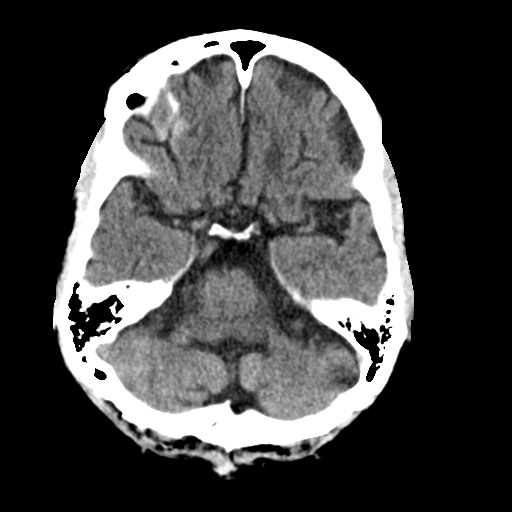
[im 9/30  brain]
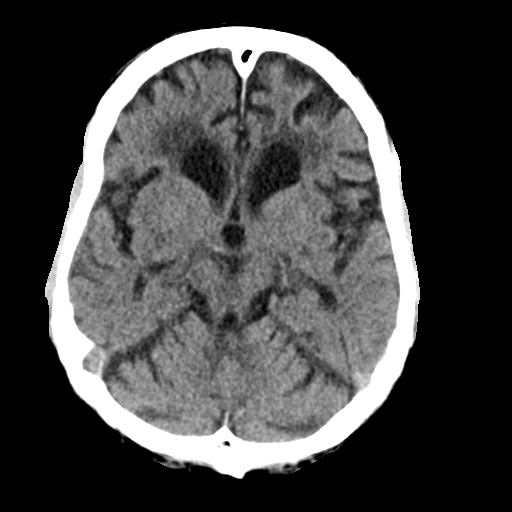
[im 12/30  brain]
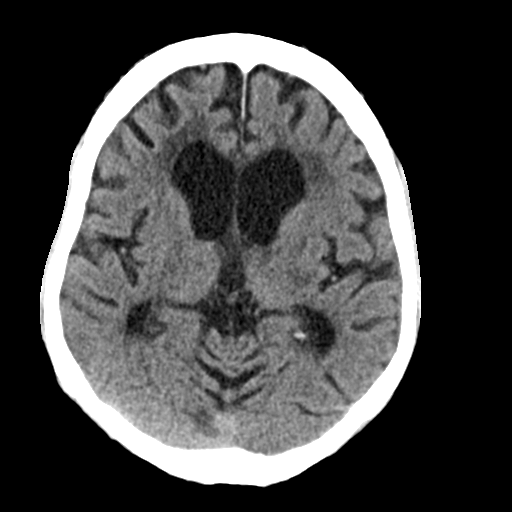
[im 16/30  brain]
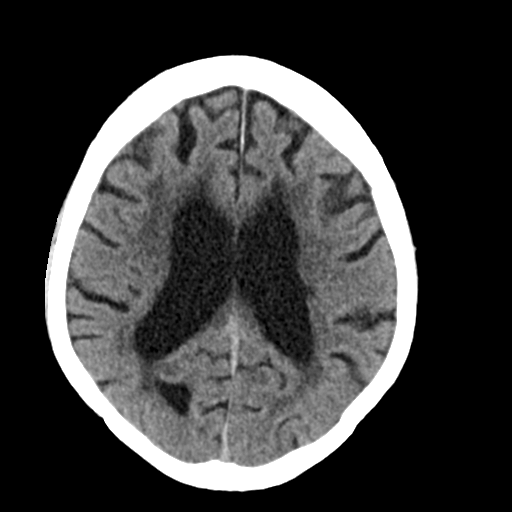
[im 16/30  bone]
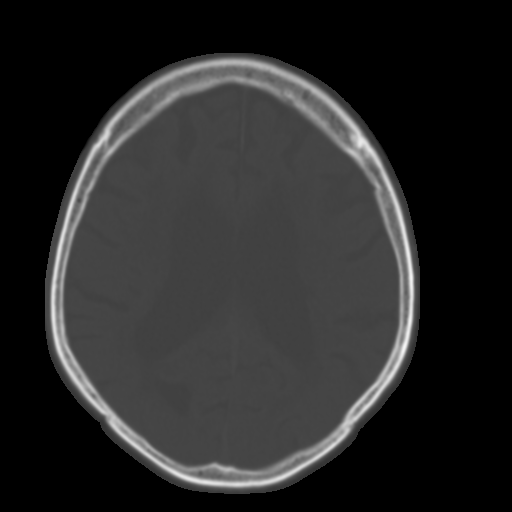
[im 19/30  brain]
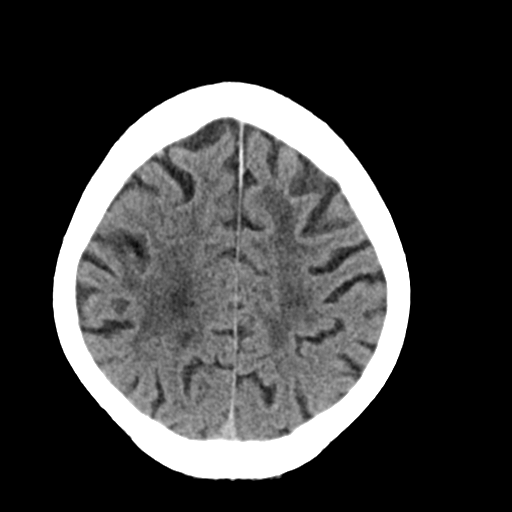
[im 22/30  brain]
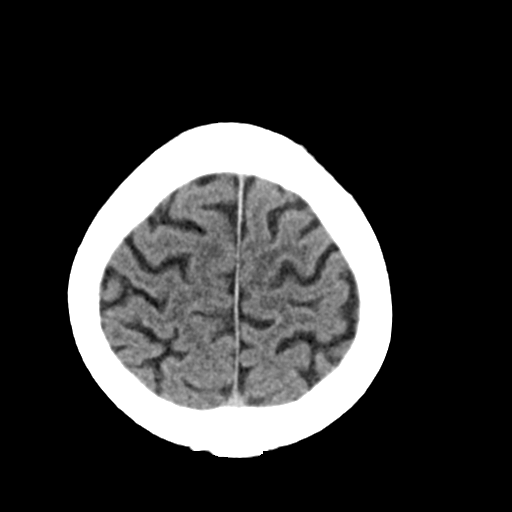
[im 25/30  brain]
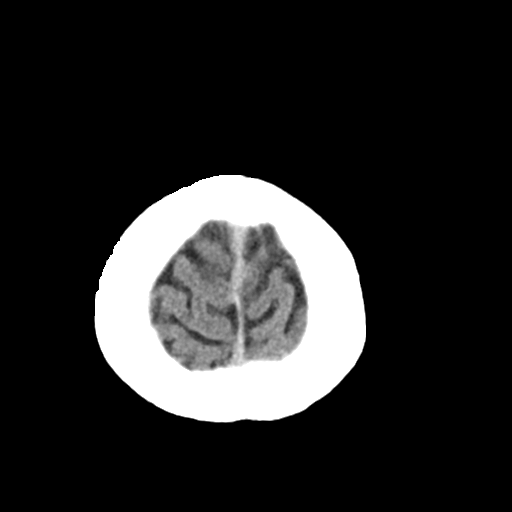
[im 28/30  brain]
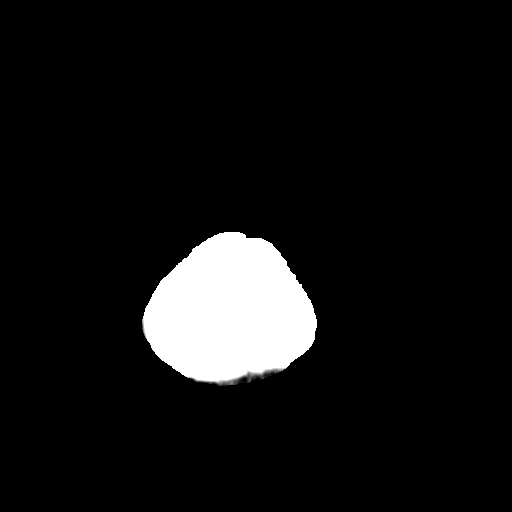
[im 28/30  bone]
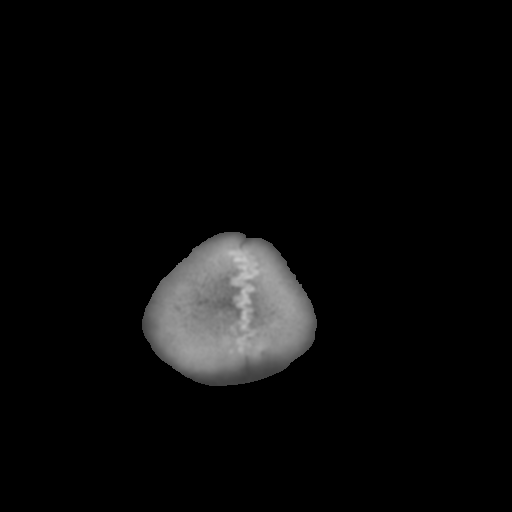

[Series 4: coronal soft tissue · coronal · 0.29mm/px · 3 of 65 slices shown]
[im 22/65  brain]
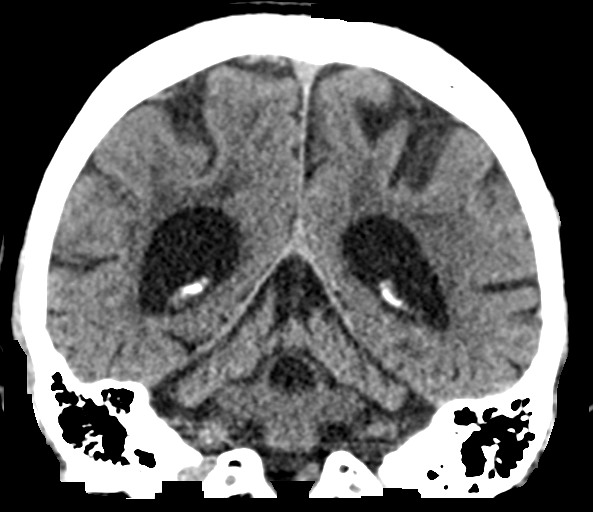
[im 29/65  brain]
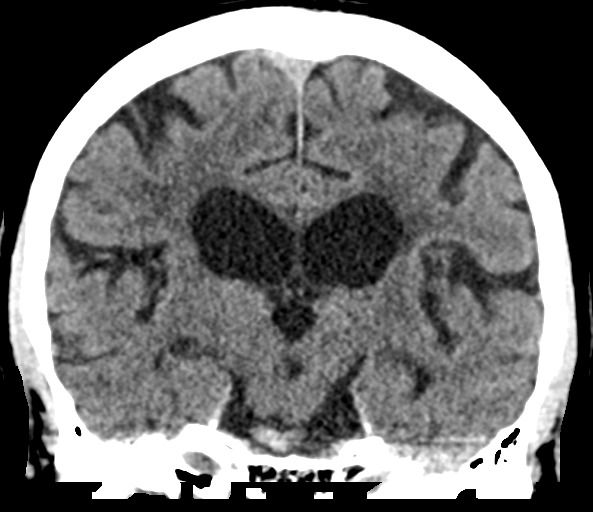
[im 36/65  brain]
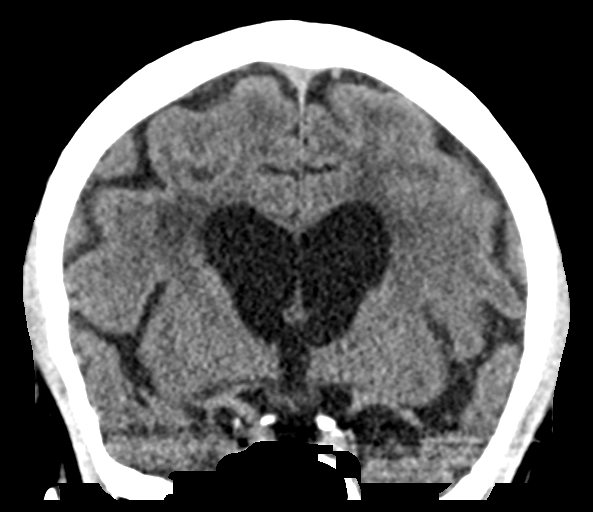

[Series 5: sagittal soft tissue · sagittal · 0.29mm/px · 3 of 57 slices shown]
[im 19/57  brain]
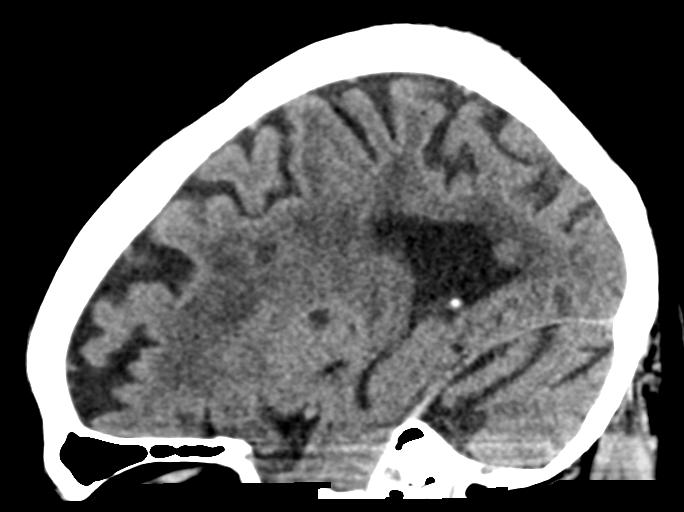
[im 29/57  brain]
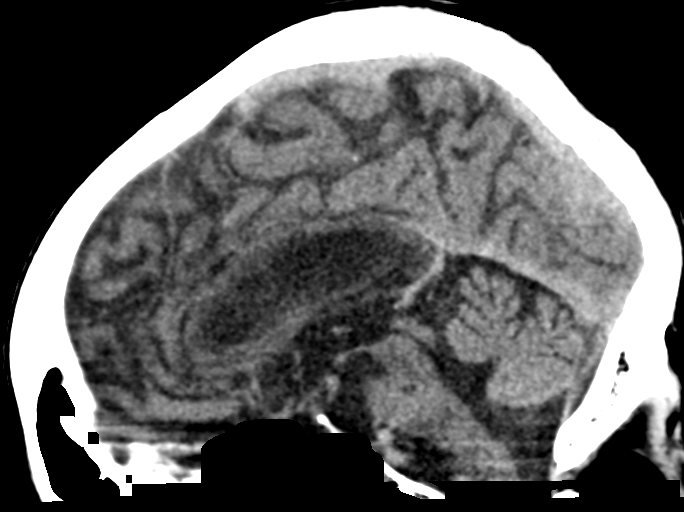
[im 38/57  brain]
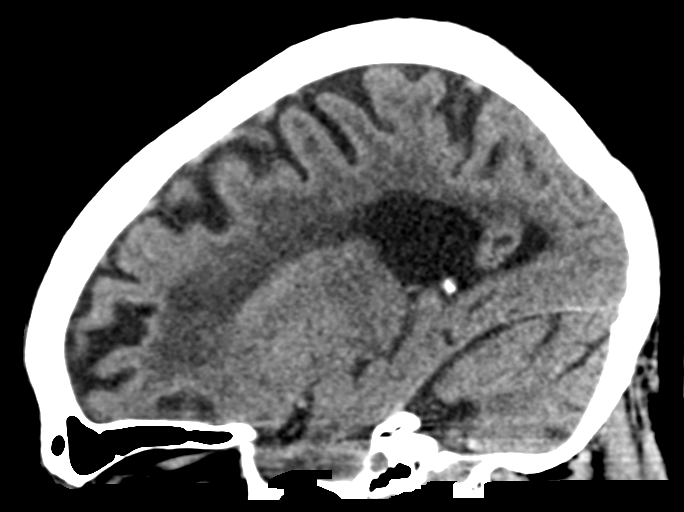

[15 of 47 positions shown; findings below may reference images not displayed]

FINDINGS: Brain: Generalized atrophy with ex vacuo dilatation of the
ventricular system. No midline shift or mass effect. Small vessel
chronic ischemic changes of deep cerebral white matter. Old lacunar
infarct LEFT basal ganglia. No intracranial hemorrhage, mass lesion,
or evidence of acute infarction. No extra-axial fluid collection.

Vascular: Minimal atherosclerotic calcifications of internal carotid
arteries at skull base

Skull: Demineralized but intact

Sinuses/Orbits: Clear

Other: N/A
IMPRESSION: Atrophy with small vessel chronic ischemic changes of deep cerebral
white matter.

Old LEFT basal ganglia lacunar infarct.

No acute intracranial abnormalities.

## 2019-11-08 ENCOUNTER — Other Ambulatory Visit (INDEPENDENT_AMBULATORY_CARE_PROVIDER_SITE_OTHER): Payer: Self-pay | Admitting: Nurse Practitioner

## 2019-11-08 DIAGNOSIS — Z992 Dependence on renal dialysis: Secondary | ICD-10-CM

## 2019-11-08 DIAGNOSIS — N186 End stage renal disease: Secondary | ICD-10-CM

## 2019-11-20 ENCOUNTER — Encounter (INDEPENDENT_AMBULATORY_CARE_PROVIDER_SITE_OTHER): Payer: Medicare Other

## 2019-11-20 ENCOUNTER — Ambulatory Visit (INDEPENDENT_AMBULATORY_CARE_PROVIDER_SITE_OTHER): Payer: Medicaid Other | Admitting: Vascular Surgery

## 2019-11-29 ENCOUNTER — Ambulatory Visit (INDEPENDENT_AMBULATORY_CARE_PROVIDER_SITE_OTHER): Payer: Medicaid Other | Admitting: Nurse Practitioner

## 2019-11-29 ENCOUNTER — Other Ambulatory Visit: Payer: Self-pay

## 2019-11-29 ENCOUNTER — Ambulatory Visit (INDEPENDENT_AMBULATORY_CARE_PROVIDER_SITE_OTHER): Payer: Medicaid Other

## 2019-11-29 ENCOUNTER — Encounter (INDEPENDENT_AMBULATORY_CARE_PROVIDER_SITE_OTHER): Payer: Self-pay | Admitting: Nurse Practitioner

## 2019-11-29 VITALS — BP 132/69 | HR 62 | Ht 72.0 in

## 2019-11-29 DIAGNOSIS — N186 End stage renal disease: Secondary | ICD-10-CM | POA: Diagnosis not present

## 2019-11-29 DIAGNOSIS — I10 Essential (primary) hypertension: Secondary | ICD-10-CM

## 2019-11-29 DIAGNOSIS — Z992 Dependence on renal dialysis: Secondary | ICD-10-CM | POA: Diagnosis not present

## 2019-11-29 NOTE — Progress Notes (Signed)
Subjective:    Patient ID: Anthony Houston, male    DOB: 1950/11/28, 69 y.o.   MRN: [id number] Chief Complaint  Patient presents with  . Follow-up    U/S Follow up    The patient returns to the office for followup of their dialysis access. The function of the access has been stable. The patient denies increased bleeding time or increased recirculation. Patient denies difficulty with cannulation. The patient denies hand pain or other symptoms consistent with steal phenomena.  No significant arm swelling.  The patient denies redness or swelling at the access site. The patient denies fever or chills at home or while on dialysis.  The patient denies amaurosis fugax or recent TIA symptoms. There are no recent neurological changes noted. The patient denies claudication symptoms or rest pain symptoms. The patient denies history of DVT, PE or superficial thrombophlebitis. The patient denies recent episodes of angina or shortness of breath.     Today the patient has a flow volume of 4307.  This is increased from previous flow volume of 1790 in 05/2019.  The patient's left brachial axillary AV graft has no evidence of significant stenosis.   Review of Systems  Respiratory: Negative for shortness of breath.   Cardiovascular: Negative for leg swelling.  Neurological: Positive for weakness.  All other systems reviewed and are negative.      Objective:   Physical Exam Vitals reviewed.  Constitutional:      Appearance: Normal appearance.  HENT:     Head: Normocephalic.  Cardiovascular:     Rate and Rhythm: Normal rate and regular rhythm.     Pulses: Normal pulses.     Heart sounds: Normal heart sounds.     Arteriovenous access: left arteriovenous access is present.    Comments: Good thrill and bruit Pulmonary:     Effort: Pulmonary effort is normal.     Breath sounds: Normal breath sounds.  Musculoskeletal:     Comments: Wheelchair for ambulation  Neurological:     Mental  Status: He is alert and oriented to person, place, and time.  Psychiatric:        Mood and Affect: Mood normal.        Behavior: Behavior normal.        Thought Content: Thought content normal.        Judgment: Judgment normal.     BP 132/69   Pulse 62   Ht 6' (1.829 m)   BMI 29.15 kg/m   Past Medical History:  Diagnosis Date  . CAD (coronary artery disease)   . CKD (chronic kidney disease)   . Cognitive disorder   . Dementia ([facility])   . 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 Glaucoma   . Hypertension   . Knee pain, left   . Multiple myeloma ([facility])     Social History   Socioeconomic History  . Marital status: Married    Spouse name: [name]   . Number of children: 2  . Years of education: Not on file  . Highest education level: Not on file  Occupational History  . Occupation: retired    Comment: Psychologist, prison and probation services  Tobacco Use  . Smoking status: Never Smoker  . Smokeless tobacco: Never Used  Substance and Sexual Activity  . Alcohol use: Not Currently  . Drug use: Never  . Sexual activity: Not Currently  Other Topics Concern  . Not on file  Social History Narrative  .  Not on file   Social Determinants of Health   Financial Resource Strain:   . Difficulty of Paying Living Expenses:   Food Insecurity:   . Worried About Charity fundraiser in the Last Year:   . Arboriculturist in the Last Year:   Transportation Needs:   . Film/video editor (Medical):   Marland Kitchen Lack of Transportation (Non-Medical):   Physical Activity:   . Days of Exercise per Week:   . Minutes of Exercise per Session:   Stress:   . Feeling of Stress :   Social Connections:   . Frequency of Communication with Friends and Family:   . Frequency of Social Gatherings with Friends and Family:   . Attends Religious Services:   . Active Member of Clubs or Organizations:   . Attends Archivist Meetings:   Marland Kitchen Marital Status:   Intimate Partner Violence:   . Fear of  Current or Ex-Partner:   . Emotionally Abused:   Marland Kitchen Physically Abused:   . Sexually Abused:     Past Surgical History:  Procedure Laterality Date  . ABDOMINAL SURGERY  w/in 10 years   to remove cancer   . DIALYSIS/PERMA CATHETER INSERTION N/A 07/26/2018   Procedure: DIALYSIS/PERMA CATHETER INSERTION;  Surgeon: Katha Cabal, MD;  Location: [facility];  Service: Cardiovascular;  Laterality: N/A;  . DIALYSIS/PERMA CATHETER INSERTION N/A 05/05/2019   Procedure: DIALYSIS/PERMA CATHETER EXCHANGE;  Surgeon: Katha Cabal, MD;  Location: [facility];  Service: Cardiovascular;  Laterality: N/A;  . NO PAST SURGERIES    . PERIPHERAL VASCULAR THROMBECTOMY Left 05/05/2019   Procedure: PERIPHERAL VASCULAR THROMBECTOMY;  Surgeon: Katha Cabal, MD;  Location: [facility];  Service: Cardiovascular;  Laterality: Left;    Family History  Problem Relation Age of Onset  . Varicose Veins Neg Hx     Allergies  Allergen Reactions  . Lisinopril   . No Known Allergies        Assessment & Plan:   1. ESRD needing dialysis [facility]) Recommend:  The patient is doing well and currently has adequate dialysis access. The patient's dialysis center is not reporting any access issues. Flow pattern is stable when compared to the prior ultrasound.  The patient should have a duplex ultrasound of the dialysis access in 6 months. The patient will follow-up with me in the office after each ultrasound     2. Essential hypertension Continue antihypertensive medications as already ordered, these medications have been reviewed and there are no changes at this time.    Current Outpatient Medications on File Prior to Visit  Medication Sig Dispense Refill  . aspirin 81 MG chewable tablet Chew 81 mg by mouth daily.    . brimonidine-timolol (COMBIGAN) 0.2-0.5 % ophthalmic solution Place 1 drop into both eyes 2 (two) times daily.    . calcitRIOL (ROCALTROL) 0.25 MCG capsule  Take 0.25 mcg by mouth daily.    . carvedilol (COREG) 12.5 MG tablet Take 12.5 mg by mouth 2 (two) times daily with a meal.    . cholecalciferol (VITAMIN D) 25 MCG (1000 UT) tablet Take 1,000 Units by mouth daily.    . diclofenac sodium (VOLTAREN) 1 % GEL Apply 4 g topically 4 (four) times daily.    Marland Kitchen donepezil (ARICEPT) 10 MG tablet Take 10 mg by mouth at bedtime.    Marland Kitchen entecavir (BARACLUDE) 0.5 MG tablet Take 0.5 mg by mouth every 3 (three) days.    Marland Kitchen  latanoprost (XALATAN) 0.005 % ophthalmic solution Place 1 drop into both eyes at bedtime.    . lidocaine-prilocaine (EMLA) cream Apply 1 application topically as needed.    . pantoprazole (PROTONIX) 40 MG tablet Take 40 mg by mouth daily.    . simvastatin (ZOCOR) 10 MG tablet Take 10 mg by mouth at bedtime.    . torsemide (DEMADEX) 20 MG tablet Take 80 mg by mouth daily.    Marland Kitchen amLODipine (NORVASC) 5 MG tablet Take 5 mg by mouth daily.    . Melatonin 5 MG TABS Take 5 mg by mouth at bedtime.    . memantine (NAMENDA) 10 MG tablet Take 10 mg by mouth 2 (two) times daily.    Heide Spark Shell 500 MG TABS Take 1,000 mg by mouth daily.    . Skin Protectants, Misc. (MINERIN) CREA Apply 1 application topically 2 (two) times daily.    Marland Kitchen spironolactone (ALDACTONE) 25 MG tablet Take 25 mg by mouth daily.     No current facility-administered medications on file prior to visit.    There are no Patient Instructions on file for this visit. No follow-ups on file.   Kris Hartmann, NP

## 2020-05-28 ENCOUNTER — Other Ambulatory Visit (INDEPENDENT_AMBULATORY_CARE_PROVIDER_SITE_OTHER): Payer: Self-pay | Admitting: Nurse Practitioner

## 2020-05-28 DIAGNOSIS — N186 End stage renal disease: Secondary | ICD-10-CM

## 2020-05-29 ENCOUNTER — Ambulatory Visit (INDEPENDENT_AMBULATORY_CARE_PROVIDER_SITE_OTHER): Payer: Medicare Other | Admitting: Nurse Practitioner

## 2020-05-29 ENCOUNTER — Encounter (INDEPENDENT_AMBULATORY_CARE_PROVIDER_SITE_OTHER): Payer: Medicare Other

## 2020-05-29 ENCOUNTER — Encounter (INDEPENDENT_AMBULATORY_CARE_PROVIDER_SITE_OTHER): Payer: Self-pay

## 2022-11-27 ENCOUNTER — Ambulatory Visit: Payer: Medicaid Other | Admitting: Physician Assistant

## 2022-12-04 ENCOUNTER — Encounter: Payer: Medicaid Other | Attending: Physician Assistant | Admitting: Internal Medicine

## 2022-12-04 DIAGNOSIS — Z992 Dependence on renal dialysis: Secondary | ICD-10-CM | POA: Diagnosis not present

## 2022-12-04 DIAGNOSIS — I2510 Atherosclerotic heart disease of native coronary artery without angina pectoris: Secondary | ICD-10-CM | POA: Insufficient documentation

## 2022-12-04 DIAGNOSIS — Z8673 Personal history of transient ischemic attack (TIA), and cerebral infarction without residual deficits: Secondary | ICD-10-CM | POA: Insufficient documentation

## 2022-12-04 DIAGNOSIS — N186 End stage renal disease: Secondary | ICD-10-CM | POA: Diagnosis not present

## 2022-12-04 DIAGNOSIS — I120 Hypertensive chronic kidney disease with stage 5 chronic kidney disease or end stage renal disease: Secondary | ICD-10-CM | POA: Diagnosis not present

## 2022-12-04 DIAGNOSIS — L97528 Non-pressure chronic ulcer of other part of left foot with other specified severity: Secondary | ICD-10-CM | POA: Insufficient documentation

## 2022-12-04 DIAGNOSIS — C9000 Multiple myeloma not having achieved remission: Secondary | ICD-10-CM | POA: Diagnosis not present

## 2022-12-08 NOTE — Progress Notes (Signed)
CORDARRO, SPEIER ([id number]) [id number] Page 1 of 4 Visit Report for 12/04/2022 Abuse Risk Screen Details Patient Name: Date of Service: Anthony Pugh EL L. 12/04/2022 10:45 A M Medical Record Number: [id number] Patient Account Number: [id number] Date of Birth/Sex: Treating RN: 01-22-1951 (72 y.o. Arthur Holms Primary Care Gurney Balthazor: PA Zenovia Jordan, NO Other Clinician: Referring Benjermin Korber: Treating Keali Mccraw/Extender: RO BSO N, MICHA EL G PIerce, Richardson Landry in Treatment: 0 Abuse Risk Screen Items Answer ABUSE RISK SCREEN: Has anyone close to you tried to hurt or harm you recentlyo No Do you feel uncomfortable with anyone in your familyo No Has anyone forced you do things that you didnt want to doo No Electronic Signature(s) Signed: 12/08/2022 10:06:22 AM By: Elliot Gurney, BSN, RN, CWS, Kim RN, BSN Entered By: Elliot Gurney, BSN, RN, CWS, Kim on 12/04/2022 11:42:24 -------------------------------------------------------------------------------- Activities of Daily Living Details Patient Name: Date of Service: Anthony Pugh EL L. 12/04/2022 10:45 A M Medical Record Number: [id number] Patient Account Number: [id number] Date of Birth/Sex: Treating RN: 02-17-1951 (72 y.o. Arthur Holms Primary Care Caraline Deutschman: PA Zenovia Jordan, NO Other Clinician: Referring Nuchem Grattan: Treating Vicki Pasqual/Extender: RO BSO N, MICHA EL G PIerce, Richardson Landry in Treatment: 0 Activities of Daily Living Items Answer Activities of Daily Living (Please select one for each item) Drive Automobile Not Able T Medications ake Need Assistance Use T elephone Need Assistance Care for Appearance Need Assistance Use T oilet Need Assistance Bath / Shower Need Assistance Dress Self Need Assistance Feed Self Completely Able Walk Need Assistance Get In / Out Bed Need Assistance Housework Need Assistance Prepare Meals Need Assistance Handle Money Need Assistance Shop for Self Need  Assistance Electronic Signature(s) Signed: 12/08/2022 10:06:22 AM By: Elliot Gurney, BSN, RN, CWS, Kim RN, BSN Entered By: Elliot Gurney, BSN, RN, CWS, Kim on 12/04/2022 11:42:57 -------------------------------------------------------------------------------- Education Screening Details Patient Name: Date of Service: Anthony Pugh EL L. 12/04/2022 10:45 A M Medical Record Number: [id number] Patient Account Number: [id number] Date of Birth/Sex: Treating RN: [birth year] (72 y.o. Arthur Holms Primary Care Mickell Birdwell: PA Zenovia Jordan, NO Other Clinician: Referring Juliann Olesky: Treating Yanni Quiroa/Extender: Chauncey Mann, MICHA EL G PIerce, Richardson Landry in Treatment: 0 Anthony Houston, Anthony Houston ([id number]) [id number] Page 2 of 4 Learning Preferences/Education Level/Primary Language Preferred Language: English Cognitive Barrier Language Barrier: No Translator Needed: No Memory Deficit: Yes Emotional Barrier: No Cultural/Religious Beliefs Affecting Medical Care: No Physical Barrier Impaired Vision: No Impaired Hearing: No Decreased Hand dexterity: No Knowledge/Comprehension Knowledge Level: Low Comprehension Level: Low Ability to understand written instructions: Low Ability to understand verbal instructions: Low Motivation Anxiety Level: Calm Cooperation: Cooperative Education Importance: Denies Need Interest in Health Problems: Uninterested Perception: Confused Willingness to Engage in Self-Management High Activities: Readiness to Engage in Self-Management High Activities: Psychologist, prison and probation services) Signed: 12/08/2022 10:06:22 AM By: Elliot Gurney, BSN, RN, CWS, Kim RN, BSN Entered By: Elliot Gurney, BSN, RN, CWS, Kim on 12/04/2022 11:43:26 -------------------------------------------------------------------------------- Fall Risk Assessment Details Patient Name: Date of Service: Anthony Pugh EL L. 12/04/2022 10:45 A M Medical Record Number: [id number] Patient Account Number: [id number] Date  of Birth/Sex: Treating RN: Jun 17, 1951 (72 y.o. Arthur Holms Primary Care Davis Ambrosini: PA TIENT, NO Other Clinician: Referring Gumaro Brightbill: Treating Daena Alper/Extender: RO BSO N, MICHA EL G PIerce, Richardson Landry in Treatment: 0 Fall Risk Assessment Items Have you had 2 or more falls in the last 12 monthso 0 No Have you had any fall that resulted in injury in the last 12 monthso 0 No FALLS RISK SCREEN History of  falling - immediate or within 3 months 0 No Secondary diagnosis (Do you have 2 or more medical diagnoseso) 15 Yes Ambulatory aid None/bed rest/wheelchair/nurse 0 Yes Crutches/cane/walker 0 No Furniture 0 No Intravenous therapy Access/Saline/Heparin Lock 0 No Gait/Transferring Normal/ bed rest/ wheelchair 0 No Weak (short steps with or without shuffle, stooped but able to lift head while walking, may seek 10 Yes support from furniture) Impaired (short steps with shuffle, may have difficulty arising from chair, head down, impaired 0 No balance) Mental Status Oriented to own ability 0 No Electronic Signature(s) Signed: 12/08/2022 10:06:22 AM By: Elliot Gurney, BSN, RN, CWS, Kim RN, BSN [place], [place] L ([id number]) [id number] Page 3 of 4 Entered By: Elliot Gurney, BSN, RN, CWS, Kim on 12/04/2022 11:44:00 -------------------------------------------------------------------------------- Foot Assessment Details Patient Name: Date of Service: Anthony Pugh EL L. 12/04/2022 10:45 A M Medical Record Number: [id number] Patient Account Number: [id number] Date of Birth/Sex: Treating RN: [birth year] (72 y.o. Arthur Holms Primary Care Elden Brucato: PA TIENT, NO Other Clinician: Referring Sherill Wegener: Treating Anay Rathe/Extender: RO BSO N, MICHA EL G PIerce, Richardson Landry in Treatment: 0 Foot Assessment Items Site Locations + = Sensation present, - = Sensation absent, C = Callus, U = Ulcer R = Redness, W = Warmth, M = Maceration, PU = Pre-ulcerative lesion F = Fissure, S =  Swelling, D = Dryness Assessment Right: Left: Other Deformity: No No Prior Foot Ulcer: No No Prior Amputation: No No Charcot Joint: No No Ambulatory Status: Ambulatory With Help Assistance Device: Wheelchair Gait: Surveyor, mining) Signed: 12/08/2022 10:06:22 AM By: Elliot Gurney, BSN, RN, CWS, Kim RN, BSN Entered By: Elliot Gurney, BSN, RN, CWS, Kim on 12/04/2022 11:45:27 -------------------------------------------------------------------------------- Nutrition Risk Screening Details Patient Name: Date of Service: Anthony Pugh EL L. 12/04/2022 10:45 A M Medical Record Number: [id number] Patient Account Number: [id number] Date of Birth/Sex: Treating RN: 1951/05/08 (72 y.o. Arthur Holms Primary Care Kelan Pritt: PA Zenovia Jordan, NO Other Clinician: Referring Kaizen Ibsen: Treating Eyla Tallon/Extender: RO BSO N, MICHA EL G PIerce, Richardson Landry in Treatment: 0 Height (in): 72 Weight (lbs): 206 Body Mass Index (BMI): 27.9 Anthony Houston, Anthony Houston ([id number]) [phone] [id number].pdf Page 4 of 4 Nutrition Risk Screening Items Score Screening NUTRITION RISK SCREEN: I have an illness or condition that made me change the kind and/or amount of food I eat 0 No I eat fewer than two meals per day 0 No I eat few fruits and vegetables, or milk products 0 No I have three or more drinks of beer, liquor or wine almost every day 0 No I have tooth or mouth problems that make it hard for me to eat 0 No I don't always have enough money to buy the food I need 0 No I eat alone most of the time 0 No I take three or more different prescribed or over-the-counter drugs a day 0 No Without wanting to, I have lost or gained 10 pounds in the last six months 0 No I am not always physically able to shop, cook and/or feed myself 0 No Nutrition Protocols Good Risk Protocol 0 No interventions needed Moderate Risk Protocol High Risk Proctocol Risk Level: Good Risk Score: 0 Electronic  Signature(s) Signed: 12/08/2022 10:06:22 AM By: Elliot Gurney, BSN, RN, CWS, Kim RN, BSN Entered By: Elliot Gurney, BSN, RN, CWS, Kim on 12/04/2022 11:44:22

## 2022-12-10 ENCOUNTER — Other Ambulatory Visit (INDEPENDENT_AMBULATORY_CARE_PROVIDER_SITE_OTHER): Payer: Self-pay | Admitting: Internal Medicine

## 2022-12-10 DIAGNOSIS — M7989 Other specified soft tissue disorders: Secondary | ICD-10-CM

## 2022-12-10 DIAGNOSIS — S81809S Unspecified open wound, unspecified lower leg, sequela: Secondary | ICD-10-CM

## 2022-12-17 ENCOUNTER — Emergency Department: Payer: Medicare Other

## 2022-12-17 ENCOUNTER — Encounter: Payer: Self-pay | Admitting: Emergency Medicine

## 2022-12-17 ENCOUNTER — Inpatient Hospital Stay
Admission: EM | Admit: 2022-12-17 | Discharge: 2022-12-18 | DRG: 189 | Disposition: A | Discharge status: Nursing Home (Includes ICF) | Payer: Medicare Other | Source: Ambulatory Visit | Attending: Internal Medicine | Admitting: Internal Medicine

## 2022-12-17 ENCOUNTER — Other Ambulatory Visit: Payer: Self-pay

## 2022-12-17 DIAGNOSIS — N2581 Secondary hyperparathyroidism of renal origin: Secondary | ICD-10-CM | POA: Diagnosis present

## 2022-12-17 DIAGNOSIS — D631 Anemia in chronic kidney disease: Secondary | ICD-10-CM | POA: Diagnosis present

## 2022-12-17 DIAGNOSIS — I10 Essential (primary) hypertension: Secondary | ICD-10-CM | POA: Diagnosis present

## 2022-12-17 DIAGNOSIS — Z992 Dependence on renal dialysis: Secondary | ICD-10-CM

## 2022-12-17 DIAGNOSIS — F17210 Nicotine dependence, cigarettes, uncomplicated: Secondary | ICD-10-CM | POA: Diagnosis present

## 2022-12-17 DIAGNOSIS — Z85831 Personal history of malignant neoplasm of soft tissue: Secondary | ICD-10-CM | POA: Diagnosis not present

## 2022-12-17 DIAGNOSIS — B181 Chronic viral hepatitis B without delta-agent: Secondary | ICD-10-CM | POA: Insufficient documentation

## 2022-12-17 DIAGNOSIS — Z7982 Long term (current) use of aspirin: Secondary | ICD-10-CM | POA: Diagnosis not present

## 2022-12-17 DIAGNOSIS — Z716 Tobacco abuse counseling: Secondary | ICD-10-CM

## 2022-12-17 DIAGNOSIS — J811 Chronic pulmonary edema: Secondary | ICD-10-CM | POA: Diagnosis present

## 2022-12-17 DIAGNOSIS — W208XXA Other cause of strike by thrown, projected or falling object, initial encounter: Secondary | ICD-10-CM | POA: Diagnosis present

## 2022-12-17 DIAGNOSIS — Z79899 Other long term (current) drug therapy: Secondary | ICD-10-CM

## 2022-12-17 DIAGNOSIS — Z888 Allergy status to other drugs, medicaments and biological substances status: Secondary | ICD-10-CM | POA: Diagnosis not present

## 2022-12-17 DIAGNOSIS — S91302A Unspecified open wound, left foot, initial encounter: Secondary | ICD-10-CM | POA: Diagnosis present

## 2022-12-17 DIAGNOSIS — I120 Hypertensive chronic kidney disease with stage 5 chronic kidney disease or end stage renal disease: Secondary | ICD-10-CM | POA: Diagnosis present

## 2022-12-17 DIAGNOSIS — N186 End stage renal disease: Secondary | ICD-10-CM

## 2022-12-17 DIAGNOSIS — H409 Unspecified glaucoma: Secondary | ICD-10-CM | POA: Diagnosis present

## 2022-12-17 DIAGNOSIS — B182 Chronic viral hepatitis C: Secondary | ICD-10-CM

## 2022-12-17 DIAGNOSIS — J9601 Acute respiratory failure with hypoxia: Principal | ICD-10-CM | POA: Diagnosis present

## 2022-12-17 DIAGNOSIS — Z1152 Encounter for screening for COVID-19: Secondary | ICD-10-CM

## 2022-12-17 DIAGNOSIS — I2510 Atherosclerotic heart disease of native coronary artery without angina pectoris: Secondary | ICD-10-CM | POA: Diagnosis present

## 2022-12-17 DIAGNOSIS — F0390 Unspecified dementia without behavioral disturbance: Secondary | ICD-10-CM | POA: Diagnosis present

## 2022-12-17 DIAGNOSIS — E8770 Fluid overload, unspecified: Secondary | ICD-10-CM | POA: Diagnosis present

## 2022-12-17 DIAGNOSIS — S91302D Unspecified open wound, left foot, subsequent encounter: Secondary | ICD-10-CM

## 2022-12-17 DIAGNOSIS — L97922 Non-pressure chronic ulcer of unspecified part of left lower leg with fat layer exposed: Secondary | ICD-10-CM | POA: Diagnosis present

## 2022-12-17 DIAGNOSIS — C9000 Multiple myeloma not having achieved remission: Secondary | ICD-10-CM | POA: Diagnosis present

## 2022-12-17 LAB — CBC WITH DIFFERENTIAL/PLATELET
Abs Immature Granulocytes: 0.01 10*3/uL (ref 0.00–0.07)
Basophils Absolute: 0 10*3/uL (ref 0.0–0.1)
Basophils Relative: 1 %
Eosinophils Absolute: 0.2 10*3/uL (ref 0.0–0.5)
Eosinophils Relative: 4 %
HCT: 31.9 % — ABNORMAL LOW (ref 39.0–52.0)
Hemoglobin: 10.2 g/dL — ABNORMAL LOW (ref 13.0–17.0)
Immature Granulocytes: 0 %
Lymphocytes Relative: 20 %
Lymphs Abs: 0.8 10*3/uL (ref 0.7–4.0)
MCH: 27.6 pg (ref 26.0–34.0)
MCHC: 32 g/dL (ref 30.0–36.0)
MCV: 86.4 fL (ref 80.0–100.0)
Monocytes Absolute: 0.6 10*3/uL (ref 0.1–1.0)
Monocytes Relative: 14 %
Neutro Abs: 2.5 10*3/uL (ref 1.7–7.7)
Neutrophils Relative %: 61 %
Platelets: 212 10*3/uL (ref 150–400)
RBC: 3.69 MIL/uL — ABNORMAL LOW (ref 4.22–5.81)
RDW: 15.5 % (ref 11.5–15.5)
WBC: 4.1 10*3/uL (ref 4.0–10.5)
nRBC: 0 % (ref 0.0–0.2)

## 2022-12-17 LAB — COMPREHENSIVE METABOLIC PANEL
ALT: 14 U/L (ref 0–44)
AST: 24 U/L (ref 15–41)
Albumin: 3 g/dL — ABNORMAL LOW (ref 3.5–5.0)
Alkaline Phosphatase: 88 U/L (ref 38–126)
Anion gap: 13 (ref 5–15)
BUN: 23 mg/dL (ref 8–23)
CO2: 26 mmol/L (ref 22–32)
Calcium: 8 mg/dL — ABNORMAL LOW (ref 8.9–10.3)
Chloride: 100 mmol/L (ref 98–111)
Creatinine, Ser: 5.23 mg/dL — ABNORMAL HIGH (ref 0.61–1.24)
GFR, Estimated: 11 mL/min — ABNORMAL LOW (ref >60)
Glucose, Bld: 97 mg/dL (ref 70–99)
Potassium: 3.5 mmol/L (ref 3.5–5.1)
Sodium: 139 mmol/L (ref 135–145)
Total Bilirubin: 0.8 mg/dL (ref 0.3–1.2)
Total Protein: 7.3 g/dL (ref 6.5–8.1)

## 2022-12-17 LAB — LACTIC ACID, PLASMA
Lactic Acid, Venous: 0.9 mmol/L (ref 0.5–1.9)
Lactic Acid, Venous: 1.1 mmol/L (ref 0.5–1.9)

## 2022-12-17 LAB — TROPONIN I (HIGH SENSITIVITY): Troponin I (High Sensitivity): 72 ng/L — ABNORMAL HIGH (ref <18)

## 2022-12-17 LAB — SARS CORONAVIRUS 2 BY RT PCR: SARS Coronavirus 2 by RT PCR: NEGATIVE

## 2022-12-17 MED ORDER — MEMANTINE HCL 5 MG PO TABS
5.0000 mg | ORAL_TABLET | Freq: Two times a day (BID) | ORAL | Status: DC
  Administered 2022-12-17: 5 mg via ORAL
  Filled 2022-12-17: qty 1

## 2022-12-17 MED ORDER — BRIMONIDINE TARTRATE-TIMOLOL 0.2-0.5 % OP SOLN
1.0000 [drp] | Freq: Two times a day (BID) | OPHTHALMIC | Status: DC
  Filled 2022-12-17: qty 5

## 2022-12-17 MED ORDER — TORSEMIDE 20 MG PO TABS
80.0000 mg | ORAL_TABLET | Freq: Every day | ORAL | Status: DC
  Administered 2022-12-17: 80 mg via ORAL
  Filled 2022-12-17: qty 4

## 2022-12-17 MED ORDER — ACETAMINOPHEN 650 MG RE SUPP: 650.0000 mg | RECTAL | Status: DC | PRN

## 2022-12-17 MED ORDER — DONEPEZIL HCL 5 MG PO TABS
10.0000 mg | ORAL_TABLET | Freq: Every day | ORAL | Status: DC
  Administered 2022-12-17: 10 mg via ORAL
  Filled 2022-12-17: qty 2

## 2022-12-17 MED ORDER — IPRATROPIUM-ALBUTEROL 0.5-2.5 (3) MG/3ML IN SOLN: 3.0000 mL | RESPIRATORY_TRACT | Status: DC | PRN

## 2022-12-17 MED ORDER — BRIMONIDINE TARTRATE 0.2 % OP SOLN
1.0000 [drp] | Freq: Two times a day (BID) | OPHTHALMIC | Status: DC
  Administered 2022-12-17: 1 [drp] via OPHTHALMIC
  Filled 2022-12-17: qty 5

## 2022-12-17 MED ORDER — SODIUM CHLORIDE 0.9 % IV SOLN
500.0000 mg | INTRAVENOUS | Status: DC
  Filled 2022-12-17: qty 5

## 2022-12-17 MED ORDER — PREDNISONE 20 MG PO TABS: 40.0000 mg | ORAL_TABLET | Freq: Every day | ORAL | Status: DC

## 2022-12-17 MED ORDER — SODIUM CHLORIDE 0.9 % IV SOLN
500.0000 mg | INTRAVENOUS | Status: DC
  Administered 2022-12-17: 500 mg via INTRAVENOUS
  Filled 2022-12-17: qty 5

## 2022-12-17 MED ORDER — SODIUM CHLORIDE 0.9 % IV SOLN: 250.0000 mL | INTRAVENOUS | Status: DC | PRN

## 2022-12-17 MED ORDER — SODIUM CHLORIDE 0.9% FLUSH
3.0000 mL | Freq: Two times a day (BID) | INTRAVENOUS | Status: DC
  Administered 2022-12-17: 3 mL via INTRAVENOUS

## 2022-12-17 MED ORDER — TIMOLOL MALEATE 0.5 % OP SOLN
1.0000 [drp] | Freq: Two times a day (BID) | OPHTHALMIC | Status: DC
  Administered 2022-12-17: 1 [drp] via OPHTHALMIC
  Filled 2022-12-17: qty 5

## 2022-12-17 MED ORDER — ENOXAPARIN SODIUM 30 MG/0.3ML IJ SOSY
30.0000 mg | PREFILLED_SYRINGE | INTRAMUSCULAR | Status: DC
  Administered 2022-12-17: 30 mg via SUBCUTANEOUS
  Filled 2022-12-17: qty 0.3

## 2022-12-17 MED ORDER — PANTOPRAZOLE SODIUM 40 MG PO TBEC
40.0000 mg | DELAYED_RELEASE_TABLET | Freq: Every day | ORAL | Status: DC
  Administered 2022-12-17: 40 mg via ORAL
  Filled 2022-12-17: qty 1

## 2022-12-17 MED ORDER — CARVEDILOL 12.5 MG PO TABS
12.5000 mg | ORAL_TABLET | Freq: Every day | ORAL | Status: DC
  Administered 2022-12-17: 12.5 mg via ORAL
  Filled 2022-12-17: qty 1

## 2022-12-17 MED ORDER — NICOTINE 21 MG/24HR TD PT24
21.0000 mg | MEDICATED_PATCH | Freq: Every day | TRANSDERMAL | Status: DC
  Administered 2022-12-17: 21 mg via TRANSDERMAL
  Filled 2022-12-17: qty 1

## 2022-12-17 MED ORDER — SODIUM CHLORIDE 0.9 % IV SOLN
2.0000 g | INTRAVENOUS | Status: DC
  Administered 2022-12-17: 2 g via INTRAVENOUS
  Filled 2022-12-17: qty 20

## 2022-12-17 MED ORDER — NETARSUDIL DIMESYLATE 0.02 % OP SOLN: 1.0000 [drp] | Freq: Every day | OPHTHALMIC | Status: DC

## 2022-12-17 MED ORDER — SODIUM CHLORIDE 0.9% FLUSH: 3.0000 mL | INTRAVENOUS | Status: DC | PRN

## 2022-12-17 MED ORDER — SIMVASTATIN 20 MG PO TABS
10.0000 mg | ORAL_TABLET | Freq: Every day | ORAL | Status: DC
  Administered 2022-12-17: 10 mg via ORAL
  Filled 2022-12-17: qty 1

## 2022-12-17 MED ORDER — ENTECAVIR 0.5 MG PO TABS: 0.5000 mg | ORAL_TABLET | ORAL | Status: DC

## 2022-12-17 MED ORDER — SODIUM CHLORIDE 0.9% FLUSH: 10.0000 mL | INTRAVENOUS | Status: DC | PRN

## 2022-12-17 MED ORDER — FUROSEMIDE 10 MG/ML IJ SOLN
40.0000 mg | Freq: Once | INTRAMUSCULAR | Status: AC
  Administered 2022-12-17: 40 mg via INTRAVENOUS
  Filled 2022-12-17: qty 4

## 2022-12-17 MED ORDER — METHYLPREDNISOLONE SODIUM SUCC 125 MG IJ SOLR
125.0000 mg | Freq: Two times a day (BID) | INTRAMUSCULAR | Status: AC
  Administered 2022-12-17 – 2022-12-18 (×2): 125 mg via INTRAMUSCULAR
  Filled 2022-12-17 (×2): qty 2

## 2022-12-17 MED ORDER — ASPIRIN 81 MG PO CHEW
81.0000 mg | CHEWABLE_TABLET | Freq: Every day | ORAL | Status: DC
  Administered 2022-12-17: 81 mg via ORAL
  Filled 2022-12-17: qty 1

## 2022-12-17 MED ORDER — K PHOS MONO-SOD PHOS DI & MONO 155-852-130 MG PO TABS
500.0000 mg | ORAL_TABLET | Freq: Every day | ORAL | Status: DC
  Administered 2022-12-17: 500 mg via ORAL
  Filled 2022-12-17 (×2): qty 2

## 2022-12-17 MED ORDER — SODIUM CHLORIDE 0.9 % IV SOLN
2.0000 g | INTRAVENOUS | Status: DC
  Filled 2022-12-17: qty 20

## 2022-12-17 NOTE — ED Notes (Signed)
Lab unable to collect any blood from pt. IV team consult placed at this time. Pt has been stuck multiple times

## 2022-12-17 NOTE — Assessment & Plan Note (Signed)
Patient ports left foot wound after an object fell on his foot last week Patient reports progressive symptomatic improvement Will consult wound care as a preemptive measure while in the hospital Follow 

## 2022-12-17 NOTE — ED Notes (Addendum)
Placed IV team consult given pt needs IV and will need med. Pt also will need additional blood so added that to the order.  Per IV team they will not come until pt is in room and orders are placed for medications.  Will inform MD of delay in pt care.

## 2022-12-17 NOTE — ED Notes (Signed)
Pt removed PIV w/ ABX running. Pt also removed all monitoring equipment. Pt placed back on monitor. IV Team  consult ordered and VS updated.

## 2022-12-17 NOTE — Assessment & Plan Note (Signed)
Continue Entecavir

## 2022-12-17 NOTE — Progress Notes (Signed)
Samantha RN notified via secure chat to re consult IV Team once IV needs are ordered.  IV team does not guarantee labs with IV start.  Also to notify once pt in room, currently in the waiting room.

## 2022-12-17 NOTE — Assessment & Plan Note (Signed)
Continue Aricept and Namenda ?

## 2022-12-17 NOTE — Progress Notes (Addendum)
RAFA PIV started with 22G, single attempt.  Able to obtain 1ml blood for labs without losing PIV site needed for NS at [facility].  Requested RN to room to utilize blood obtained as needed.  BP cuff removed to prevent infiltrating PIV.  Kimberlee RN notified, stated "only needed labs.  I will let the dr know".

## 2022-12-17 NOTE — Assessment & Plan Note (Signed)
No active chest pain EKG normal sinus rhythm Trop pending  Cont home regimen

## 2022-12-17 NOTE — Assessment & Plan Note (Signed)
Decompensated respiratory status with O2 requirement up to 3 to 4 L at bedside Noted baseline 1 pack/day smoker as well as ESRD on hemodialysis Status post HD session today Chest x-ray with increased interstitial bilateral opacities-edema versus infiltrate Suspect likely multifactorial with contributions of COPD,?  Pneumonia,?  Volume overload IV Solu-Medrol IV Rocephin and azithromycin for infectious coverage As needed DuoNebs Panculture including urine strep Legionella, sputum culture, expanded respiratory panel Formally consult nephrology in case patient may benefit from additional hemodialysis session

## 2022-12-17 NOTE — Assessment & Plan Note (Signed)
BP stable Titrate home regimen 

## 2022-12-17 NOTE — ED Triage Notes (Signed)
Patient in recliner from ems c/o shortness of breath and concerned for pneumonia onset of this morning. Patient coming from dialysis where he had 3/4 of his treatment completed. Patient on 4L [place]. Coughing.

## 2022-12-17 NOTE — H&P (Addendum)
History and Physical    Patient: Anthony Houston ZOX:[id number] DOB: [birth year] DOA: 12/17/2022 DOS: the patient was seen and examined on 12/17/2022 PCP: Pcp, No  Patient coming from:  Hemodialysis  Chief Complaint:  Chief Complaint  Patient presents with   Shortness of Breath   HPI: Anthony Houston is a 72 y.o. male with medical history significant of coronary artery disease, end-stage renal disease on hemodialysis Monday Wednesday Friday, dementia, hypertension, glaucoma, history of GIST stage II, multiple myeloma tobacco abuse, chronic hepatitis B presenting with acute respiratory failure with hypoxia.  Mild generalized confusion in the setting of dementia.  Patient noted to have been in dialysis today with worsening shortness of breath.  Shortness of breath persisted with three quarters of a hemodialysis session.  Patient reports worsening shortness of breath over multiple days.  Positive cough wheezing and sputum production.  Positive generalized malaise.  1 pack/day smoker.  No nausea or vomiting.  No reported abdominal pain.  No reported sick contacts.  Patient also with noted left foot wound.  Patient reports object fell on his foot last week.  This area is symptomatically improving per the patient. Presented to the ER Tmax 99.6, BP stable requiring 3 to 4 L nasal to keep O2 sats greater than 95%.  White count 4.1, hemoglobin 10.2, platelets 212.  COVID-negative.  Creatinine 5.23.  Lactate 0.9.  Chest x-ray with increased bilateral interstitial opacities with concern for pulmonary edema. Review of Systems: As mentioned in the history of present illness. All other systems reviewed and are negative. 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   Glaucoma    Hypertension    Knee pain, left    Multiple myeloma (HCC)    Past  Surgical History:  Procedure Laterality Date   ABDOMINAL SURGERY  w/in 10 years   to remove cancer    DIALYSIS/PERMA CATHETER INSERTION N/A 07/26/2018   Procedure: DIALYSIS/PERMA CATHETER INSERTION;  Surgeon: Renford Dills, MD;  Location: [facility] INVASIVE CV [facility];  Service: Cardiovascular;  Laterality: N/A;   DIALYSIS/PERMA CATHETER INSERTION N/A 05/05/2019   Procedure: DIALYSIS/PERMA CATHETER EXCHANGE;  Surgeon: Renford Dills, MD;  Location: [facility] INV[facility]IVE CV [facility];  Service: Cardiovascular;  Laterality: N/A;   NO PAST SURGERIES     PERIPHERAL VASCULAR THROMBECTOMY Left 05/05/2019   Procedure: PERIPHERAL VASCULAR THROMBECTOMY;  Surgeon: Renford Dills, MD;  Location: [facility] INV[facility]IVE CV [facility];  Service: Cardiovascular;  Laterality: Left;   Social History:  reports that he has never smoked. He has never used smokeless tobacco. He reports that he does not currently use alcohol. He reports that he does not use drugs.  Allergies  Allergen Reactions   Lisinopril    No Known Allergies     Family History  Problem Relation Age of Onset   Varicose Veins Neg Hx     Prior to Admission medications   Medication Sig Start Date End Date Taking? Authorizing Provider  acetaminophen (TYLENOL) 325 MG suppository Place 650 mg rectally every 4 (four) hours as needed for fever or mild pain.   Yes [provider]  aspirin 81 MG chewable tablet Chew 81 mg by mouth daily.   Yes [provider]  brimonidine-timolol (COMBIGAN) 0.2-0.5 % ophthalmic solution Place 1 drop into both eyes 2 (two) times daily.  Yes [provider]  carvedilol (COREG) 12.5 MG tablet Take 12.5 mg by mouth at bedtime.   Yes [provider]  diclofenac sodium (VOLTAREN) 1 % GEL Apply 4 g topically 4 (four) times daily.   Yes [provider]  donepezil (ARICEPT) 10 MG tablet Take 10 mg by mouth at bedtime.   Yes [provider]  entecavir (BARACLUDE) 0.5 MG tablet Take 0.5 mg by  mouth every 3 (three) days.   Yes [provider]  loperamide (IMODIUM A-D) 2 MG tablet Take 4 mg by mouth 3 (three) times daily as needed for diarrhea or loose stools.   Yes [provider]  LUMIGAN 0.01 % SOLN Place 1 drop into both eyes at bedtime. 11/24/22  Yes [provider]  magnesium hydroxide (MILK OF MAGNESIA) 400 MG/5ML suspension Take 30 mLs by mouth daily as needed for mild constipation.   Yes [provider]  magnesium oxide (MAG-OX) 400 (240 Mg) MG tablet Take 400 mg by mouth daily.   Yes [provider]  memantine (NAMENDA) 5 MG tablet Take 5 mg by mouth 2 (two) times daily. 12/02/22  Yes [provider]  Oyster Shell 500 MG TABS Take 1,000 mg by mouth daily.   Yes [provider]  pantoprazole (PROTONIX) 40 MG tablet Take 40 mg by mouth daily.   Yes [provider]  phosphorus (K PHOS NEUTRAL) 155-852-130 MG tablet Take 500 mg by mouth daily.   Yes [provider]  RHOPRESSA 0.02 % SOLN Place 1 drop into both eyes at bedtime. 10/27/22  Yes [provider]  simvastatin (ZOCOR) 10 MG tablet Take 10 mg by mouth at bedtime.   Yes [provider]  Skin Protectants, Misc. (MINERIN) CREA Apply 1 application topically 2 (two) times daily.   Yes [provider]  torsemide (DEMADEX) 20 MG tablet Take 80 mg by mouth daily.   Yes [provider]  amLODipine (NORVASC) 5 MG tablet Take 5 mg by mouth daily. Patient not taking: Reported on 12/17/2022    [provider]  calcitRIOL (ROCALTROL) 0.25 MCG capsule Take 0.25 mcg by mouth daily. Patient not taking: Reported on 12/17/2022    [provider]  cholecalciferol (VITAMIN D) 25 MCG (1000 UT) tablet Take 1,000 Units by mouth daily. Patient not taking: Reported on 12/17/2022    [provider]  latanoprost (XALATAN) 0.005 % ophthalmic solution Place 1 drop into both eyes at bedtime. Patient not taking: Reported  on 12/17/2022    [provider]  lidocaine-prilocaine (EMLA) cream Apply 1 application topically as needed. Patient not taking: Reported on 12/17/2022    [provider]  Melatonin 5 MG TABS Take 5 mg by mouth at bedtime. Patient not taking: Reported on 12/17/2022    [provider]  memantine (NAMENDA) 10 MG tablet Take 10 mg by mouth 2 (two) times daily. Patient not taking: Reported on 12/17/2022    [provider]  spironolactone (ALDACTONE) 25 MG tablet Take 25 mg by mouth daily. Patient not taking: Reported on 12/17/2022    [provider]    Physical Exam: Vitals:   12/17/22 1020 12/17/22 1021 12/17/22 1400 12/17/22 1649  BP:  (!) 140/84 (!) 142/81 137/65  Pulse:   74 78  Resp:    16  Temp:      TempSrc:      SpO2:   100% 97%  Weight: 97.5 kg     Height: 6' (1.829 m)  Physical Exam Constitutional:      Appearance: He is normal weight.     Comments: Minimal to mild generalized confusion    HENT:     Head: Normocephalic and atraumatic.     Nose: Nose normal.  Eyes:     Pupils: Pupils are equal, round, and reactive to light.  Cardiovascular:     Rate and Rhythm: Normal rate and regular rhythm.  Pulmonary:     Effort: Pulmonary effort is normal.     Comments: Positive marked inspiratory and expiratory wheezes diffusely Abdominal:     General: Abdomen is flat. Bowel sounds are normal.  Musculoskeletal:     Comments: + L foot wound    Skin:    Comments: Positive left foot wound  Neurological:     General: No focal deficit present.  Psychiatric:        Mood and Affect: Mood normal.       Data Reviewed:  There are no new results to review at this time. DG Chest Portable 1 View CLINICAL DATA:  Shortness of breath  EXAM: PORTABLE CHEST 1 VIEW  COMPARISON:  Chest radiograph dated 07/25/2018  FINDINGS: Normal lung volumes. Increased bilateral interstitial opacities. No pleural effusion or pneumothorax. The heart size  and mediastinal contours are within normal limits. No acute osseous abnormality. Partially imaged left axillary stent graft.  IMPRESSION: Increased bilateral interstitial opacities, likely pulmonary edema.  Electronically Signed   By: Agustin Cree M.D.   On: 12/17/2022 11:34  Lab Results  Component Value Date   WBC 4.1 12/17/2022   HGB 10.2 (L) 12/17/2022   HCT 31.9 (L) 12/17/2022   MCV 86.4 12/17/2022   PLT 212 12/17/2022   Last metabolic panel Lab Results  Component Value Date   GLUCOSE 97 12/17/2022   NA 139 12/17/2022   K 3.5 12/17/2022   CL 100 12/17/2022   CO2 26 12/17/2022   BUN 23 12/17/2022   CREATININE 5.23 (H) 12/17/2022   GFRNONAA 11 (L) 12/17/2022   CALCIUM 8.0 (L) 12/17/2022   PHOS 2.6 07/30/2018   PROT 7.3 12/17/2022   ALBUMIN 3.0 (L) 12/17/2022   BILITOT 0.8 12/17/2022   ALKPHOS 88 12/17/2022   AST 24 12/17/2022   ALT 14 12/17/2022   ANIONGAP 13 12/17/2022    Assessment and Plan: * Acute respiratory failure with hypoxia (HCC) Decompensated respiratory status with O2 requirement up to 3 to 4 L at bedside Noted baseline 1 pack/day smoker as well as ESRD on hemodialysis Status post HD session today Chest x-ray with increased interstitial bilateral opacities-edema versus infiltrate Suspect likely multifactorial with contributions of COPD,?  Pneumonia,?  Volume overload IV Solu-Medrol IV Rocephin and azithromycin for infectious coverage As needed DuoNebs Panculture including urine strep Legionella, sputum culture, expanded respiratory panel Formally consult nephrology in case patient may benefit from additional hemodialysis session  ESRD needing dialysis [facility]) Baseline ESRD on hemodialysis Monday Wednesday Friday Status post hemodialysis session today Still with some?  Component of volume overload Does still make some urine Continue home Demadex Consult nephrology Follow  Open wound of left foot  Patient ports left foot wound after an object  fell on his foot last week Patient reports progressive symptomatic improvement Will consult wound care as a preemptive measure while in the hospital Follow Chronic hepatitis B (HCC) LFT stable Continue Entecavir  Dementia (HCC) Continue Aricept and Namenda  Tobacco abuse counseling 1 pack/day smoker Discussed cessation Nicotine patch  CAD (coronary artery disease) No active chest pain  EKG normal sinus rhythm Trop pending  Cont home regimen     Hypertension BP stable Titrate home regimen      Advance Care Planning:   Code Status: Full Code   Consults: Dr. Wynelle Link w/ nephrology   Family Communication: No family at the bedside   Severity of Illness: The appropriate patient status for this patient is INPATIENT. Inpatient status is judged to be reasonable and necessary in order to provide the required intensity of service to ensure the patient's safety. The patient's presenting symptoms, physical exam findings, and initial radiographic and laboratory data in the context of their chronic comorbidities is felt to place them at high risk for further clinical deterioration. Furthermore, it is not anticipated that the patient will be medically stable for discharge from the hospital within 2 midnights of admission.   * I certify that at the point of admission it is my clinical judgment that the patient will require inpatient hospital care spanning beyond 2 midnights from the point of admission due to high intensity of service, high risk for further deterioration and high frequency of surveillance required.*  Greater than 50% was spent in counseling and coordination of care with patient Total encounter time 80 minutes or more  Author: Floydene Flock, MD 12/17/2022 5:35 PM  For on call review [email or web address].

## 2022-12-17 NOTE — Assessment & Plan Note (Signed)
LFT stable Continue Entecavir

## 2022-12-17 NOTE — Assessment & Plan Note (Signed)
1 pack/day smoker Discussed cessation Nicotine patch 

## 2022-12-17 NOTE — Assessment & Plan Note (Signed)
Baseline ESRD on hemodialysis Monday Wednesday Friday Status post hemodialysis session today Still with some?  Component of volume overload Does still make some urine Continue home [facility] Consult nephrology Follow

## 2022-12-17 NOTE — ED Provider Notes (Signed)
[facility] Provider Note    Event Date/Time   First MD Initiated Contact with Patient 12/17/22 1311     (approximate)   History   Chief Complaint: Shortness of Breath   HPI  Anthony Houston is a 72 y.o. male with a history of hypertension CAD ESRD on dialysis who comes to the ED from dialysis due to shortness of breath.  Patient received dialysis today about three quarters of his treatment, but was becoming too short of breath.  He does have a history of dementia and hypertension and multiple myeloma.     Physical Exam   Triage Vital Signs: ED Triage Vitals  Enc Vitals Group     BP 12/17/22 1021 (!) 140/84     Pulse Rate 12/17/22 1019 84     Resp 12/17/22 1019 (!) 24     Temp 12/17/22 1019 99.6 F (37.6 C)     Temp Source 12/17/22 1019 Oral     SpO2 12/17/22 1017 99 %     Weight 12/17/22 1020 215 lb (97.5 kg)     Height 12/17/22 1020 6' (1.829 m)     Head Circumference --      Peak Flow --      Pain Score 12/17/22 1018 0     Pain Loc --      Pain Edu? --      Excl. in GC? --     Most recent vital signs: Vitals:   12/17/22 1019 12/17/22 1021  BP:  (!) 140/84  Pulse: 84   Resp: (!) 24   Temp: 99.6 F (37.6 C)   SpO2: 96%     General: Awake, no distress.  CV:  Good peripheral perfusion.  Regular rate and rhythm Resp:  Normal effort.  Coarse breath sounds diffusely Abd:  No distention.  Soft nontender    ED Results / Procedures / Treatments   Labs (all labs ordered are listed, but only abnormal results are displayed) Labs Reviewed  COMPREHENSIVE METABOLIC PANEL - Abnormal; Notable for the following components:      Result Value   Creatinine, Ser 5.23 (*)    Calcium 8.0 (*)    Albumin 3.0 (*)    GFR, Estimated 11 (*)    All other components within normal limits  CBC WITH DIFFERENTIAL/PLATELET - Abnormal; Notable for the following components:   RBC 3.69 (*)    Hemoglobin 10.2 (*)    HCT 31.9 (*)    All other components  within normal limits  CULTURE, BLOOD (ROUTINE X 2)  CULTURE, BLOOD (ROUTINE X 2)  SARS CORONAVIRUS 2 BY RT PCR  LACTIC ACID, PLASMA  LACTIC ACID, PLASMA  URINALYSIS, W/ REFLEX TO CULTURE (INFECTION SUSPECTED)     EKG Interpreted by me Normal sinus rhythm rate of 75.  Left axis, normal intervals.  Normal QRS ST segments and T waves.   RADIOLOGY Chest x-ray interpreted by me, shows diffuse hazy opacities concerning for edema or atypical infection.  Radiology report reviewed   PROCEDURES:  Procedures   MEDICATIONS ORDERED IN ED: Medications  sodium chloride flush (NS) 0.9 % injection 10 mL (has no administration in time range)  cefTRIAXone (ROCEPHIN) 2 g in sodium chloride 0.9 % 100 mL IVPB (0 g Intravenous Stopped 12/17/22 1532)  azithromycin (ZITHROMAX) 500 mg in sodium chloride 0.9 % 250 mL IVPB (500 mg Intravenous New Bag/Given 12/17/22 1532)  furosemide (LASIX) injection 40 mg (has no administration in time range)  IMPRESSION / MDM / ASSESSMENT AND PLAN / ED COURSE  I reviewed the triage vital signs and the nursing notes.  DDx: Pneumonia, pleural effusion, pulmonary edema, electrolyte abnormality, anemia, COVID, sepsis  Patient's presentation is most consistent with acute presentation with potential threat to life or bodily function.  Patient presents with acute hypoxic respiratory failure.  Requiring 4 L nasal cannula which is new.  Vital show borderline fever.  Mild tachypnea.  Hypoxia.  Blood culture obtained, lactate is normal.  Blood pressure is normal.  Started Rocephin as well as Lasix for diuresis.  Plan to admit.   Clinical Course as of 12/17/22 1557  Thu Dec 17, 2022  1547 DG Chest Portable 1 View [NR]    Clinical Course User Index [NR] Trinna Post, MD     FINAL CLINICAL IMPRESSION(S) / ED DIAGNOSES   Final diagnoses:  Acute respiratory failure with hypoxia (HCC)     Rx / DC Orders   ED Discharge Orders     None        Note:  This document  was prepared using Dragon voice recognition software and may include unintentional dictation errors.   Sharman Cheek, MD 12/17/22 [phone]

## 2022-12-17 NOTE — Assessment & Plan Note (Signed)
Baseline ESRD on hemodialysis Monday Wednesday Friday Status post hemodialysis session today Still with some?  Component of volume overload Does still make some urine Continue home Demadex Consult nephrology Follow 

## 2022-12-17 NOTE — Assessment & Plan Note (Signed)
Patient ports left foot wound after an object fell on his foot last week Patient reports progressive symptomatic improvement Will consult wound care as a preemptive measure while in the hospital Follow

## 2022-12-18 ENCOUNTER — Inpatient Hospital Stay: Payer: Medicare Other | Admitting: Physician Assistant

## 2022-12-18 DIAGNOSIS — J9601 Acute respiratory failure with hypoxia: Secondary | ICD-10-CM | POA: Diagnosis not present

## 2022-12-18 DIAGNOSIS — N186 End stage renal disease: Secondary | ICD-10-CM | POA: Diagnosis not present

## 2022-12-18 DIAGNOSIS — B181 Chronic viral hepatitis B without delta-agent: Secondary | ICD-10-CM | POA: Diagnosis not present

## 2022-12-18 DIAGNOSIS — S91302D Unspecified open wound, left foot, subsequent encounter: Secondary | ICD-10-CM | POA: Diagnosis not present

## 2022-12-18 LAB — CBC
HCT: 31.3 % — ABNORMAL LOW (ref 39.0–52.0)
Hemoglobin: 9.8 g/dL — ABNORMAL LOW (ref 13.0–17.0)
MCH: 27.5 pg (ref 26.0–34.0)
MCHC: 31.3 g/dL (ref 30.0–36.0)
MCV: 87.7 fL (ref 80.0–100.0)
Platelets: 206 10*3/uL (ref 150–400)
RBC: 3.57 MIL/uL — ABNORMAL LOW (ref 4.22–5.81)
RDW: 15.7 % — ABNORMAL HIGH (ref 11.5–15.5)
WBC: 3.9 10*3/uL — ABNORMAL LOW (ref 4.0–10.5)
nRBC: 0 % (ref 0.0–0.2)

## 2022-12-18 LAB — HEPATITIS B SURFACE ANTIGEN: Hepatitis B Surface Ag: NONREACTIVE

## 2022-12-18 LAB — COMPREHENSIVE METABOLIC PANEL
ALT: 15 U/L (ref 0–44)
AST: 21 U/L (ref 15–41)
Albumin: 2.8 g/dL — ABNORMAL LOW (ref 3.5–5.0)
Alkaline Phosphatase: 79 U/L (ref 38–126)
Anion gap: 11 (ref 5–15)
BUN: 41 mg/dL — ABNORMAL HIGH (ref 8–23)
CO2: 27 mmol/L (ref 22–32)
Calcium: 7.8 mg/dL — ABNORMAL LOW (ref 8.9–10.3)
Chloride: 101 mmol/L (ref 98–111)
Creatinine, Ser: 6.69 mg/dL — ABNORMAL HIGH (ref 0.61–1.24)
GFR, Estimated: 8 mL/min — ABNORMAL LOW (ref >60)
Glucose, Bld: 142 mg/dL — ABNORMAL HIGH (ref 70–99)
Potassium: 3.8 mmol/L (ref 3.5–5.1)
Sodium: 139 mmol/L (ref 135–145)
Total Bilirubin: 0.6 mg/dL (ref 0.3–1.2)
Total Protein: 6.9 g/dL (ref 6.5–8.1)

## 2022-12-18 MED ORDER — HEPARIN SODIUM (PORCINE) 5000 UNIT/ML IJ SOLN
5000.0000 [IU] | Freq: Three times a day (TID) | INTRAMUSCULAR | Status: DC
  Filled 2022-12-18: qty 1

## 2022-12-18 MED ORDER — SPIRONOLACTONE 25 MG PO TABS: 25.0000 mg | ORAL_TABLET | Freq: Every day | ORAL | 0 refills | Status: DC

## 2022-12-18 NOTE — Evaluation (Signed)
Occupational Therapy Evaluation Patient Details Name: Anthony Houston MRN: [id number] DOB: 10-07-1950 Today's Date: 12/18/2022   History of Present Illness 72 y.o. male with medical history significant of coronary artery disease, end-stage renal disease on hemodialysis Monday Wednesday Friday, dementia, hypertension, glaucoma, history of GIST stage II, multiple myeloma tobacco abuse, chronic hepatitis B presenting with acute respiratory failure with hypoxia.   Clinical Impression   Pt was seen for OT evaluation this date. Prior to hospital admission, pt reports living at [facility] and generally independent from w/c level and basic ADL. Pt notes that the facility manages medications and provides meals, transportation, and cleaning services. Pt denies falls in past 45mo. Pt presents to acute OT demonstrating impaired ADL performance and functional mobility 2/2 decreased strength, cognition (unclear as to baseline), decr safety awareness, and impaired balance (See OT problem list for additional functional deficits). Pt currently requires supv for bed mobility, intermittent supv-CGA-Min A for sitting balance, setup and supv for seated LB dressing, and CGA for stand/squat pivot to recliner.  MD cleared to wean from O2. On room air seated EOB at rest, SpO2 94-95%, after pivot transfer to recliner, SpO2 92% on room air. Care team notified. Pt would benefit from skilled OT services to address noted impairments and functional limitations (see below for any additional details) in order to maximize safety and independence while minimizing falls risk and caregiver burden.     Recommendations for follow up therapy are one component of a multi-disciplinary discharge planning process, led by the attending physician.  Recommendations may be updated based on patient status, additional functional criteria and insurance authorization.   Assistance Recommended at Discharge Frequent or constant Supervision/Assistance   Patient can return home with the following A little help with walking and/or transfers;A little help with bathing/dressing/bathroom;Assistance with cooking/housework;Assist for transportation;Help with stairs or ramp for entrance;Direct supervision/assist for medications management;Direct supervision/assist for financial management    Functional Status Assessment  Patient has had a recent decline in their functional status and demonstrates the ability to make significant improvements in function in a reasonable and predictable amount of time.  Equipment Recommendations  None recommended by OT    Recommendations for Other Services       Precautions / Restrictions Precautions Precautions: Fall Restrictions Weight Bearing Restrictions: No      Mobility Bed Mobility Overal bed mobility: Needs Assistance Bed Mobility: Supine to Sit     Supine to sit: Supervision          Transfers Overall transfer level: Needs assistance Equipment used: None Transfers: Bed to chair/wheelchair/BSC   Stand pivot transfers: Min guard                Balance Overall balance assessment: Needs assistance Sitting-balance support: No upper extremity supported, Feet supported Sitting balance-Leahy Scale: Fair Sitting balance - Comments: had 2 LOB posteriorly, unclear the reason but pt able to recover wiht cues to sit upright Postural control: Posterior lean   Standing balance-Leahy Scale: Poor                             ADL either performed or assessed with clinical judgement   ADL                                         General ADL Comments: Pt able to don shoes  with setup and supv seated EOB, indep with self feeding     Vision         Perception     Praxis      Pertinent Vitals/Pain Pain Assessment Pain Assessment: No/denies pain     Hand Dominance     Extremity/Trunk Assessment Upper Extremity Assessment Upper Extremity Assessment:  Generalized weakness (LUE fistula)   Lower Extremity Assessment Lower Extremity Assessment: Generalized weakness (L post-op shoe and bandaging)       Communication     Cognition Arousal/Alertness: Awake/alert Behavior During Therapy: WFL for tasks assessed/performed Overall Cognitive Status: No family/caregiver present to determine baseline cognitive functioning                                 General Comments: Oriented to self and DOB, roughly to situation. Follows simple commands with increased time for processing and cues     General Comments  MD cleared to wean from O2. On room air seated EOB at rest, SpO2 94-95%, after pivot transfer to recliner, SpO2 92% on room air. Care team notified.    Exercises     Shoulder Instructions      Home Living Family/patient expects to be discharged to:: Assisted living                             Home Equipment: Wheelchair - manual   Additional Comments: Pt notes living at [facility]      Prior Functioning/Environment Prior Level of Function : Needs assist             Mobility Comments: Pt reports completing pivot transfers to/from w/c by himself typically, no falls in past 25mo ADLs Comments: Pt reports typically independent with basic ADL, facility provides meals, meds, and cleaning.        OT Problem List: Decreased strength;Decreased activity tolerance;Decreased safety awareness;Impaired balance (sitting and/or standing);Decreased cognition      OT Treatment/Interventions: Self-care/ADL training;Therapeutic exercise;Therapeutic activities;Cognitive remediation/compensation;Patient/family education;DME and/or AE instruction;Balance training    OT Goals(Current goals can be found in the care plan section) Acute Rehab OT Goals Patient Stated Goal: go back to [facility] OT Goal Formulation: With patient Time For Goal Achievement: 01/01/23 Potential to Achieve Goals: Good ADL Goals Pt Will Perform Upper  Body Dressing: with modified independence Pt Will Perform Lower Body Dressing: with modified independence Pt Will Transfer to Toilet: stand pivot transfer;with modified independence Pt Will Perform Toileting - Clothing Manipulation and hygiene: with modified independence  OT Frequency: Min 1X/week    Co-evaluation              AM-PAC OT "6 Clicks" Daily Activity     Outcome Measure Help from another person eating meals?: None Help from another person taking care of personal grooming?: A Little Help from another person toileting, which includes using toliet, bedpan, or urinal?: A Little Help from another person bathing (including washing, rinsing, drying)?: A Little Help from another person to put on and taking off regular upper body clothing?: A Little Help from another person to put on and taking off regular lower body clothing?: A Little 6 Click Score: 19   End of Session Equipment Utilized During Treatment: Gait belt Nurse Communication: Mobility status;Other (comment) (O2)  Activity Tolerance: Patient tolerated treatment well Patient left: in chair;with call bell/phone within reach;with chair alarm set  OT Visit Diagnosis: Other abnormalities  of gait and mobility (R26.89);Muscle weakness (generalized) (M62.81)                Time: 5409-8119 OT Time Calculation (min): 18 min Charges:  OT General Charges $OT Visit: 1 Visit OT Evaluation $OT Eval Low Complexity: 1 Low  Arman Filter., MPH, MS, OTR/L ascom [phone] 12/18/22, 2:17 PM

## 2022-12-18 NOTE — Progress Notes (Signed)
[facility] Liaison Note  Notified by TOC/Meagan of patient/family request of [facility] Paliative services.  [facility] hospital liaison will follow patient for discharge disposition.   Please call with any questions/concerns.    Thank you for the opportunity to participate in this patient's care.   Eugenie Birks, MSW [facility] Liaison

## 2022-12-18 NOTE — Progress Notes (Signed)
Mobility Specialist - Progress Note   12/18/22 1632  Mobility  Activity Transferred from chair to bed  Level of Assistance +2 (takes two people)  Assistive Device None  Activity Response Tolerated well  $Mobility charge 1 Mobility  Mobility Specialist Start Time (ACUTE ONLY) 1600  Mobility Specialist Stop Time (ACUTE ONLY) 1620  Mobility Specialist Time Calculation (min) (ACUTE ONLY) 20 min   Pt sitting in the recliner upon entry, requesting to return to bed. Squat pivot transfer +2 utilized when transferring from the recliner to bed. Pt returned supine, turned left and right while MS completed pedi care. Pt left supine with alarm set and needs within reach.  Zetta Bills Mobility Specialist 12/18/22 4:39 PM

## 2022-12-18 NOTE — Progress Notes (Signed)
OT Cancellation Note  Patient Details Name: ZAVIEN CLUBB MRN: [id number] DOB: Mar 02, 1951   Cancelled Treatment:    Reason Eval/Treat Not Completed: Patient at procedure or test/ unavailable. Consult received, chart reviewed. Pt currently at dialysis. Will re-attempt OT evaluation at later date/time as pt is available and medically appropriate.   Arman Filter., MPH, MS, OTR/L ascom [phone] 12/18/22, 8:25 AM

## 2022-12-18 NOTE — Progress Notes (Signed)
SATURATION QUALIFICATIONS: (This note is used to comply with regulatory documentation for home oxygen)  Patient Saturations on Room Air at Rest = 93%  Patient Saturations on Room Air while Ambulating = n/a%  Patient Saturations on n/a Liters of oxygen while Ambulating = n/a%  Please briefly explain why patient needs home oxygen: unable to ambulate

## 2022-12-18 NOTE — Progress Notes (Signed)
PT Cancellation Note  Patient Details Name: DERON POOLE MRN: [id number] DOB: 03/11/1951   Cancelled Treatment:    Reason Eval/Treat Not Completed: Patient at procedure or test/unavailable.  Pt currently off floor at dialysis.  Will re-attempt PT evaluation at a later date/time.  Hendricks Limes, PT 12/18/22, 8:40 AM

## 2022-12-18 NOTE — Progress Notes (Signed)
[facility]  ROUNDING NOTE   Subjective:   Anthony Houston is a 72 year old male with past medical history of hypertension, dementia, multiple myeloma, chronic hepatitis B, glaucoma, and end-stage renal disease on hemodialysis.  Patient presents to the emergency department with shortness of breath and has been admitted for Acute respiratory failure with hypoxia (HCC) [J96.01]  Patient is known to our practice and receives outpatient dialysis treatments at [facility] on a MWF schedule, followed by Dr Leta Speller, patient seen and evaluated during dialysis   HEMODIALYSIS FLOWSHEET:  Blood Flow Rate (mL/min): 400 mL/min Arterial Pressure (mmHg): -210 mmHg Venous Pressure (mmHg): 200 mmHg TMP (mmHg): 12 mmHg Ultrafiltration Rate (mL/min): 1114 mL/min Dialysate Flow Rate (mL/min): 300 ml/min Dialysis Fluid Bolus: Normal Saline  Patient resting in bed, somnolent on dialysis initiation.  Patient more alert as treatment commenced.  Poor historian due to dementia.  Denies current shortness of breath.  Denies nausea, vomiting, or diarrhea.  No chest or abdominal pain noted.  Labs on ED arrival significant for troponin 72.  COVID-19 screening negative.  CT chest without contrast shows a lung lesion, inflammation versus neoplastic disease with recommended follow-up.  We have been consulted to manage dialysis needs during this admission.   Objective:  Vital signs in last 24 hours:  Temp:  [97.7 F (36.5 C)-100.3 F (37.9 C)] 97.7 F (36.5 C) (06/07 1323) Pulse Rate:  [59-78] 68 (06/07 1323) Resp:  [15-22] 18 (06/07 1323) BP: (84-144)/(51-81) 143/76 (06/07 1323) SpO2:  [96 %-100 %] 100 % (06/07 1323) Weight:  [95.6 kg-98.6 kg] 95.6 kg (06/07 1252)  Weight change:  Filed Weights   12/17/22 1020 12/18/22 0924 12/18/22 1252  Weight: 97.5 kg 98.6 kg 95.6 kg    Intake/Output: No intake/output data recorded.   Intake/Output this shift:  Total I/O In: -  Out: 3000  [Other:3000]  Physical Exam: General: NAD, ill appearing  Head: Normocephalic, atraumatic. Moist oral mucosal membranes  Eyes: Anicteric  Neck: Supple, trachea midline  Lungs:  Wheeze present  Heart: Regular rate and rhythm  Abdomen:  Soft, nontender  Extremities:  1+ peripheral edema.  Neurologic: Nonfocal, moving all four extremities  Skin: No lesions  Access: Lt AVG    Basic Metabolic Panel: Recent Labs  Lab 12/17/22 1517 12/18/22 0832  NA 139 139  K 3.5 3.8  CL 100 101  CO2 26 27  GLUCOSE 97 142*  BUN 23 41*  CREATININE 5.23* 6.69*  CALCIUM 8.0* 7.8*    Liver Function Tests: Recent Labs  Lab 12/17/22 1517 12/18/22 0832  AST 24 21  ALT 14 15  ALKPHOS 88 79  BILITOT 0.8 0.6  PROT 7.3 6.9  ALBUMIN 3.0* 2.8*   No results for input(s): "LIPASE", "AMYLASE" in the last 168 hours. No results for input(s): "AMMONIA" in the last 168 hours.  CBC: Recent Labs  Lab 12/17/22 1517 12/18/22 0832  WBC 4.1 3.9*  NEUTROABS 2.5  --   HGB 10.2* 9.8*  HCT 31.9* 31.3*  MCV 86.4 87.7  PLT 212 206    Cardiac Enzymes: No results for input(s): "CKTOTAL", "CKMB", "CKMBINDEX", "TROPONINI" in the last 168 hours.  BNP: Invalid input(s): "POCBNP"  CBG: No results for input(s): "GLUCAP" in the last 168 hours.  Microbiology: Results for orders placed or performed during the hospital encounter of 12/17/22  Blood Culture (routine x 2)     Status: None (Preliminary result)   Collection Time: 12/17/22  3:17 PM   Specimen: BLOOD  Result Value Ref Range Status   Specimen Description BLOOD BLOOD RIGHT ARM  Final   Special Requests   Final    BOTTLES DRAWN AEROBIC AND ANAEROBIC Blood Culture adequate volume   Culture   Final    NO GROWTH < 24 HOURS Performed at [facility], [address] [place], [place] [address]    Report Status PENDING  Incomplete  SARS Coronavirus 2 by RT PCR (hospital order, performed in [facility] hospital lab) *cepheid single result  test* Anterior Nasal Swab     Status: None   Collection Time: 12/17/22  3:36 PM   Specimen: Anterior Nasal Swab  Result Value Ref Range Status   SARS Coronavirus 2 by RT PCR NEGATIVE NEGATIVE Final    Comment: (NOTE) SARS-CoV-2 target nucleic acids are NOT DETECTED.  The SARS-CoV-2 RNA is generally detectable in upper and lower respiratory specimens during the acute phase of infection. The lowest concentration of SARS-CoV-2 viral copies this assay can detect is 250 copies / mL. A negative result does not preclude SARS-CoV-2 infection and should not be used as the sole basis for treatment or other patient management decisions.  A negative result may occur with improper specimen collection / handling, submission of specimen other than nasopharyngeal swab, presence of viral mutation(s) within the areas targeted by this assay, and inadequate number of viral copies (<250 copies / mL). A negative result must be combined with clinical observations, patient history, and epidemiological information.  Fact Sheet for Patients:   RoadLapTop.co.za  Fact Sheet for Healthcare Providers: http://kim-miller.com/  This test is not yet approved or  cleared by the [place] FDA and has been authorized for detection and/or diagnosis of SARS-CoV-2 by FDA under an Emergency Use Authorization (EUA).  This EUA will remain in effect (meaning this test can be used) for the duration of the COVID-19 declaration under Section 564(b)(1) of the Act, 21 U.S.C. section 360bbb-3(b)(1), unless the authorization is terminated or revoked sooner.  Performed at [facility], [address] [place], [place] [address]   Blood Culture (routine x 2)     Status: None (Preliminary result)   Collection Time: 12/17/22  4:07 PM   Specimen: BLOOD  Result Value Ref Range Status   Specimen Description BLOOD BLOOD RIGHT ARM  Final   Special Requests   Final    BOTTLES  DRAWN AEROBIC AND ANAEROBIC Blood Culture adequate volume   Culture   Final    NO GROWTH < 12 HOURS Performed at [facility], [address], [place], [place] [address]    Report Status PENDING  Incomplete    Coagulation Studies: No results for input(s): "LABPROT", "INR" in the last 72 hours.  Urinalysis: No results for input(s): "COLORURINE", "LABSPEC", "PHURINE", "GLUCOSEU", "HGBUR", "BILIRUBINUR", "KETONESUR", "PROTEINUR", "UROBILINOGEN", "NITRITE", "LEUKOCYTESUR" in the last 72 hours.  Invalid input(s): "APPERANCEUR"    Imaging: CT CHEST WO CONTRAST  Result Date: 12/17/2022 CLINICAL DATA:  Respiratory illness with nondiagnostic x-ray. Shortness of breath. Dialysis today. EXAM: CT CHEST WITHOUT CONTRAST TECHNIQUE: Multidetector CT imaging of the chest was performed following the standard protocol without IV contrast. RADIATION DOSE REDUCTION: This exam was performed according to the departmental dose-optimization program which includes automated exposure control, adjustment of the mA and/or kV according to patient size and/or use of iterative reconstruction technique. COMPARISON:  Chest radiograph 12/17/2022 FINDINGS: Cardiovascular: Normal heart size. No pericardial effusions. Normal caliber thoracic aorta. Calcification of the aorta and coronary arteries. Mediastinum/Nodes: No enlarged mediastinal or  axillary lymph nodes. Thyroid gland, trachea, and esophagus demonstrate no significant findings. Lungs/Pleura: Emphysematous changes in the lungs. Subpleural fibrosis in the lung bases may represent early interstitial pneumonitis. Bronchial wall thickening consistent with chronic bronchitis. Thin-walled cavitary lesion in the left lingula, series 3, image 66, measuring 1.3 x 2.1 cm diameter. Upper Abdomen: Cholelithiasis with small stones in the gallbladder. No inflammatory changes. Multiple cysts in the kidneys likely indicating polycystic kidney disease. No specific imaging follow-up  is indicated. Fatty atrophy of the pancreas. Musculoskeletal: Degenerative changes in the spine. Multiple old rib fractures. IMPRESSION: 1. 1.3 x 2.1 cm diameter cavitary lesion in the left lingula. This could represent inflammatory or neoplastic disease. Consider one of the following in 3 months for both low-risk and high-risk individuals: (a) repeat chest CT, (b) follow-up PET-CT, or (c) tissue sampling. This recommendation follows the consensus statement: Guidelines for Management of Incidental Pulmonary Nodules Detected on CT Images: From the Fleischner Society 2017; Radiology 2017; 284:228-243. 2. Diffuse emphysematous changes, chronic bronchitic changes, and subpleural fibrosis in the lungs. No focal consolidation. 3. Renal changes consistent with polycystic kidney disease. No specific imaging follow-up is indicated. 4. Cholelithiasis without evidence of acute cholecystitis. 5. Aortic atherosclerosis. Electronically Signed   By: Burman Nieves M.D.   On: 12/17/2022 18:06   DG Chest Portable 1 View  Result Date: 12/17/2022 CLINICAL DATA:  Shortness of breath EXAM: PORTABLE CHEST 1 VIEW COMPARISON:  Chest radiograph dated 07/25/2018 FINDINGS: Normal lung volumes. Increased bilateral interstitial opacities. No pleural effusion or pneumothorax. The heart size and mediastinal contours are within normal limits. No acute osseous abnormality. Partially imaged left axillary stent graft. IMPRESSION: Increased bilateral interstitial opacities, likely pulmonary edema. Electronically Signed   By: Agustin Cree M.D.   On: 12/17/2022 11:34     Medications:    sodium chloride     azithromycin Stopped (12/18/22 1200)   cefTRIAXone (ROCEPHIN)  IV Stopped (12/18/22 1200)    aspirin  81 mg Oral Daily   brimonidine  1 drop Both Eyes BID   And   timolol  1 drop Both Eyes BID   carvedilol  12.5 mg Oral QHS   donepezil  10 mg Oral QHS   entecavir  0.5 mg Oral Q3 days   heparin injection (subcutaneous)  5,000 Units  Subcutaneous Q8H   memantine  5 mg Oral BID   Netarsudil Dimesylate  1 drop Both Eyes QHS   nicotine  21 mg Transdermal Daily   pantoprazole  40 mg Oral Daily   phosphorus  500 mg Oral Daily   predniSONE  40 mg Oral Q breakfast   simvastatin  10 mg Oral QHS   sodium chloride flush  3 mL Intravenous Q12H   torsemide  80 mg Oral Daily   sodium chloride, acetaminophen, ipratropium-albuterol, sodium chloride flush, sodium chloride flush  Assessment/ Plan:  Anthony Houston is a 72 y.o.  male with past medical history of hypertension, dementia, multiple myeloma, chronic hepatitis B, glaucoma, and end-stage renal disease on hemodialysis.  Patient presents to the emergency department with shortness of breath and has been admitted for Acute respiratory failure with hypoxia (HCC) [J96.01]   Acute respiratory failure with end stage renal disease on peritoneal dialysis. Patient received 2.5hr of treatment on Thursday. Requiring 3L [place]. Chest xray shows likely pulmonary edema. Will provide dialysis treatment with UF 3L as tolerated. Next treatment scheduled for [place]  2. Anemia of chronic kidney diseas Lab Results  Component Value Date  HGB 9.8 (L) 12/18/2022    Hgb acceptable  3.Secondary Hyperparathyroidism:  Lab Results  Component Value Date   PTH 101 (H) 07/26/2018   CALCIUM 7.8 (L) 12/18/2022   PHOS 2.6 07/30/2018    Will continue to monitor bone minerals during this admission    LOS: 1   6/7/20242:04 PM

## 2022-12-18 NOTE — TOC Transition Note (Addendum)
Transition of Care [facility]) - CM/SW Discharge Note   Patient Details  Name: Anthony Houston MRN: [id number] Date of Birth: August 16, 1950  [facility] CM/SW Contact:  Liliana Cline, LCSW Phone Number: 12/18/2022, 2:41 PM   Clinical Narrative:    Patient to DC back to [facility] today. Notified Dustin with [facility]. Amalia Hailey confirms they can continue to assist with bathing, toileting, and dressing at [facility]. Amalia Hailey states patient is already active with [facility]. Called [facility] and spoke to Grenelefe, she confirms they are active for RN and can add PT and OT.  Per MD, needs outpatient Palliative as well - notified Shania with [facility].  Confirmed with MD that patient will be weaned to room air before DC.  Amalia Hailey stated patient needs EMS transport.  FL2 with DC Med list completed and printed to be sent in DC packet. Also faxed copy to [facility] at [phone]. EMS forms completed and [facility] called for transport at 2:48, patient is 3rd on the list. RN notified.    Final next level of care: Assisted Living Barriers to Discharge: Barriers Resolved   Patient Goals and CMS Choice      Discharge Placement                  Patient to be transferred to facility by: [facility]      Discharge Plan and Services Additional resources added to the After Visit Summary for                            [facility] Arranged: PT, OT, RN [facility] Agency: [facility] Date [facility] Agency Contacted: 12/18/22   Representative spoke with at [facility] Agency: Alyssa  Social Determinants of Health (SDOH) Interventions SDOH Screenings   Tobacco Use: Low Risk  (12/17/2022)     Readmission Risk Interventions     No data to display

## 2022-12-18 NOTE — Evaluation (Signed)
Physical Therapy Evaluation Patient Details Name: Anthony Houston MRN: [id number] DOB: 05-Nov-1950 Today's Date: 12/18/2022  History of Present Illness  Pt is a 72 y.o. male presenting to hospital 12/17/22 with c/o SOB (from dialysis).  Pt admitted with acute respiratory failure with hypoxia.  PMH includes htn, CAD, ESRD on dialysis, dementia, multiple myeloma, glaucoma, chronic hepatitis B, abdominal sx (to remove CA), L peripheral vascular thrombectomy 2020.  Clinical Impression  Prior to hospital admission, pt reports being modified independent with w/c level transfers (pt reports propelling self modified independently in manual w/c); lives at [facility]; has assist as needed.  Currently pt is CGA with squat pivot transfer bed to recliner.  Pt would currently benefit from skilled PT to address noted impairments and functional limitations (see below for any additional details).  Upon hospital discharge, pt would benefit from ongoing therapy (recommend 24/7 assist for functional mobility).    Recommendations for follow up therapy are one component of a multi-disciplinary discharge planning process, led by the attending physician.  Recommendations may be updated based on patient status, additional functional criteria and insurance authorization.        Assistance Recommended at Discharge Frequent or constant Supervision/Assistance  Patient can return home with the following  A little help with walking and/or transfers;A little help with bathing/dressing/bathroom;Assistance with cooking/housework;Direct supervision/assist for medications management;Assist for transportation;Help with stairs or ramp for entrance    Equipment Recommendations Wheelchair (measurements PT);Wheelchair cushion (measurements PT)  Recommendations for Other Services       Functional Status Assessment Patient has had a recent decline in their functional status and demonstrates the ability to make significant improvements in  function in a reasonable and predictable amount of time.     Precautions / Restrictions Precautions Precautions: Fall Precaution Comments: L UE fistula Restrictions Weight Bearing Restrictions: No Other Position/Activity Restrictions: L foot wound (has surgical shoe for L foot)      Mobility  Bed Mobility               General bed mobility comments: Deferred (pt sitting up on edge of bed with OT present upon PT arrival)    Transfers Overall transfer level: Needs assistance Equipment used: None Transfers: Bed to chair/wheelchair/BSC       Squat pivot transfers: Min guard     General transfer comment: squat pivot bed to recliner    Ambulation/Gait               General Gait Details: Deferred (pt reports being non-ambulatory baseline)  Stairs            Wheelchair Mobility    Modified Rankin (Stroke Patients Only)       Balance Overall balance assessment: Needs assistance Sitting-balance support: No upper extremity supported, Feet supported Sitting balance-Leahy Scale: Fair Sitting balance - Comments: steady sitting reaching within BOS                                     Pertinent Vitals/Pain Pain Assessment Pain Assessment: No/denies pain Vitals (HR and O2 on room air) stable and WFL throughout treatment session.    Home Living Family/patient expects to be discharged to:: Assisted living                 Home Equipment: Wheelchair - manual Additional Comments: Pt reports living at [facility].    Prior Function Prior Level of Function : Needs assist  Mobility Comments: Pt reports being modified independent with w/c level transfers (pivot transfers to/from w/c); no falls reported in past 6 months. ADLs Comments: Per OT "Pt reports typically independent with basic ADL, facility provides meals, meds, and cleaning."     Hand Dominance        Extremity/Trunk Assessment   Upper Extremity  Assessment Upper Extremity Assessment: Defer to OT evaluation;Generalized weakness    Lower Extremity Assessment Lower Extremity Assessment: Generalized weakness       Communication   Communication: No difficulties  Cognition Arousal/Alertness: Awake/alert Behavior During Therapy: WFL for tasks assessed/performed Overall Cognitive Status: No family/caregiver present to determine baseline cognitive functioning                                 General Comments: Oriented to name, DOB, and general situation.  Increased time to follow simple cues.        General Comments General comments (skin integrity, edema, etc.): MD cleared to wean from O2. On room air seated EOB at rest, SpO2 94-95%, after pivot transfer to recliner, SpO2 92% on room air. Care team notified.      Exercises     Assessment/Plan    PT Assessment Patient needs continued PT services  PT Problem List Decreased strength;Decreased activity tolerance;Decreased balance;Decreased mobility       PT Treatment Interventions DME instruction;Functional mobility training;Therapeutic activities;Therapeutic exercise;Balance training;Patient/family education    PT Goals (Current goals can be found in the Care Plan section)  Acute Rehab PT Goals Patient Stated Goal: to improve overall strength PT Goal Formulation: With patient Time For Goal Achievement: 01/01/23 Potential to Achieve Goals: Good    Frequency Min 2X/week     Co-evaluation               AM-PAC PT "6 Clicks" Mobility  Outcome Measure Help needed turning from your back to your side while in a flat bed without using bedrails?: A Little Help needed moving from lying on your back to sitting on the side of a flat bed without using bedrails?: A Little Help needed moving to and from a bed to a chair (including a wheelchair)?: A Little Help needed standing up from a chair using your arms (e.g., wheelchair or bedside chair)?: A Little Help  needed to walk in hospital room?: Total Help needed climbing 3-5 steps with a railing? : Total 6 Click Score: 14    End of Session Equipment Utilized During Treatment: Gait belt Activity Tolerance: Patient tolerated treatment well Patient left: in chair;with call bell/phone within reach;with chair alarm set Nurse Communication: Mobility status;Precautions;Other (comment) (Pt's O2 sats (pt kept on room air)) PT Visit Diagnosis: Other abnormalities of gait and mobility (R26.89);Muscle weakness (generalized) (M62.81)    Time: 1610-9604 PT Time Calculation (min) (ACUTE ONLY): 18 min   Charges:   PT Evaluation $PT Eval Low Complexity: 1 Low         Delainey Winstanley, PT 12/18/22, 3:16 PM

## 2022-12-18 NOTE — Consult Note (Signed)
WOC Nurse Consult Note: Reason for Consult:left foot wounds Patient in HD, not able to provide any history.  Wound type: full thickness ulceration LLE; followed by [facility], however looks like initial consultation 5/24 and was to be seen today 6/7. I have made them aware that patient has been admitted ABI scheduled for 12/21/22.  Pressure Injury POA: NA Measurement: Left medial malleolar: 2cm x 3.5cm x 0.1cm; dry, 25% yellow/75% pale, non healing Left medial dorsal foot: 3.0cm x 6.0cm x 0.1cm ; dry, pale/ yellow/pink Wound bed:see above  Drainage (amount, consistency, odor) scant, serosanguinous  Periwound: intact, insensate  Dressing procedure/placement/frequency: Cleanse both wounds with saline, pat dry Cover with single layer of xeroform gauze, top with silicone foam or use kerlix.   Change M/W/F.   Follow up with [facility] as scheduled, will need new appointment at DC   Re consult if needed, will not follow at this time. Thanks  Keywon Mestre [employer], RN,CWOCN, CNS, CWON-AP [phone])

## 2022-12-18 NOTE — Progress Notes (Signed)
Hemodialysis Note  Received patient in bed to unit. Alert and oriented. Informed consent signed and in chart.   Treatment initiated:0816 Treatment completed:1233  Patient tolerated treatment well. Transported back to the room alert, without acute distress. Report given to patient's RN.  Access used:LUA AVF  Access issues:None   Total UF removed:3000 ml/ 3 liters  Medications given: None  Post HD HQ:IONGEX Stable  Post HD weight: 95.3 kg  bed weight   Bartolo Darter, RN  [facility] [phone]

## 2022-12-18 NOTE — Discharge Summary (Addendum)
Physician Discharge Summary   Patient: Anthony Houston MRN: [id number] DOB: 28-Nov-1950  Admit date:     12/17/2022  Discharge date: 12/18/22  Discharge Physician: Delfino Lovett   PCP: Pcp, No   Recommendations at discharge:   Follow-up with outpatient providers as requested Follow-up with [facility] wound care clinic early next week for left lower extremity wound  Discharge Diagnoses: Principal Problem:   Acute respiratory failure with hypoxia (HCC) Active Problems:   ESRD needing dialysis (HCC)   Hypertension   CAD (coronary artery disease)   Tobacco abuse counseling   Dementia (HCC)   Chronic hepatitis B (HCC)   Open wound of left foot   hospital Course: Assessment and Plan: * Acute respiratory failure with hypoxia (HCC) Decompensated respiratory status with O2 requirement up to 3 to 4 L at bedside Noted baseline 1 pack/day smoker as well as ESRD on hemodialysis Status post HD session and feeling much better and close to baseline Chest x-ray with increased interstitial bilateral opacities-edema versus infiltrate Likely volume overload which improved with dialysis  ESRD needing dialysis [facility]) Baseline ESRD on hemodialysis Monday Wednesday Friday Status post hemodialysis session   Open wound of left foot Patient ports left foot wound after an object fell on his foot last week Patient reports progressive symptomatic improvement Outpatient wound care clinic follow-up  Chronic hepatitis B (HCC) LFT stable Continue Entecavir  Dementia (HCC) Continue Aricept and Namenda  Tobacco abuse counseling 1 pack/day smoker Discussed cessation Nicotine patch  CAD (coronary artery disease) Hypertension           Consultants: Nephrology Procedures performed: Hemodialysis Disposition: Home health Diet recommendation:  Discharge Diet Orders (From admission, onward)     Start     Ordered   12/18/22 0000  Diet - low sodium heart healthy        12/18/22 1328            Renal diet DISCHARGE MEDICATION: Allergies as of 12/18/2022       Reactions   Lisinopril    No Known Allergies         Medication List     STOP taking these medications    amLODipine 5 MG tablet Commonly known as: NORVASC   calcitRIOL 0.25 MCG capsule Commonly known as: ROCALTROL   cholecalciferol 25 MCG (1000 UNIT) tablet Commonly known as: VITAMIN D3   latanoprost 0.005 % ophthalmic solution Commonly known as: XALATAN   lidocaine-prilocaine cream Commonly known as: EMLA   magnesium hydroxide 400 MG/5ML suspension Commonly known as: MILK OF MAGNESIA   melatonin 5 MG Tabs   phosphorus 155-852-130 MG tablet Commonly known as: K PHOS NEUTRAL   spironolactone 25 MG tablet Commonly known as: ALDACTONE       TAKE these medications    acetaminophen 325 MG suppository Commonly known as: TYLENOL Place 650 mg rectally every 4 (four) hours as needed for fever or mild pain.   aspirin 81 MG chewable tablet Chew 81 mg by mouth daily.   carvedilol 12.5 MG tablet Commonly known as: COREG Take 12.5 mg by mouth at bedtime.   Combigan 0.2-0.5 % ophthalmic solution Generic drug: brimonidine-timolol Place 1 drop into both eyes 2 (two) times daily.   diclofenac sodium 1 % Gel Commonly known as: VOLTAREN Apply 4 g topically 4 (four) times daily.   donepezil 10 MG tablet Commonly known as: ARICEPT Take 10 mg by mouth at bedtime.   entecavir 0.5 MG tablet Commonly known as: BARACLUDE Take 0.5 mg by  mouth every 3 (three) days.   loperamide 2 MG tablet Commonly known as: IMODIUM A-D Take 4 mg by mouth 3 (three) times daily as needed for diarrhea or loose stools.   Lumigan 0.01 % Soln Generic drug: bimatoprost Place 1 drop into both eyes at bedtime.   magnesium oxide 400 (240 Mg) MG tablet Commonly known as: MAG-OX Take 400 mg by mouth daily.   memantine 5 MG tablet Commonly known as: NAMENDA Take 5 mg by mouth 2 (two) times daily. What changed: Another  medication with the same name was removed. Continue taking this medication, and follow the directions you see here.   Minerin Creme Crea Apply 1 application topically 2 (two) times daily.   Oyster Shell 500 MG Tabs Take 1,000 mg by mouth daily.   pantoprazole 40 MG tablet Commonly known as: PROTONIX Take 40 mg by mouth daily.   Rhopressa 0.02 % Soln Generic drug: Netarsudil Dimesylate Place 1 drop into both eyes at bedtime.   simvastatin 10 MG tablet Commonly known as: ZOCOR Take 10 mg by mouth at bedtime.   torsemide 20 MG tablet Commonly known as: DEMADEX Take 80 mg by mouth daily.               Discharge Care Instructions  (From admission, onward)           Start     Ordered   12/18/22 0000  Discharge wound care:       Comments: As above   12/18/22 1328            Follow-up Information     Allen Derry Eday III, PA-C. Schedule an appointment as soon as possible for a visit in 3 day(s).   Specialty: Physician Assistant Why: [facility] Discharge F/UP Contact information: [address] [place] [address] [phone]         Lamont Dowdy, MD. Schedule an appointment as soon as possible for a visit in 2 week(s).   Specialty: Nephrology Why: [facility] Discharge F/UP Contact information: [address] [place] [address] [phone]         [facility]. Schedule an appointment as soon as possible for a visit in 1 week(s).   Why: [facility] Discharge F/UP Contact information: [address] [place] [phone]               Discharge Exam: Ceasar Mons Weights   12/17/22 1020 12/18/22 0924 12/18/22 1252  Weight: 97.5 kg 98.6 kg 61.42 kg   72 year old male lying in the bed comfortably without any acute distress Lungs clear to also bilaterally Heart regular rate and rhythm Abdomen soft, benign Neuro alert and awake, nonfocal Skin/extremities.   Full-thickness ulceration of the left lower extremity present on admission with serosanguineous drainage.  No signs of infection  Condition at discharge: fair  The results of significant diagnostics from this hospitalization (including imaging, microbiology, ancillary and laboratory) are listed below for reference.   Imaging Studies: CT CHEST WO CONTRAST  Result Date: 12/17/2022 CLINICAL DATA:  Respiratory illness with nondiagnostic x-ray. Shortness of breath. Dialysis today. EXAM: CT CHEST WITHOUT CONTRAST TECHNIQUE: Multidetector CT imaging of the chest was performed following the standard protocol without IV contrast. RADIATION DOSE REDUCTION: This exam was performed according to the departmental dose-optimization program which includes automated exposure control, adjustment of the mA and/or kV according to patient size and/or use of iterative reconstruction technique. COMPARISON:  Chest radiograph 12/17/2022 FINDINGS: Cardiovascular:  Normal heart size. No pericardial effusions. Normal caliber thoracic aorta. Calcification of the aorta and coronary arteries. Mediastinum/Nodes: No enlarged mediastinal or axillary lymph nodes. Thyroid gland, trachea, and esophagus demonstrate no significant findings. Lungs/Pleura: Emphysematous changes in the lungs. Subpleural fibrosis in the lung bases may represent early interstitial pneumonitis. Bronchial wall thickening consistent with chronic bronchitis. Thin-walled cavitary lesion in the left lingula, series 3, image 66, measuring 1.3 x 2.1 cm diameter. Upper Abdomen: Cholelithiasis with small stones in the gallbladder. No inflammatory changes. Multiple cysts in the kidneys likely indicating polycystic kidney disease. No specific imaging follow-up is indicated. Fatty atrophy of the pancreas. Musculoskeletal: Degenerative changes in the spine. Multiple old rib fractures. IMPRESSION: 1. 1.3 x 2.1 cm diameter cavitary lesion in the left lingula. This could represent  inflammatory or neoplastic disease. Consider one of the following in 3 months for both low-risk and high-risk individuals: (a) repeat chest CT, (b) follow-up PET-CT, or (c) tissue sampling. This recommendation follows the consensus statement: Guidelines for Management of Incidental Pulmonary Nodules Detected on CT Images: From the Fleischner Society 2017; Radiology 2017; 284:228-243. 2. Diffuse emphysematous changes, chronic bronchitic changes, and subpleural fibrosis in the lungs. No focal consolidation. 3. Renal changes consistent with polycystic kidney disease. No specific imaging follow-up is indicated. 4. Cholelithiasis without evidence of acute cholecystitis. 5. Aortic atherosclerosis. Electronically Signed   By: Burman Nieves M.D.   On: 12/17/2022 18:06   DG Chest Portable 1 View  Result Date: 12/17/2022 CLINICAL DATA:  Shortness of breath EXAM: PORTABLE CHEST 1 VIEW COMPARISON:  Chest radiograph dated 07/25/2018 FINDINGS: Normal lung volumes. Increased bilateral interstitial opacities. No pleural effusion or pneumothorax. The heart size and mediastinal contours are within normal limits. No acute osseous abnormality. Partially imaged left axillary stent graft. IMPRESSION: Increased bilateral interstitial opacities, likely pulmonary edema. Electronically Signed   By: Agustin Cree M.D.   On: 12/17/2022 11:34    Microbiology: Results for orders placed or performed during the hospital encounter of 12/17/22  Blood Culture (routine x 2)     Status: None (Preliminary result)   Collection Time: 12/17/22  3:17 PM   Specimen: BLOOD  Result Value Ref Range Status   Specimen Description BLOOD BLOOD RIGHT ARM  Final   Special Requests   Final    BOTTLES DRAWN AEROBIC AND ANAEROBIC Blood Culture adequate volume   Culture   Final    NO GROWTH < 24 HOURS Performed at [facility], [address] [place], [place] [address]    Report Status PENDING  Incomplete  SARS Coronavirus 2 by RT PCR  (hospital order, performed in [facility] hospital lab) *cepheid single result test* Anterior Nasal Swab     Status: None   Collection Time: 12/17/22  3:36 PM   Specimen: Anterior Nasal Swab  Result Value Ref Range Status   SARS Coronavirus 2 by RT PCR NEGATIVE NEGATIVE Final    Comment: (NOTE) SARS-CoV-2 target nucleic acids are NOT DETECTED.  The SARS-CoV-2 RNA is generally detectable in upper and lower respiratory specimens during the acute phase of infection. The lowest concentration of SARS-CoV-2 viral copies this assay can detect is 250 copies / mL. A negative result does not preclude SARS-CoV-2 infection and should not be used as the sole basis for treatment or other patient management decisions.  A negative result may occur with improper specimen collection / handling, submission of specimen other than nasopharyngeal swab, presence of viral mutation(s) within the areas targeted by this assay, and inadequate  number of viral copies (<250 copies / mL). A negative result must be combined with clinical observations, patient history, and epidemiological information.  Fact Sheet for Patients:   RoadLapTop.co.za  Fact Sheet for Healthcare Providers: http://kim-miller.com/  This test is not yet approved or  cleared by the [place] FDA and has been authorized for detection and/or diagnosis of SARS-CoV-2 by FDA under an Emergency Use Authorization (EUA).  This EUA will remain in effect (meaning this test can be used) for the duration of the COVID-19 declaration under Section 564(b)(1) of the Act, 21 U.S.C. section 360bbb-3(b)(1), unless the authorization is terminated or revoked sooner.  Performed at [facility], [address] [place], [place] [address]   Blood Culture (routine x 2)     Status: None (Preliminary result)   Collection Time: 12/17/22  4:07 PM   Specimen: BLOOD  Result Value Ref Range Status   Specimen  Description BLOOD BLOOD RIGHT ARM  Final   Special Requests   Final    BOTTLES DRAWN AEROBIC AND ANAEROBIC Blood Culture adequate volume   Culture   Final    NO GROWTH < 12 HOURS Performed at [facility], [address] [place], [place] [address]    Report Status PENDING  Incomplete    Labs: CBC: Recent Labs  Lab 12/17/22 1517 12/18/22 0832  WBC 4.1 3.9*  NEUTROABS 2.5  --   HGB 10.2* 9.8*  HCT 31.9* 31.3*  MCV 86.4 87.7  PLT 212 206   Basic Metabolic Panel: Recent Labs  Lab 12/17/22 1517 12/18/22 0832  NA 139 139  K 3.5 3.8  CL 100 101  CO2 26 27  GLUCOSE 97 142*  BUN 23 41*  CREATININE 5.23* 6.69*  CALCIUM 8.0* 7.8*   Liver Function Tests: Recent Labs  Lab 12/17/22 1517 12/18/22 0832  AST 24 21  ALT 14 15  ALKPHOS 88 79  BILITOT 0.8 0.6  PROT 7.3 6.9  ALBUMIN 3.0* 2.8*   CBG: No results for input(s): "GLUCAP" in the last 168 hours.  Discharge time spent: greater than 30 minutes.  Signed: Delfino Lovett, MD [facility] 12/18/2022

## 2022-12-18 NOTE — NC FL2 (Addendum)
[place] MEDICAID FL2 LEVEL OF CARE FORM     IDENTIFICATION  Patient Name: Anthony Houston Birthdate: May 19, 1951 Sex: male Admission Date (Current Location): 12/17/2022  [facility] and [place] Number:  Chiropodist and Address:  [facility], [address], [place], [place] [address]      Provider Number: [id number]  Attending Physician Name and Address:  Delfino Lovett, MD  Relative Name and Phone Number:  Gordy Councilman [phone]    Current Level of Care: Hospital Recommended Level of Care: Assisted Living Facility Prior Approval Number:    Date Approved/Denied:   PASRR Number:    Discharge Plan:      Current Diagnoses: Patient Active Problem List   Diagnosis Date Noted   Acute respiratory failure with hypoxia (HCC) 12/17/2022   Tobacco abuse counseling 12/17/2022   Dementia (HCC) 12/17/2022   Chronic hepatitis B (HCC) 12/17/2022   Open wound of left foot 12/17/2022   Hypertension 07/26/2018   CAD (coronary artery disease) 07/26/2018   Weakness 07/26/2018   ESRD needing dialysis (HCC) 07/26/2018    Orientation RESPIRATION BLADDER Height & Weight     Self   Incontinent Weight: 210 lb 12.2 oz (95.6 kg) Height:  6' (182.9 cm)  BEHAVIORAL SYMPTOMS/MOOD NEUROLOGICAL BOWEL NUTRITION STATUS      Incontinent Diet (Diet renal with fluid restriction Fluid restriction: 1200 mL Fluid; Room service appropriate? Yes; Fluid consistency: Thin (Order [id number]))  AMBULATORY STATUS COMMUNICATION OF NEEDS Skin   Supervision Verbally Other (Comment) (anterior left foot, anterior left ankle)                       Personal Care Assistance Level of Assistance  Bathing, Feeding, Dressing Bathing Assistance: Limited assistance Feeding assistance: Limited assistance Dressing Assistance: Limited assistance     Functional Limitations Info             SPECIAL CARE FACTORS FREQUENCY  PT (By licensed PT), OT (By licensed OT)     PT  Frequency: home health OT Frequency: home health            Contractures      Additional Factors Info  Code Status, Allergies Code Status Info: full Allergies Info: Lisinopril            Medication List       STOP taking these medications     amLODipine 5 MG tablet Commonly known as: NORVASC    calcitRIOL 0.25 MCG capsule Commonly known as: ROCALTROL    cholecalciferol 25 MCG (1000 UNIT) tablet Commonly known as: VITAMIN D3    latanoprost 0.005 % ophthalmic solution Commonly known as: XALATAN    lidocaine-prilocaine cream Commonly known as: EMLA    magnesium hydroxide 400 MG/5ML suspension Commonly known as: MILK OF MAGNESIA    melatonin 5 MG Tabs    phosphorus 155-852-130 MG tablet Commonly known as: K PHOS NEUTRAL    spironolactone 25 MG tablet Commonly known as: ALDACTONE           TAKE these medications     acetaminophen 325 MG suppository Commonly known as: TYLENOL Place 650 mg rectally every 4 (four) hours as needed for fever or mild pain.    aspirin 81 MG chewable tablet Chew 81 mg by mouth daily.    carvedilol 12.5 MG tablet Commonly known as: COREG Take 12.5 mg by mouth at bedtime.    Combigan 0.2-0.5 % ophthalmic solution Generic drug: brimonidine-timolol Place 1 drop into both  eyes 2 (two) times daily.    diclofenac sodium 1 % Gel Commonly known as: VOLTAREN Apply 4 g topically 4 (four) times daily.    donepezil 10 MG tablet Commonly known as: ARICEPT Take 10 mg by mouth at bedtime.    entecavir 0.5 MG tablet Commonly known as: BARACLUDE Take 0.5 mg by mouth every 3 (three) days.    loperamide 2 MG tablet Commonly known as: IMODIUM A-D Take 4 mg by mouth 3 (three) times daily as needed for diarrhea or loose stools.    Lumigan 0.01 % Soln Generic drug: bimatoprost Place 1 drop into both eyes at bedtime.    magnesium oxide 400 (240 Mg) MG tablet Commonly known as: MAG-OX Take 400 mg by mouth daily.    memantine 5  MG tablet Commonly known as: NAMENDA Take 5 mg by mouth 2 (two) times daily. What changed: Another medication with the same name was removed. Continue taking this medication, and follow the directions you see here.    Minerin Creme Crea Apply 1 application topically 2 (two) times daily.    Oyster Shell 500 MG Tabs Take 1,000 mg by mouth daily.    pantoprazole 40 MG tablet Commonly known as: PROTONIX Take 40 mg by mouth daily.    Rhopressa 0.02 % Soln Generic drug: Netarsudil Dimesylate Place 1 drop into both eyes at bedtime.    simvastatin 10 MG tablet Commonly known as: ZOCOR Take 10 mg by mouth at bedtime.    torsemide 20 MG tablet Commonly known as: DEMADEX Take 80 mg by mouth daily.      Relevant Imaging Results:  Relevant Lab Results:   Additional Information [facility]. Outpatient HD.  Vinita Prentiss E Paeton Studer, LCSW

## 2022-12-19 LAB — HEPATITIS B SURFACE ANTIBODY, QUANTITATIVE: Hep B S AB Quant (Post): <3.5 m[IU]/mL — ABNORMAL LOW (ref >9.9)

## 2022-12-21 ENCOUNTER — Encounter (INDEPENDENT_AMBULATORY_CARE_PROVIDER_SITE_OTHER): Payer: Medicare Other

## 2022-12-22 ENCOUNTER — Encounter: Payer: Self-pay | Admitting: Nurse Practitioner

## 2022-12-22 ENCOUNTER — Non-Acute Institutional Stay: Payer: Self-pay | Admitting: Nurse Practitioner

## 2022-12-22 DIAGNOSIS — Z515 Encounter for palliative care: Secondary | ICD-10-CM

## 2022-12-22 DIAGNOSIS — N186 End stage renal disease: Secondary | ICD-10-CM

## 2022-12-22 DIAGNOSIS — R059 Cough, unspecified: Secondary | ICD-10-CM

## 2022-12-22 DIAGNOSIS — R0602 Shortness of breath: Secondary | ICD-10-CM

## 2022-12-22 DIAGNOSIS — R5381 Other malaise: Secondary | ICD-10-CM

## 2022-12-22 LAB — CULTURE, BLOOD (ROUTINE X 2)
Culture: NO GROWTH
Culture: NO GROWTH
Special Requests: ADEQUATE
Special Requests: ADEQUATE

## 2022-12-22 NOTE — Progress Notes (Addendum)
Therapist, nutritional Palliative Care Consult Note Telephone: [phone]  Fax: [phone]   Date of encounter: 12/22/22 4:30 PM PATIENT NAME: Anthony Houston [address] [place] [address]   [phone] (home)  DOB: February 05, 1951 MRN: [id number] PRIMARY CARE PROVIDER:    [facility]  RESPONSIBLE PARTY:    Contact Information     Name Relation Home Work Mobile   Phillips,Moris Brother   [phone]      I met face to face with patient in facility. Palliative Care was asked to follow this patient by consultation request of  [facility] to address advance care planning and complex medical decision making. This is the initial visit.                                   ASSESSMENT AND PLAN / RECOMMENDATIONS:  Symptom Management/Plan: 1. Advance Care Planning;  ongoing discussions 2. Goals of Care: Goals include to maximize quality of life and symptom management. Our advance care planning conversation included a discussion about:    The value and importance of advance care planning  Exploration of personal, cultural or spiritual beliefs that might influence medical decisions  Exploration of goals of care in the event of a sudden injury or illness  Identification and preparation of a healthcare agent  Review and updating or creation of an advance directive document. 3. Palliative care encounter; Palliative care encounter; Palliative medicine team will continue to support patient, patient's family, and medical team. Visit consisted of counseling and education dealing with the complex and emotionally intense issues of symptom management and palliative care in the setting of serious and potentially life-threatening illness  4. Shortness of breath/cough secondary to ESRD/debility; continue to monitor respiratory status, O2, inhalation therapy, discussed with staff with cough improving requested to have primary f/u when they visit facility making rounds.  Appears to be improving though with co-morbidities, recent hospitalization high risk for re-hospitalization.  Follow up Palliative Care Visit: Palliative care will continue to follow for complex medical decision making, advance care planning, and clarification of goals. Return 2 to 8 weeks or prn.  I spent 62 minutes providing this consultation. More than 50% of the time in this consultation was spent in counseling and care coordination. PPS: 50%  Chief Complaint: Follow up palliative consult for complex medical decision making.  HISTORY OF PRESENT ILLNESS:  Anthony Houston is a 72 y.o. year old male  with multiple medical problems including ESRD on HD, CAD, HTN, dementia, chronic hepatitis B. Hospitalized 12/17/2022 to 12/18/2022 for acute respiratory failure with hypoxia in the setting ESRD on HD, 1 pack/day smoker, Chest x-ray with increased interstitial bilateral opacities-edema versus infiltrate likely volume overload improved with HD. Open wound left foot, chronic hepatitis B continue entecavir with stable LFT. Dementia on aricept with namenda. Stabilized and d/c back to [facility] where he resides. Anthony Houston is w/c dependent, requires assistance with ADL's, feeds self with good appetite per staff. At present Anthony Houston is sitting in the w/c in his room, just returned from HD. Anthony Klym and I talked about social, family, past medical history, current cough which is improving. We talked about HD today, tolerated without difficulty. We talked about ros including cough, sob, pain. We talked about appetite, foods he likes. We talked about daily routine, how he feels HD, quality of life. We talked residing at facility, coping strategies.  We talked about role pc in poc. Medications, poc, goc reviewed. I updated staff will continue current. PC f/u visit further discussion monitor trends of appetite, weights, monitor for functional, cognitive decline with chronic disease progression, assess any active  symptoms, supportive role.  History obtained from review of EMR, discussion with primary team, and interview with family, facility staff/caregiver and/or Anthony. Diers.  I reviewed available labs, medications, imaging, studies and related documents from the EMR.  Records reviewed and summarized above.  Physical Exam: General: obesity, pleasant w/c dependent male ENMT: oral mucous membranes moist CV: S1S2, RRR, +BLE edema Pulmonary: Breath sounds with scattered rhonchi, decreased bases MSK: w/c dependent Skin: warm and dry Neuro:  no generalized weakness,  no cognitive impairment Psych: non-anxious affect, A and O x 3  CURRENT PROBLEM LIST:  Patient Active Problem List   Diagnosis Date Noted   Acute respiratory failure with hypoxia (HCC) 12/17/2022   Tobacco abuse counseling 12/17/2022   Dementia (HCC) 12/17/2022   Chronic hepatitis B (HCC) 12/17/2022   Open wound of left foot 12/17/2022   Hypertension 07/26/2018   CAD (coronary artery disease) 07/26/2018   Weakness 07/26/2018   ESRD needing dialysis (HCC) 07/26/2018   PAST MEDICAL HISTORY:  Active Ambulatory Problems    Diagnosis Date Noted   Hypertension 07/26/2018   CAD (coronary artery disease) 07/26/2018   Weakness 07/26/2018   ESRD needing dialysis (HCC) 07/26/2018   Acute respiratory failure with hypoxia (HCC) 12/17/2022   Tobacco abuse counseling 12/17/2022   Dementia (HCC) 12/17/2022   Chronic hepatitis B (HCC) 12/17/2022   Open wound of left foot 12/17/2022   Resolved Ambulatory Problems    Diagnosis Date Noted   Chronic hepatitis C (HCC) 12/17/2022   Past Medical History:  Diagnosis Date   CKD (chronic kidney disease)    Cognitive disorder    Gastrointestinal stromal tumor (GIST) (HCC)    Glaucoma    Knee pain, left    Multiple myeloma (HCC)    SOCIAL HX:  Social History   Tobacco Use   Smoking status: Never   Smokeless tobacco: Never  Substance Use Topics   Alcohol use: Not Currently   FAMILY  HX:  Family History  Problem Relation Age of Onset   Varicose Veins Neg Hx       ALLERGIES:  Allergies  Allergen Reactions   Lisinopril    No Known Allergies      PERTINENT MEDICATIONS:  Outpatient Encounter Medications as of 12/22/2022  Medication Sig   acetaminophen (TYLENOL) 325 MG suppository Place 650 mg rectally every 4 (four) hours as needed for fever or mild pain.   aspirin 81 MG chewable tablet Chew 81 mg by mouth daily.   brimonidine-timolol (COMBIGAN) 0.2-0.5 % ophthalmic solution Place 1 drop into both eyes 2 (two) times daily.   carvedilol (COREG) 12.5 MG tablet Take 12.5 mg by mouth at bedtime.   diclofenac sodium (VOLTAREN) 1 % GEL Apply 4 g topically 4 (four) times daily.   donepezil (ARICEPT) 10 MG tablet Take 10 mg by mouth at bedtime.   entecavir (BARACLUDE) 0.5 MG tablet Take 0.5 mg by mouth every 3 (three) days.   loperamide (IMODIUM A-D) 2 MG tablet Take 4 mg by mouth 3 (three) times daily as needed for diarrhea or loose stools.   LUMIGAN 0.01 % SOLN Place 1 drop into both eyes at bedtime.   magnesium oxide (MAG-OX) 400 (240 Mg) MG tablet Take 400 mg by mouth daily.   memantine (NAMENDA) 5  MG tablet Take 5 mg by mouth 2 (two) times daily.   Oyster Shell 500 MG TABS Take 1,000 mg by mouth daily.   pantoprazole (PROTONIX) 40 MG tablet Take 40 mg by mouth daily.   RHOPRESSA 0.02 % SOLN Place 1 drop into both eyes at bedtime.   simvastatin (ZOCOR) 10 MG tablet Take 10 mg by mouth at bedtime.   Skin Protectants, Misc. (MINERIN) CREA Apply 1 application topically 2 (two) times daily.   torsemide (DEMADEX) 20 MG tablet Take 80 mg by mouth daily.   No facility-administered encounter medications on file as of 12/22/2022.   Thank you for the opportunity to participate in the care of Anthony. Cravey.  The palliative care team will continue to follow. Please call our office at [phone] if we can be of additional assistance.   Sofiya Ezelle Z Noella Kipnis, NP ,

## 2022-12-22 NOTE — Addendum Note (Signed)
Addended by: Ivery Quale on: 12/22/2022 04:56 PM   Modules accepted: Level of Service

## 2022-12-23 ENCOUNTER — Ambulatory Visit: Payer: Medicaid Other | Admitting: Family

## 2022-12-28 NOTE — Progress Notes (Signed)
ELWORTH, LAUBENSTEIN ([id number]) [id number] Page 1 of 8 Visit Report for 12/04/2022 Chief Complaint Document Details Patient Name: Date of Service: Anthony Houston EL L. 12/04/2022 10:45 A M Medical Record Number: [id number] Patient Account Number: [id number] Date of Birth/Sex: Treating RN: [birth year] (72 y.o. Arthur Holms Primary Care Provider: PA Zenovia Jordan, NO Other Clinician: Referring Provider: Treating Provider/Extender: RO BSO N, MICHA EL G PIerce, Richardson Landry in Treatment: 0 Information Obtained from: Patient Chief Complaint 11/30/2022; patient arrives in clinic for review of an area on the left dorsal foot. Electronic Signature(s) Signed: 12/04/2022 1:28:22 PM By: Baltazar Najjar MD Entered By: Baltazar Najjar on 12/04/2022 13:11:50 -------------------------------------------------------------------------------- HPI Details Patient Name: Date of Service: Anthony Houston, MICHA EL L. 12/04/2022 10:45 A M Medical Record Number: [id number] Patient Account Number: [id number] Date of Birth/Sex: Treating RN: [birth year] (72 y.o. Arthur Holms Primary Care Provider: PA Zenovia Jordan, NO Other Clinician: Referring Provider: Treating Provider/Extender: RO BSO N, MICHA EL G PIerce, Richardson Landry in Treatment: 0 History of Present Illness HPI Description: ADMISSION 12/04/2022 This is a 73 year old man from the [place] which I gather is an assisted living. He has wounds on the left dorsal foot and at 1 point had 1 in a similar position on the right. However our intake nurse washed off eschar on the right and everything was fully epithelialized here. On the left he has a fairly large open area. There is absolutely no history of this wound the patient is not aware of how this formed and when. It would appear that it is being dressed with Unna paste and an Ace wrap. As mentioned the area on the right is healed. The patient is not a diabetic but he is on dialysis  probably secondary to nephrosclerosis. He has hypertension and a history of CVA coronary artery disease glaucoma and according to our intake nurse multiple myeloma. He has seen vascular surgery but as far as I can see only for shunt access. We could not obtain ABIs in this clinic Electronic Signature(s) Signed: 12/04/2022 1:28:22 PM By: Baltazar Najjar MD EMMYTT, REGGIO ([id number]) By: Baltazar Najjar MD [phone].pdf Page 2 of 8 Signed: 12/04/2022 1:28:22 PM Entered By: Baltazar Najjar on 12/04/2022 13:18:04 -------------------------------------------------------------------------------- Physical Exam Details Patient Name: Date of Service: Anthony Houston EL L. 12/04/2022 10:45 A M Medical Record Number: [id number] Patient Account Number: [id number] Date of Birth/Sex: Treating RN: 12-18-1950 (71 y.o. Arthur Holms Primary Care Provider: PA Zenovia Jordan, NO Other Clinician: Referring Provider: Treating Provider/Extender: RO BSO N, MICHA EL G PIerce, Richardson Landry in Treatment: 0 Constitutional Sitting or standing Blood Pressure is within target range for patient.. Pulse regular and within target range for patient.Marland Kitchen Respirations regular, non-labored and within target range.. Temperature is normal and within the target range for the patient.Marland Kitchen appears in no distress. Cardiovascular Pedal pulses are faint but palpable at the dorsalis pedis and posterior tibial bilaterally.. No edema.. Notes Wound exam; fairly extensive area on the left dorsal foot. Nevertheless granulation looks fairly healthy in fact in some areas of this hyper granulated. There is no evidence of surrounding infection. The area on the right is fully epithelialized Electronic Signature(s) Signed: 12/04/2022 1:28:22 PM By: Baltazar Najjar MD Entered By: Baltazar Najjar on 12/04/2022 13:19:37 -------------------------------------------------------------------------------- Physician Orders  Details Patient Name: Date of Service: Anthony Houston EL L. 12/04/2022 10:45 A M Medical Record Number: [id number] Patient Account Number: [id number] Date of Birth/Sex: Treating RN: May 24, 1951 (72 y.o. Anthony Houston, Selena Batten  Primary Care Provider: PA TIENT, NO Other Clinician: Referring Provider: Treating Provider/Extender: RO BSO N, MICHA EL G PIerce, Richardson Landry in Treatment: 0 Verbal / Phone Orders: No Diagnosis Coding Follow-up Appointments Return Appointment in 2 weeks. Nurse Visit as needed Home Health Home Health Company: - Monday, Wednesday, Friday unless he has a wound care visit scheduled. CONTINUE Home Health for wound care. May utilize formulary equivalent dressing for wound treatment orders unless otherwise specified. Home Health Nurse may visit PRN to address patients wound care needs. Scheduled days for dressing changes to be completed; exception, patient has scheduled wound care visit that day. **Please direct any NON-WOUND related issues/requests for orders to patient's Primary Care Physician. **If current dressing causes CLEMONS, STEPLER ([id number]) [id number] Page 3 of 8 regression in wound condition, may D/C ordered dressing product/s and apply Normal Saline Moist Dressing daily until next Wound Healing Center or Other MD appointment. **Notify Wound Healing Center of regression in wound condition at [phone]. Bathing/ Shower/ Hygiene May shower; gently cleanse wound with antibacterial soap, rinse and pat dry prior to dressing wounds No tub bath. Anesthetic (Use 'Patient Medications' Section for Anesthetic Order Entry) Wound #1 Left,Dorsal Foot Lidocaine applied to wound bed Edema Control - Lymphedema / Segmental Compressive Device / Other Elevate, Exercise Daily and Avoid Standing for Long Periods of Time. Off-Loading Other: - Given surgical shoe. Wear shoes that do not rub in the wounded area. Make sure surgical shoe is not  rubbing. Additional Orders / Instructions Follow Nutritious Diet and Increase Protein Intake Wound Treatment Wound #1 - Foot Wound Laterality: Dorsal, Left Cleanser: Vashe 5.8 (oz) (Home Health) (Generic) 3 x Per Week/30 Days Discharge Instructions: Use vashe 5.8 (oz) as directed Prim Dressing: Hydrofera Blue Ready Transfer Foam, 4x5 (in/in) (Home Health) 3 x Per Week/30 Days ary Discharge Instructions: Apply Hydrofera Blue Ready to wound bed as directed Secondary Dressing: ABD Pad 5x9 (in/in) (Home Health) 3 x Per Week/30 Days Discharge Instructions: Cover with ABD pad Secured With: Kerlix Roll Sterile or Non-Sterile 6-ply 4.5x4 (yd/yd) (Home Health) 3 x Per Week/30 Days Discharge Instructions: Apply Kerlix as directed Services and Therapies rterial Studies- Bilateral - ABIs, TBIs A Electronic Signature(s) Signed: 12/04/2022 1:28:22 PM By: Baltazar Najjar MD Signed: 12/08/2022 10:06:22 AM By: Elliot Gurney, BSN, RN, CWS, Kim RN, BSN Entered By: Elliot Gurney, BSN, RN, CWS, Kim on 12/04/2022 12:24:48 -------------------------------------------------------------------------------- Problem List Details Patient Name: Date of Service: Anthony Houston, MICHA EL L. 12/04/2022 10:45 A M Medical Record Number: [id number] Patient Account Number: [id number] Date of Birth/Sex: Treating RN: March 20, 1951 (72 y.o. 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 12/04/2022 10:45 A M Medical Record Number:  [id number] Patient Account Number: [id number] Date of Birth/Sex: Treating RN: 02-13-1951 (72 y.o. Arthur Holms Primary Care Provider: PA Zenovia Jordan, NO Other Clinician: Referring Provider: Treating Provider/Extender: RO BSO N, MICHA EL G PIerce, Richardson Landry in Treatment: 0 Subjective Chief Complaint Information obtained from Patient 11/30/2022; patient arrives in clinic for review of an area on the left dorsal foot. History of Present Illness (HPI) ADMISSION 12/04/2022 This is a 72 year old man from the [place] which I gather is an assisted living. He has wounds on the left dorsal foot and at 1 point had 1 in a similar position on the right. However our intake nurse washed off eschar on the right and everything was fully epithelialized here. On the left he has a fairly large open area. There is absolutely no history of this wound the patient is not aware of how this formed and when. It would appear that it is being dressed with Unna paste and an Ace wrap. As mentioned the area on the right is healed. The patient is not a diabetic but he is on dialysis probably secondary to nephrosclerosis. He has hypertension and a history of CVA coronary artery disease glaucoma and according to our intake nurse multiple myeloma. He has seen vascular surgery but as far as I can see only for shunt access. We could not obtain ABIs in this clinic Patient  History Allergies lisinopril Social History Alcohol Use - Never, Drug Use - No History, Caffeine Use - Never. Medical History Eyes Patient has history of Glaucoma Cardiovascular Patient has history of Coronary Artery Disease, Hypertension Endocrine SENECA, KHANNA ([id number]) [id number] Page 5 of 8 Denies history of Type II Diabetes Genitourinary Patient has history of End Stage Renal Disease - dialysis Neurologic Patient has history of Dementia Medical A Surgical History Notes nd Gastrointestinal GERD Neurologic Cognitive disiorder Review of Systems (ROS) Immunological Denies complaints or symptoms of Hives, Itching. Integumentary (Skin) Complains or has symptoms of Wounds. Musculoskeletal Denies complaints or symptoms of Muscle Pain, Muscle Weakness. Oncologic Multiple Myeoma Objective Constitutional Sitting or standing Blood Pressure is within target range for patient.. Pulse regular and within target range for patient.Marland Kitchen Respirations regular, non-labored and within target range.. Temperature is normal and within the target range for the patient.Marland Kitchen appears in no distress. Vitals Time Taken: 11:15 AM, Height: 72 in, Weight: 206 lbs, BMI: 27.9, Temperature: 98 F, Pulse: 64 bpm, Respiratory Rate: 18 breaths/min, Blood Pressure: 152/83 mmHg. Cardiovascular Pedal pulses are faint but palpable at the dorsalis pedis and posterior tibial bilaterally.. No edema.. General Notes: Wound exam; fairly extensive area on the left dorsal foot. Nevertheless granulation looks fairly healthy in fact in some areas of this hyper granulated. There is no evidence of surrounding infection. The area on the right is fully epithelialized Integumentary (Hair, Skin) Wound #1 status is Open. Original cause of wound was Gradually Appeared. The date acquired was: 11/13/2022. The wound is located on the Left,Dorsal Foot. The wound measures 2cm length x 6.2cm width x 0.2cm  depth; 9.739cm^2 area and 1.948cm^3 volume. There is Fat Layer (Subcutaneous Tissue) exposed. There is no tunneling or undermining noted. There is a medium amount of serosanguineous drainage noted. The wound margin is flat and intact. There is medium (34-66%) Houston, hyper - granulation within the wound bed. There is a small (1-33%) amount of necrotic tissue within the wound bed including Adherent Slough. 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 Nurse Visit as needed Home Health: Home Health Company: - Monday, Wednesday, Friday unless he has a wound care visit scheduled. CONTINUE Home Health for wound care. May utilize formulary equivalent dressing for wound treatment orders unless otherwise specified. Home Health Nurse may visit PRN to address patients wound care needs. Scheduled days for dressing changes to be completed; exception, patient has scheduled wound care visit that day. **Please direct any NON-WOUND related issues/requests for orders to patient's Primary Care Physician. **If current dressing causes regression in wound condition, may D/C ordered dressing product/s and apply Normal Saline Moist Dressing daily until next Wound Healing Center or Other MD appointment. **Notify Wound Healing Center of regression in wound condition at [phone]. Bathing/ Shower/ Hygiene: May shower; gently cleanse wound with antibacterial soap, rinse and pat dry prior to dressing wounds No tub bath. Anesthetic (Use 'Patient Medications' Section for Anesthetic Order Entry): Wound #1 Left,Dorsal Foot: Lidocaine applied to wound bed CHADNEY, GERARDOT ([id number]) [id number] Page 6 of 8 Edema Control - Lymphedema / Segmental Compressive Device /  Other: Elevate, Exercise Daily and Avoid Standing for Long Periods of Time. Off-Loading: Other: - Given surgical shoe. Wear shoes that do not rub in the wounded area. Make sure surgical shoe is not rubbing. Additional Orders / Instructions: Follow Nutritious Diet and Increase Protein Intake Services and Therapies ordered were: Arterial Studies- Bilateral - ABIs, TBIs WOUND #1: - Foot Wound Laterality: Dorsal, Left Cleanser: Vashe 5.8 (oz) (Home Health) (Generic) 3 x Per Week/30 Days Discharge Instructions: Use vashe 5.8 (oz) as directed Prim Dressing: Hydrofera Blue Ready Transfer Foam, 4x5 (in/in) (Home Health) 3 x Per Week/30 Days ary Discharge Instructions: Apply Hydrofera Blue Ready to wound bed as directed Secondary Dressing: ABD Pad 5x9 (in/in) (Home Health) 3 x Per Week/30 Days Discharge Instructions: Cover with ABD pad Secured With: Kerlix Roll Sterile or Non-Sterile 6-ply 4.5x4 (yd/yd) (Home Health) 3 x Per Week/30 Days Discharge Instructions: Apply Kerlix as directed 1. #1 we are going to use Hydrofera Blue ABD and kerlix 2. The exact etiology of this wound is unclear however it is quite possible he has an arterial insufficiency issue. 3. Formal ABIs and TBI's and arterial Dopplers ordered 4. No debridement no culture. 5. I am assuming he has home health coming to the assisted living to change the dressing. Our intake nurse will attempt to clarify which company is coming and see if we can use Hydrofera Blue in place of what they were doing Electronic Signature(s) Signed: 12/08/2022 5:03:09 PM By: Baltazar Najjar MD Previous Signature: 12/04/2022 1:28:22 PM Version By: Baltazar Najjar MD Entered By: Baltazar Najjar on 12/08/2022 17:03:08 -------------------------------------------------------------------------------- ROS/PFSH Details Patient Name: Date of Service: Anthony Houston, MICHA EL L. 12/04/2022 10:45 A M Medical Record Number: [id number] Patient Account Number:  [id number] Date of Birth/Sex: Treating RN: 1950/12/22 (72 y.o. Arthur Holms Primary Care Provider: PA Zenovia Jordan, NO Other Clinician: Referring Provider: Treating Provider/Extender: RO BSO N, MICHA EL G PIerce, Richardson Landry in Treatment: 0 Immunological Complaints and Symptoms: Negative for: Hives; Itching Integumentary (Skin) Complaints and Symptoms: Positive for: Wounds Musculoskeletal Complaints and Symptoms: Negative for: Muscle Pain; Muscle Weakness Eyes Medical History: Positive for: Glaucoma Cardiovascular Medical History: Positive for: Coronary Artery Disease; Hypertension KYRIN, THEURER ([id number]) [id number] Page 7 of 8 Gastrointestinal Medical History: Past Medical History Notes: GERD Endocrine Medical History: Negative for: Type II Diabetes Genitourinary Medical  History: Positive for: End Stage Renal Disease - dialysis Neurologic Medical History: Positive for: Dementia Past Medical History Notes: Cognitive disiorder Oncologic Complaints and Symptoms: Review of System Notes: Multiple Myeoma HBO Extended History Items Eyes: Glaucoma Immunizations Pneumococcal Vaccine: Received Pneumococcal Vaccination: Yes Received Pneumococcal Vaccination On or After 60th Birthday: Yes Implantable Devices No devices added Family and Social History Alcohol Use: Never; Drug Use: No History; Caffeine Use: Never Electronic Signature(s) Signed: 12/04/2022 1:28:22 PM By: Baltazar Najjar MD Signed: 12/08/2022 10:06:22 AM By: Elliot Gurney, BSN, RN, CWS, Kim RN, BSN Entered By: Elliot Gurney, BSN, RN, CWS, Kim on 12/04/2022 11:42:18 -------------------------------------------------------------------------------- SuperBill Details Patient Name: Date of Service: Anthony Houston EL L. 12/04/2022 Medical Record Number: [id number] Patient Account Number: [id number] Date of Birth/Sex: Treating RN: December 29, 1950 (72 y.o. 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 failure, with stage 5 chronic kidney disease, or end stage renal disease Facility Procedures : CPT4 Code: [id number] Description: 99214 - WOUND CARE VISIT-LEV 4 EST PT Modifier: Quantity: 1 Physician Procedures : CPT4 Code Description Modifier [id number] WC PHYS LEVEL 3 NEW PT ICD-10 Diagnosis Description L97.528 Non-pressure chronic ulcer of other part of left foot with other specified severity I70.245 Atherosclerosis of native arteries of left leg with  ulceration of other part of foot 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

## 2022-12-30 ENCOUNTER — Ambulatory Visit (INDEPENDENT_AMBULATORY_CARE_PROVIDER_SITE_OTHER): Payer: Medicaid Other

## 2022-12-30 DIAGNOSIS — M7989 Other specified soft tissue disorders: Secondary | ICD-10-CM

## 2022-12-30 DIAGNOSIS — S81809S Unspecified open wound, unspecified lower leg, sequela: Secondary | ICD-10-CM | POA: Diagnosis not present

## 2022-12-31 LAB — VAS US ABI WITH/WO TBI
Left ABI: 1.22
Right ABI: 1.03

## 2023-01-01 ENCOUNTER — Encounter: Payer: Medicaid Other | Attending: Physician Assistant | Admitting: Physician Assistant

## 2023-01-01 DIAGNOSIS — L97528 Non-pressure chronic ulcer of other part of left foot with other specified severity: Secondary | ICD-10-CM | POA: Diagnosis not present

## 2023-01-01 DIAGNOSIS — N186 End stage renal disease: Secondary | ICD-10-CM | POA: Insufficient documentation

## 2023-01-01 DIAGNOSIS — Z992 Dependence on renal dialysis: Secondary | ICD-10-CM | POA: Diagnosis not present

## 2023-01-01 DIAGNOSIS — H409 Unspecified glaucoma: Secondary | ICD-10-CM | POA: Insufficient documentation

## 2023-01-01 DIAGNOSIS — I1311 Hypertensive heart and chronic kidney disease without heart failure, with stage 5 chronic kidney disease, or end stage renal disease: Secondary | ICD-10-CM | POA: Insufficient documentation

## 2023-01-01 DIAGNOSIS — C9000 Multiple myeloma not having achieved remission: Secondary | ICD-10-CM | POA: Insufficient documentation

## 2023-01-01 DIAGNOSIS — I70245 Atherosclerosis of native arteries of left leg with ulceration of other part of foot: Secondary | ICD-10-CM | POA: Insufficient documentation

## 2023-01-01 DIAGNOSIS — I2510 Atherosclerotic heart disease of native coronary artery without angina pectoris: Secondary | ICD-10-CM | POA: Insufficient documentation

## 2023-01-01 DIAGNOSIS — F0390 Unspecified dementia without behavioral disturbance: Secondary | ICD-10-CM | POA: Insufficient documentation

## 2023-01-01 DIAGNOSIS — L97529 Non-pressure chronic ulcer of other part of left foot with unspecified severity: Secondary | ICD-10-CM | POA: Diagnosis present

## 2023-01-01 NOTE — Progress Notes (Signed)
TROI, FLORENDO ([id number]) [id number] Page 1 of 7 Visit Report for 01/01/2023 Chief Complaint Document Details Patient Name: Date of Service: Anthony Houston EL L. 01/01/2023 10:45 A M Medical Record Number: [id number] Patient Account Number: [id number] Date of Birth/Sex: Treating RN: [birth year] (72 y.o. Anthony Houston) Anthony Houston Primary Care Provider: SYSTEM, PCP Other Clinician: Referring Provider: Treating Provider/Extender: Jamelle Rushing in Treatment: 4 Information Obtained from: Patient Chief Complaint 11/30/2022; patient arrives in clinic for review of an area on the left dorsal foot. Electronic Signature(s) Signed: 01/01/2023 10:51:30 AM By: Allen Derry PA-C Entered By: Allen Derry on 01/01/2023 10:51:30 -------------------------------------------------------------------------------- HPI Details Patient Name: Date of Service: Anthony Houston EL L. 01/01/2023 10:45 A M Medical Record Number: [id number] Patient Account Number: [id number] Date of Birth/Sex: Treating RN: 04-14-1951 (72 y.o. Anthony Houston) Anthony Houston Primary Care Provider: SYSTEM, PCP Other Clinician: Referring Provider: Treating Provider/Extender: Jamelle Rushing in Treatment: 4 History of Present Illness HPI Description: ADMISSION 12/04/2022 This is a 72 year old man from the [place] which I gather is an assisted living. He has wounds on the left dorsal foot and at 1 point had 1 in a similar position on the right. However our intake nurse washed off eschar on the right and everything was fully epithelialized here. On the left he has a fairly large open area. There is absolutely no history of this wound the patient is not aware of how this formed and when. It would appear that it is being dressed with Unna paste and an Ace wrap. As mentioned the area on the right is healed. The patient is not a diabetic but he is on dialysis probably secondary to  nephrosclerosis. He has hypertension and a history of CVA coronary artery disease glaucoma and according to our intake nurse multiple myeloma. He has seen vascular surgery but as far as I can see only for shunt access. We could not obtain ABIs in this clinic 01-01-2023 upon evaluation today patient's wounds actually appear to be doing very well. This is the first time of seeing this gentleman he was actually admitted by Dr. Leanord Hawking back on May 24. I am not sure why he has not been here for the past month. With that being said I think there may have been some missed visits that happened in the interim. Fortunately I do not see any evidence of active infection locally nor systemically at this time which is great news. No fevers, chills, nausea, vomiting, or diarrhea. CHEVY, SWEIGERT ([id number]) [id number] Page 2 of 7 Electronic Signature(s) Signed: 01/01/2023 12:00:49 PM By: Allen Derry PA-C Entered By: Allen Derry on 01/01/2023 12:00:48 -------------------------------------------------------------------------------- Physical Exam Details Patient Name: Date of Service: Anthony Houston EL L. 01/01/2023 10:45 A M Medical Record Number: [id number] Patient Account Number: [id number] Date of Birth/Sex: Treating RN: 24-Jan-1951 (72 y.o. Anthony Houston) Anthony Houston Primary Care Provider: SYSTEM, PCP Other Clinician: Referring Provider: Treating Provider/Extender: Jamelle Rushing in Treatment: 4 Constitutional Well-nourished and well-hydrated in no acute distress. Respiratory normal breathing without difficulty. Psychiatric this patient is able to make decisions and demonstrates good insight into disease process. Alert and Oriented x 3. pleasant and cooperative. Notes Upon inspection patient's wounds actually appear to be doing quite well. I do not see any signs of active infection at this time which is great news and in general I do think that we are moving in  the right direction here. Electronic Signature(s) Signed: 01/01/2023 12:03:15 PM  By: Allen Derry PA-C Entered By: Allen Derry on 01/01/2023 12:03:15 -------------------------------------------------------------------------------- Physician Orders Details Patient Name: Date of Service: A Marc Morgans EL L. 01/01/2023 10:45 A M Medical Record Number: [id number] Patient Account Number: [id number] Date of Birth/Sex: Treating RN: [birth year] (72 y.o. 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 Nurse Visit as needed Home Health Home Health Company: - Monday, Wednesday, Friday unless he has a wound care visit scheduled. CONTINUE Home Health for wound care. May utilize formulary equivalent dressing for wound treatment orders unless otherwise specified. Home Health Nurse may visit PRN to address patients wound care needs. Scheduled days for dressing changes to be completed; exception, patient has scheduled wound care visit that day. **Please direct any NON-WOUND related issues/requests for orders to patient's Primary Care Physician. **If current dressing causes regression in wound condition, may D/C ordered dressing product/s and apply Normal Saline Moist Dressing daily until next Wound Healing Center or Other MD appointment. **Notify Wound  Healing Center of regression in wound condition at [phone]. Bathing/ Shower/ Hygiene May shower; gently cleanse wound with antibacterial soap, rinse and pat dry prior to dressing wounds No tub bath. Anesthetic (Use 'Patient Medications' Section for Anesthetic Order Entry) Wound #1 Left,Dorsal Foot Lidocaine applied to wound bed Edema Control - Lymphedema / Segmental Compressive Device / Other Elevate, Exercise Daily and Avoid Standing for Long Periods of Time. Off-Loading Other: - Given surgical shoe. Wear shoes that do not rub in the wounded area. Make sure surgical shoe is not rubbing. Additional Orders / Instructions Follow Nutritious Diet and Increase Protein Intake Wound Treatment Wound #1 - Foot Wound Laterality: Dorsal, Left Cleanser: Vashe 5.8 (oz) (Home Health) (Generic) 3 x Per Week/30 Days Discharge Instructions: Use vashe 5.8 (oz) as directed Prim Dressing: Hydrofera Blue Ready Transfer Foam, 4x5 (in/in) (Home Health) 3 x Per Week/30 Days ary Discharge Instructions: Apply Hydrofera Blue Ready to wound bed as directed Secondary Dressing: ABD Pad 5x9 (in/in) (Home Health) 3 x Per Week/30 Days Discharge Instructions: Cover with ABD pad Secured With: Kerlix Roll Sterile or Non-Sterile 6-ply 4.5x4 (yd/yd) (Home Health) 3 x Per Week/30 Days Discharge Instructions: Apply Kerlix as directed Wound #2 - Lower Leg Wound Laterality: Right, Medial, Distal Cleanser: Vashe 5.8 (oz) (Home Health) (Generic) 3 x Per Week/30 Days Discharge Instructions: Use vashe 5.8 (oz) as directed Prim Dressing: Hydrofera Blue Ready Transfer Foam, 4x5 (in/in) (Home Health) 3 x Per Week/30 Days ary Discharge Instructions: Apply Hydrofera Blue Ready to wound bed as directed Secondary Dressing: ABD Pad 5x9 (in/in) (Home Health) 3 x Per Week/30 Days Discharge Instructions: Cover with ABD pad Secured With: Kerlix Roll Sterile or Non-Sterile 6-ply 4.5x4 (yd/yd) (Home Health) 3 x Per Week/30 Days Discharge  Instructions: Apply Kerlix as directed Electronic Signature(s) Signed: 01/01/2023 1:44:40 PM By: Allen Derry PA-C Signed: 01/04/2023 9:32:51 AM By: Anthony Pax RN Entered By: Anthony Houston on 01/01/2023 11:24:38 JAYQUAN, BRADSHER ([id number]) [id number] Page 4 of 7 -------------------------------------------------------------------------------- Problem List  Details Patient Name: Date of Service: Anthony Houston EL L. 01/01/2023 10:45 A M Medical Record Number: [id number] Patient Account Number: [id number] Date of Birth/Sex: Treating RN: Nov 11, 1950 (72 y.o. 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 01/01/2023 10:45 A M Medical Record Number: [id number] Patient Account Number: [id number] Date of Birth/Sex: Treating RN: August 24, 1950 (72 y.o. Anthony Houston) Anthony Houston Primary Care Provider: SYSTEM, PCP Other Clinician: Referring Provider: Treating Provider/Extender: Jamelle Rushing in Treatment: [address] KEYAN, FOLSON ([id number])  [id number] Page 5 of 7 Chief Complaint Information obtained from Patient 11/30/2022; patient arrives in clinic for review of an area on the left dorsal foot. History of Present Illness (HPI) ADMISSION 12/04/2022 This is a 72 year old man from the [place] which I gather is an assisted living. He has wounds on the left dorsal foot and at 1 point had 1 in a similar position on the right. However our intake nurse washed off eschar on the right and everything was fully epithelialized here. On the left he has a fairly large open area. There is absolutely no history of this wound the patient is not aware of how this formed and when. It would appear that it is being dressed with Unna paste and an Ace wrap. As mentioned the area on the right is healed. The patient is not a diabetic but he is on dialysis probably secondary to nephrosclerosis. He has hypertension and a history of CVA coronary artery disease glaucoma and according to our intake nurse multiple myeloma. He has seen vascular surgery but as far as I can see only for shunt access. We could not obtain ABIs in this clinic 01-01-2023 upon evaluation today patient's wounds actually appear to be doing very well. This is the first time of seeing this gentleman he was actually admitted by Dr. Leanord Hawking back on May 24. I am not sure why he has not been here for the past month. With that being said I think there may have been some missed visits that happened in the interim. Fortunately I do not see any evidence of active infection locally nor systemically at this time which is great news. No fevers, chills, nausea, vomiting, or diarrhea. Objective Constitutional Well-nourished and well-hydrated in no acute distress. Vitals Time Taken: 10:51 AM, Height: 72 in, Weight: 206 lbs, BMI: 27.9, Temperature: 97.6 F, Pulse: 71 bpm, Respiratory Rate: 18 breaths/min, Blood Pressure: 166/81 mmHg. Respiratory normal breathing without  difficulty. Psychiatric this patient is able to make decisions and demonstrates good insight into disease process. Alert and Oriented x 3. pleasant and cooperative. General Notes: Upon inspection patient's wounds actually appear to be doing quite well. I do not see any signs of active infection at this time which is great news and in general I do think that we  are moving in the right direction here. Integumentary (Hair, Skin) Wound #1 status is Open. Original cause of wound was Gradually Appeared. The date acquired was: 11/13/2022. The wound has been in treatment 4 weeks. The wound is located on the Left,Dorsal Foot. The wound measures 1.5cm length x 3.5cm width x 0.1cm depth; 4.123cm^2 area and 0.412cm^3 volume. There is Fat Layer (Subcutaneous Tissue) exposed. There is no tunneling or undermining noted. There is a medium amount of serosanguineous drainage noted. The wound margin is flat and intact. There is medium (34-66%) red, hyper - granulation within the wound bed. There is a small (1-33%) amount of necrotic tissue within the wound bed including Adherent Slough. Wound #2 status is Open. Original cause of wound was Gradually Appeared. The date acquired was: 12/28/2022. The wound is located on the Right,Distal,Medial Lower Leg. The wound measures 4cm length x 2cm width x 0.2cm depth; 6.283cm^2 area and 1.257cm^3 volume. There is Fat Layer (Subcutaneous Tissue) exposed. There is no tunneling or undermining noted. There is a medium amount of serosanguineous drainage noted. There is no granulation within the wound bed. There is a large (67-100%) amount of necrotic tissue within the wound bed including Adherent Slough. 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 Nurse Visit as needed Home Health: Home Health Company: - Monday, Wednesday, Friday unless he has a wound care visit scheduled. TRAYE, BATES ([id number]) [id number] Page 6 of 7 CONTINUE Home Health for wound care. May utilize formulary equivalent dressing for wound treatment orders unless otherwise specified. Home Health Nurse may visit PRN to address patients wound care needs. Scheduled days for dressing changes to be completed; exception, patient has scheduled wound care visit that day. **Please direct any NON-WOUND related issues/requests for orders to patient's Primary Care Physician. **If current dressing causes regression in wound condition, may D/C ordered dressing product/s and apply Normal Saline Moist Dressing daily until next Wound Healing Center or Other MD appointment. **Notify Wound Healing Center of regression in wound condition at [phone]. Bathing/ Shower/ Hygiene: May shower; gently cleanse wound with antibacterial soap, rinse and pat dry prior to dressing wounds No tub bath. Anesthetic (Use 'Patient Medications' Section for Anesthetic Order Entry): Wound #1 Left,Dorsal Foot: Lidocaine applied to wound bed Edema Control - Lymphedema / Segmental Compressive Device / Other: Elevate, Exercise Daily and Avoid Standing for Long Periods of Time. Off-Loading: Other: - Given surgical shoe. Wear shoes that do not rub in the wounded area. Make sure surgical shoe is not rubbing. Additional Orders / Instructions: Follow Nutritious Diet and Increase Protein Intake WOUND #1: - Foot Wound Laterality: Dorsal, Left Cleanser: Vashe 5.8 (oz) (Home Health) (Generic) 3 x Per Week/30 Days Discharge Instructions: Use vashe 5.8 (oz) as directed Prim Dressing: Hydrofera Blue Ready Transfer Foam, 4x5 (in/in) (Home Health) 3 x Per Week/30  Days ary Discharge Instructions: Apply Hydrofera Blue Ready to wound bed as directed Secondary Dressing: ABD Pad 5x9 (in/in) (Home Health) 3 x Per Week/30 Days Discharge Instructions: Cover with ABD pad Secured With: Kerlix Roll Sterile or Non-Sterile 6-ply 4.5x4 (yd/yd) (Home Health) 3 x Per Week/30 Days Discharge Instructions: Apply Kerlix as directed WOUND #2: - Lower Leg Wound Laterality: Right, Medial, Distal  Cleanser: Vashe 5.8 (oz) (Home Health) (Generic) 3 x Per Week/30 Days Discharge Instructions: Use vashe 5.8 (oz) as directed Prim Dressing: Hydrofera Blue Ready Transfer Foam, 4x5 (in/in) (Home Health) 3 x Per Week/30 Days ary Discharge Instructions: Apply Hydrofera Blue Ready to wound bed as directed Secondary Dressing: ABD Pad 5x9 (in/in) (Home Health) 3 x Per Week/30 Days Discharge Instructions: Cover with ABD pad Secured With: Kerlix Roll Sterile or Non-Sterile 6-ply 4.5x4 (yd/yd) (Home Health) 3 x Per Week/30 Days Discharge Instructions: Apply Kerlix as directed 1. I would recommend currently that we have the patient continue to monitor for any signs of infection or worsening overall. The good news is I really think that we are making really good progress towards closure here and I think that he is looking much better. Will plan to recheck and see where things stand at follow-up we will plan for 2-week follow-up. 2. I am good recommend specifically the Hydrofera Blue and then again will be using the ABD pad and roll gauze to secure in place. We will see patient back for reevaluation in 1 week here in the clinic. If anything worsens or changes patient will contact our office for additional recommendations. Electronic Signature(s) Signed: 01/01/2023 12:03:47 PM By: Allen Derry PA-C Entered By: Allen Derry on 01/01/2023 12:03:46 -------------------------------------------------------------------------------- SuperBill Details Patient Name: Date of Service: Anthony Houston EL  L. 01/01/2023 Medical Record Number: [id number] Patient Account Number: [id number] Date of Birth/Sex: Treating RN: 1950-07-23 (72 y.o. 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 heart and chronic kidney disease without heart failure, with stage 5 chronic kidney disease, or end stage renal disease JADA, KUHNERT ([id number]) [id number] Page 7 of 7 Facility Procedures : CPT4 Code: [id number] Description: 99213 - WOUND CARE VISIT-LEV 3 EST PT Modifier: Quantity: 1 Physician Procedures : CPT4 Code Description Modifier [id number] 99213 - WC PHYS LEVEL 3 - EST PT ICD-10 Diagnosis Description L97.528 Non-pressure chronic ulcer of other part of left foot with other specified severity I70.245 Atherosclerosis of native arteries of left leg with  ulceration of other part of foot 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

## 2023-01-04 NOTE — Progress Notes (Signed)
ASANTE, BLANDA ([id number]) [id number] Page 1 of 11 Visit Report for 01/01/2023 Arrival Information Details Patient Name: Date of Service: Anthony Pugh EL L. 01/01/2023 10:45 A M Medical Record Number: [id number] Patient Account Number: [id number] Date of Birth/Sex: Treating RN: 08/10/1950 (72 y.o. [name]) Yevonne Pax Primary Care Chiyoko Torrico: SYSTEM, PCP Other Clinician: Referring Staton Markey: Treating Berlynn Warsame/Extender: Jamelle Rushing in Treatment: 4 Visit Information History Since Last Visit Added or deleted any medications: No Patient Arrived: Wheel Chair Any new allergies or adverse reactions: No Arrival Time: 10:50 Had a fall or experienced change in No Accompanied By: self activities of daily living that may affect Transfer Assistance: None risk of falls: Patient Identification Verified: Yes Signs or symptoms of abuse/neglect since last visito No Secondary Verification Process Completed: Yes Hospitalized since last visit: No Patient Requires Transmission-Based Precautions: No Implantable device outside of the clinic excluding No Patient Has Alerts: Yes cellular tissue based products placed in the center Patient Alerts: Patient on Blood Thinner since last visit: 81mg  aspirin Has Dressing in Place as Prescribed: Yes Not DIAbetic Pain Present Now: No Dialysis patient Electronic Signature(s) Signed: 01/04/2023 9:32:51 AM By: Yevonne Pax RN Entered By: Yevonne Pax on 01/01/2023 10:51:26 -------------------------------------------------------------------------------- Clinic Level of Care Assessment Details Patient Name: Date of Service: Anthony Pugh EL L. 01/01/2023 10:45 A M Medical Record Number: [id number] Patient Account Number: [id number] Date of Birth/Sex: Treating RN: [birth year] (72 y.o. [name]) Yevonne Pax Primary Care Ladarion Munyon: SYSTEM, PCP Other Clinician: Referring Dontee Jaso: Treating Bodey Frizell/Extender: Jamelle Rushing in Treatment: 4 Clinic Level of Care Assessment Items TOOL 4 Quantity Score X- 1 0 Use when only an EandM is performed on FOLLOW-UP visit ASSESSMENTS - Nursing Assessment / Reassessment X- 1 10 Reassessment of Co-morbidities (includes updates in patient status) X- 1 5 Reassessment of Adherence to Treatment Plan Anthony Houston, Anthony Houston ([id number]) [phone].pdf Page 2 of 11 ASSESSMENTS - Wound and Skin A ssessment / Reassessment []  - Simple Wound Assessment / Reassessment - one wound 0 X- 2 5 Complex Wound Assessment / Reassessment - multiple wounds []  - 0 Dermatologic / Skin Assessment (not related to wound area) ASSESSMENTS - Focused Assessment []  - 0 Circumferential Edema Measurements - multi extremities []  - 0 Nutritional Assessment / Counseling / Intervention []  - 0 Lower Extremity Assessment (monofilament, tuning fork, pulses) []  - 0 Peripheral Arterial Disease Assessment (using hand held doppler) ASSESSMENTS - Ostomy and/or Continence Assessment and Care []  - 0 Incontinence Assessment and Management []  - 0 Ostomy Care Assessment and Management (repouching, etc.) PROCESS - Coordination of Care X - Simple Patient / Family Education for ongoing care 1 15 []  - 0 Complex (extensive) Patient / Family Education for ongoing care []  - 0 Staff obtains Chiropractor, Records, T Results / Process Orders est []  - 0 Staff telephones HHA, Nursing Homes / Clarify orders / etc []  - 0 Routine Transfer to another Facility (non-emergent condition) []  - 0 Routine Hospital Admission (non-emergent condition) []  - 0 New Admissions / Manufacturing engineer / Ordering NPWT Apligraf, etc. , []  - 0 Emergency Hospital Admission (emergent condition) X- 1 10 Simple Discharge Coordination []  - 0 Complex (extensive) Discharge Coordination PROCESS - Special Needs []  - 0 Pediatric / Minor Patient Management []  - 0 Isolation Patient  Management []  - 0 Hearing / Language / Visual special needs []  - 0 Assessment of Community assistance (transportation, D/C planning, etc.) []  - 0 Additional assistance / Altered mentation []  - 0 Support  Surface(s) Assessment (bed, cushion, seat, etc.) INTERVENTIONS - Wound Cleansing / Measurement []  - 0 Simple Wound Cleansing - one wound X- 2 5 Complex Wound Cleansing - multiple wounds X- 1 5 Wound Imaging (photographs - any number of wounds) []  - 0 Wound Tracing (instead of photographs) []  - 0 Simple Wound Measurement - one wound X- 2 5 Complex Wound Measurement - multiple wounds INTERVENTIONS - Wound Dressings X - Small Wound Dressing one or multiple wounds 2 10 []  - 0 Medium Wound Dressing one or multiple wounds []  - 0 Large Wound Dressing one or multiple wounds []  - 0 Application of Medications - topical []  - 0 Application of Medications - injection INTERVENTIONS - Miscellaneous []  - 0 External ear exam Anthony Houston, Anthony Houston ([id number]) [id number] Page 3 of 11 []  - 0 Specimen Collection (cultures, biopsies, blood, body fluids, etc.) []  - 0 Specimen(s) / Culture(s) sent or taken to Lab for analysis []  - 0 Patient Transfer (multiple staff / Michiel Sites Lift / Similar devices) []  - 0 Simple Staple / Suture removal (25 or less) []  - 0 Complex Staple / Suture removal (26 or more) []  - 0 Hypo / Hyperglycemic Management (close monitor of Blood Glucose) []  - 0 Ankle / Brachial Index (ABI) - do not check if billed separately X- 1 5 Vital Signs Has the patient been seen at the hospital within the last three years: Yes Total Score: 100 Level Of Care: New/Established - Level 3 Electronic Signature(s) Signed: 01/04/2023 9:32:51 AM By: Yevonne Pax RN Entered By: Yevonne Pax on 01/01/2023 11:29:41 -------------------------------------------------------------------------------- Encounter Discharge Information Details Patient Name: Date of  Service: Anthony Houston, Anthony EL L. 01/01/2023 10:45 A M Medical Record Number: [id number] Patient Account Number: [id number] Date of Birth/Sex: Treating RN: Nov 04, 1950 (72 y.o. [name]) Yevonne Pax Primary Care Shloma Roggenkamp: SYSTEM, PCP Other Clinician: Referring Andrzej Scully: Treating Aahan Marques/Extender: Jamelle Rushing in Treatment: 4 Encounter Discharge Information Items Discharge Condition: Stable Ambulatory Status: Wheelchair Discharge Destination: Home Health Orders Sent: Yes Transportation: Private Auto Accompanied By: seld Schedule Follow-up Appointment: No Clinical Summary of Care: Notes assited living [facility] [place], orders sent to home health Electronic Signature(s) Signed: 01/04/2023 9:32:51 AM By: Yevonne Pax RN Entered By: Yevonne Pax on 01/01/2023 11:31:39 Anthony Houston ([id number]) [id number] Page 4 of 11 -------------------------------------------------------------------------------- Lower Extremity Assessment Details Patient Name: Date of Service: Anthony Pugh EL L. 01/01/2023 10:45 A M Medical Record Number: [id number] Patient Account Number: [id number] Date of Birth/Sex: Treating RN: 1950-07-26 (72 y.o. [name]) Yevonne Pax Primary Care Joelle Roswell: SYSTEM, PCP Other Clinician: Referring Devontay Celaya: Treating Siraj Dermody/Extender: Jamelle Rushing in Treatment: 4 Edema Assessment Assessed: [Left: No] [Right: No] Edema: [Left: Ye] [Right: s] Calf Left: Right: Point of Measurement: 33 cm From Medial Instep 34 cm Ankle Left: Right: Point of Measurement: 10 cm From Medial Instep 24 cm Electronic Signature(s) Signed: 01/04/2023 9:32:51 AM By: Yevonne Pax RN Entered By: Yevonne Pax on 01/01/2023 10:59:10 -------------------------------------------------------------------------------- Multi Wound Chart Details Patient Name: Date of Service: Anthony Pugh EL L. 01/01/2023 10:45 A M Medical Record Number:  [id number] Patient Account Number: [id number] Date of Birth/Sex: Treating RN: 1951-03-23 (72 y.o. [name]) Yevonne Pax Primary Care Alannis Hsia: SYSTEM, PCP Other Clinician: Referring Malaiah Viramontes: Treating Xochilth Standish/Extender: Jamelle Rushing in Treatment: 4 Vital Signs Height(in): 72 Pulse(bpm): 71 Weight(lbs): 206 Blood Pressure(mmHg): 166/81 Body Mass Index(BMI): 27.9 Temperature(F): 97.6 Respiratory Rate(breaths/min): 18 [1:Photos:] [N/A:N/A [id number] Page 5 of 11] Left, Dorsal Foot Right, Distal, Medial  Lower Leg N/A Wound Location: Gradually Appeared Gradually Appeared N/A Wounding Event: Venous Leg Ulcer Venous Leg Ulcer N/A Primary Etiology: Glaucoma, Coronary Artery Disease, Glaucoma, Coronary Artery Disease, N/A Comorbid History: Hypertension, End Stage Renal Hypertension, End Stage Renal Disease, Dementia Disease, Dementia 11/13/2022 12/28/2022 N/A Date Acquired: 4 0 N/A Weeks of Treatment: Open Open N/A Wound Status: No No N/A Wound Recurrence: 1.5x3.5x0.1 4x2x0.2 N/A Measurements L x W x D (cm) 4.123 6.283 N/A A (cm) : rea 0.412 1.257 N/A Volume (cm) : 57.70% N/A N/A % Reduction in Area: 78.90% N/A N/A % Reduction in Volume: Full Thickness Without Exposed Full Thickness Without Exposed N/A Classification: Support Structures Support Structures Medium Medium N/A Exudate Amount: Serosanguineous Serosanguineous N/A Exudate Type: Houston, brown Houston, brown N/A Exudate Color: Flat and Intact N/A N/A Wound Margin: Medium (34-66%) None Present (0%) N/A Granulation Amount: Houston, Hyper-granulation N/A N/A Granulation Quality: Small (1-33%) Large (67-100%) N/A Necrotic Amount: Fat Layer (Subcutaneous Tissue): Yes Fat Layer (Subcutaneous Tissue): Yes N/A Exposed Structures: Fascia: No Fascia: No Tendon: No Tendon: No Muscle: No Muscle: No Joint: No Joint: No Bone: No Bone: No None None  N/A Epithelialization: Treatment Notes Electronic Signature(s) Signed: 01/04/2023 9:32:51 AM By: Yevonne Pax RN Entered By: Yevonne Pax on 01/01/2023 10:59:14 -------------------------------------------------------------------------------- Multi-Disciplinary Care Plan Details Patient Name: Date of Service: Anthony Pugh EL L. 01/01/2023 10:45 A M Medical Record Number: [id number] Patient Account Number: [id number] Date of Birth/Sex: Treating RN: 1950/08/04 (72 y.o. [name]) Yevonne Pax Primary Care Octavious Zidek: SYSTEM, PCP Other Clinician: Referring Gaylen Pereira: Treating Hatsue Sime/Extender: Jamelle Rushing in Treatment: 4 Active Inactive Abuse / Safety / Falls / Self Care Management Nursing Diagnoses: Potential for falls Goals: Patient/caregiver will verbalize understanding of skin care regimen Date Initiated: 12/04/2022 Target Resolution Date: 12/04/2022 Anthony Houston, Anthony Houston ([id number]) [phone].pdf Page 6 of 11 Goal Status: Active Interventions: Provide education on safe transfers Treatment Activities: Patient referred to home care : 12/04/2022 Notes: Orientation to the Wound Care Program Nursing Diagnoses: Knowledge deficit related to the wound healing center program Goals: Patient/caregiver will verbalize understanding of the Wound Healing Center Program Date Initiated: 12/04/2022 Target Resolution Date: 12/04/2022 Goal Status: Active Interventions: Provide education on orientation to the wound center Notes: Soft Tissue Infection Nursing Diagnoses: Impaired tissue integrity Goals: Patient will remain free of wound infection Date Initiated: 12/04/2022 Target Resolution Date: 12/04/2022 Goal Status: Active Signs and symptoms of infection will be recognized early to allow for prompt treatment Date Initiated: 12/04/2022 Target Resolution Date: 12/04/2022 Goal Status: Active Interventions: Assess signs and symptoms of infection every  visit Notes: Wound/Skin Impairment Nursing Diagnoses: Impaired tissue integrity Goals: Ulcer/skin breakdown will have a volume reduction of 30% by week 4 Date Initiated: 12/04/2022 Target Resolution Date: 12/25/2022 Goal Status: Active Interventions: Assess ulceration(s) every visit Treatment Activities: Patient referred to home care : 12/04/2022 Notes: Electronic Signature(s) Signed: 01/04/2023 9:32:51 AM By: Yevonne Pax RN Entered By: Yevonne Pax on 01/01/2023 10:59:22 Anthony Houston ([id number]) [id number] Page 7 of 11 -------------------------------------------------------------------------------- Pain Assessment Details Patient Name: Date of Service: Anthony Pugh EL L. 01/01/2023 10:45 A M Medical Record Number: [id number] Patient Account Number: [id number] Date of Birth/Sex: Treating RN: March 02, 1951 (72 y.o. [name]) Yevonne Pax Primary Care Adrianne Shackleton: SYSTEM, PCP Other Clinician: Referring Audrey Eller: Treating Devlyn Parish/Extender: Jamelle Rushing in Treatment: 4 Active Problems Location of Pain Severity and Description of Pain Patient Has Paino No Site Locations Pain Management and Medication Current Pain Management: Electronic Signature(s) Signed: 01/04/2023  9:32:51 AM By: Yevonne Pax RN Entered By: Yevonne Pax on 01/01/2023 10:52:17 -------------------------------------------------------------------------------- Patient/Caregiver Education Details Patient Name: Date of Service: Anthony Houston 6/21/2024andnbsp10:45 A M Medical Record Number: [id number] Patient Account Number: [id number] Date of Birth/Gender: Treating RN: 1951/04/19 (72 y.o. [name]) Yevonne Pax Primary Care Physician: SYSTEM, PCP Other Clinician: Referring Physician: Treating Physician/Extender: Jamelle Rushing in Treatment: 4 Education Assessment Education Provided To: Patient Education Topics Provided Wound/Skin  Impairment: Handouts: Caring for Your Ulcer Methods: Explain/Verbal Responses: State content correctly Anthony Houston, Anthony Houston ([id number]) [id number] Page 8 of 11 Electronic Signature(s) Signed: 01/04/2023 9:32:51 AM By: Yevonne Pax RN Entered By: Yevonne Pax on 01/01/2023 10:59:36 -------------------------------------------------------------------------------- Wound Assessment Details Patient Name: Date of Service: Anthony Pugh EL L. 01/01/2023 10:45 A M Medical Record Number: [id number] Patient Account Number: [id number] Date of Birth/Sex: Treating RN: [birth year] (72 y.o. [name]) Yevonne Pax Primary Care Keziyah Kneale: SYSTEM, PCP Other Clinician: Referring Steele Stracener: Treating Jaleiyah Houston/Extender: Jamelle Rushing in Treatment: 4 Wound Status Wound Number: 1 Primary Venous Leg Ulcer Etiology: Wound Location: Left, Dorsal Foot Wound Open Wounding Event: Gradually Appeared Status: Date Acquired: 11/13/2022 Comorbid Glaucoma, Coronary Artery Disease, Hypertension, End Stage Weeks Of Treatment: 4 History: Renal Disease, Dementia Clustered Wound: No Photos Wound Measurements Length: (cm) 1.5 Width: (cm) 3.5 Depth: (cm) 0.1 Area: (cm) 4.123 Volume: (cm) 0.412 % Reduction in Area: 57.7% % Reduction in Volume: 78.9% Epithelialization: None Tunneling: No Undermining: No Wound Description Classification: Full Thickness Without Exposed Suppor Wound Margin: Flat and Intact Exudate Amount: Medium Exudate Type: Serosanguineous Exudate Color: [place], brown t Structures Foul Odor After Cleansing: No Slough/Fibrino Yes Wound Bed Granulation Amount: Medium (34-66%) Exposed Structure Granulation Quality: Houston, Hyper-granulation Fascia Exposed: No Necrotic Amount: Small (1-33%) Fat Layer (Subcutaneous Tissue) Exposed: Yes Necrotic Quality: Adherent Slough Tendon Exposed: No Muscle Exposed: No Joint Exposed: No Bone Exposed: No Anthony Houston, Anthony Houston  ([id number]) [id number] Page 9 of 11 Treatment Notes Wound #1 (Foot) Wound Laterality: Dorsal, Left Cleanser Vashe 5.8 (oz) Discharge Instruction: Use vashe 5.8 (oz) as directed Peri-Wound Care Topical Primary Dressing Hydrofera Blue Ready Transfer Foam, 4x5 (in/in) Discharge Instruction: Apply Hydrofera Blue Ready to wound bed as directed Secondary Dressing ABD Pad 5x9 (in/in) Discharge Instruction: Cover with ABD pad Secured With Kerlix Roll Sterile or Non-Sterile 6-ply 4.5x4 (yd/yd) Discharge Instruction: Apply Kerlix as directed Compression Wrap Compression Stockings Add-Ons Electronic Signature(s) Signed: 01/04/2023 9:32:51 AM By: Yevonne Pax RN Entered By: Yevonne Pax on 01/01/2023 10:58:45 -------------------------------------------------------------------------------- Wound Assessment Details Patient Name: Date of Service: Anthony Pugh EL L. 01/01/2023 10:45 A M Medical Record Number: [id number] Patient Account Number: [id number] Date of Birth/Sex: Treating RN: [birth year] (72 y.o. [name]) Yevonne Pax Primary Care Valen Mascaro: SYSTEM, PCP Other Clinician: Referring Kshawn Canal: Treating Kolbey Teichert/Extender: Jamelle Rushing in Treatment: 4 Wound Status Wound Number: 2 Primary Venous Leg Ulcer Etiology: Wound Location: Right, Distal, Medial Lower Leg Wound Open Wounding Event: Gradually Appeared Status: Date Acquired: 12/28/2022 Comorbid Glaucoma, Coronary Artery Disease, Hypertension, End Stage Weeks Of Treatment: 0 History: Renal Disease, Dementia Clustered Wound: No Photos Anthony Houston, Anthony Houston ([id number]) [id number] Page 10 of 11 Wound Measurements Length: (cm) 4 Width: (cm) 2 Depth: (cm) 0.2 Area: (cm) 6.283 Volume: (cm) 1.257 % Reduction in Area: % Reduction in Volume: Epithelialization: None Tunneling: No Undermining: No Wound Description Classification: Full Thickness Without Exposed  Support Exudate Amount: Medium Exudate Type: Serosanguineous Exudate Color: [place], brown Structures Foul Odor After Cleansing: No Slough/Fibrino  Yes Wound Bed Granulation Amount: None Present (0%) Exposed Structure Necrotic Amount: Large (67-100%) Fascia Exposed: No Necrotic Quality: Adherent Slough Fat Layer (Subcutaneous Tissue) Exposed: Yes Tendon Exposed: No Muscle Exposed: No Joint Exposed: No Bone Exposed: No Treatment Notes Wound #2 (Lower Leg) Wound Laterality: Right, Medial, Distal Cleanser Vashe 5.8 (oz) Discharge Instruction: Use vashe 5.8 (oz) as directed Peri-Wound Care Topical Primary Dressing Hydrofera Blue Ready Transfer Foam, 4x5 (in/in) Discharge Instruction: Apply Hydrofera Blue Ready to wound bed as directed Secondary Dressing ABD Pad 5x9 (in/in) Discharge Instruction: Cover with ABD pad Secured With Kerlix Roll Sterile or Non-Sterile 6-ply 4.5x4 (yd/yd) Discharge Instruction: Apply Kerlix as directed Compression Wrap Compression Stockings Add-Ons Electronic Signature(s) Signed: 01/04/2023 9:32:51 AM By: Yevonne Pax RN Entered By: Yevonne Pax on 01/01/2023 10:58:13 Anthony Houston ([id number]) [id number] Page 11 of 11 -------------------------------------------------------------------------------- Vitals Details Patient Name: Date of Service: Anthony Pugh EL L. 01/01/2023 10:45 A M Medical Record Number: [id number] Patient Account Number: [id number] Date of Birth/Sex: Treating RN: 26-Dec-1950 (72 y.o. [name]) Yevonne Pax Primary Care Jin Capote: SYSTEM, PCP Other Clinician: Referring Lason Eveland: Treating Versia Mignogna/Extender: Jamelle Rushing in Treatment: 4 Vital Signs Time Taken: 10:51 Temperature (F): 97.6 Height (in): 72 Pulse (bpm): 71 Weight (lbs): 206 Respiratory Rate (breaths/min): 18 Body Mass Index (BMI): 27.9 Blood Pressure (mmHg): 166/81 Reference Range: 80 - 120 mg / dl Electronic  Signature(s) Signed: 01/04/2023 9:32:51 AM By: Yevonne Pax RN Entered By: Yevonne Pax on 01/01/2023 10:52:09

## 2023-01-15 ENCOUNTER — Ambulatory Visit: Payer: Medicaid Other | Admitting: Physician Assistant

## 2023-01-21 ENCOUNTER — Encounter: Payer: Medicaid Other | Attending: Physician Assistant | Admitting: Physician Assistant

## 2023-01-21 DIAGNOSIS — I70245 Atherosclerosis of native arteries of left leg with ulceration of other part of foot: Secondary | ICD-10-CM | POA: Insufficient documentation

## 2023-01-21 DIAGNOSIS — I1311 Hypertensive heart and chronic kidney disease without heart failure, with stage 5 chronic kidney disease, or end stage renal disease: Secondary | ICD-10-CM | POA: Diagnosis not present

## 2023-01-21 DIAGNOSIS — I2510 Atherosclerotic heart disease of native coronary artery without angina pectoris: Secondary | ICD-10-CM | POA: Insufficient documentation

## 2023-01-21 DIAGNOSIS — L97528 Non-pressure chronic ulcer of other part of left foot with other specified severity: Secondary | ICD-10-CM | POA: Diagnosis present

## 2023-01-21 DIAGNOSIS — Z992 Dependence on renal dialysis: Secondary | ICD-10-CM | POA: Diagnosis not present

## 2023-01-21 DIAGNOSIS — Z8673 Personal history of transient ischemic attack (TIA), and cerebral infarction without residual deficits: Secondary | ICD-10-CM | POA: Diagnosis not present

## 2023-01-21 DIAGNOSIS — N186 End stage renal disease: Secondary | ICD-10-CM | POA: Diagnosis not present

## 2023-01-21 NOTE — Progress Notes (Addendum)
SIM, TRUNDLE ([id number]) [id number] Page 1 of 6 Visit Report for 01/21/2023 Chief Complaint Document Details Patient Name: Date of Service: Anthony Pugh EL L. 01/21/2023 2:30 PM Medical Record Number: [id number] Patient Account Number: [id number] Date of Birth/Sex: Treating RN: [birth year] (72 y.o. [name]) Yevonne Pax Primary Care Provider: SYSTEM, PCP Other Clinician: Referring Provider: Treating Provider/Extender: Jamelle Rushing in Treatment: 6 Information Obtained from: Patient Chief Complaint 11/30/2022; patient arrives in clinic for review of an area on the left dorsal foot. Electronic Signature(s) Signed: 01/21/2023 2:14:20 PM By: Allen Derry PA-C Entered By: Allen Derry on 01/21/2023 14:14:20 -------------------------------------------------------------------------------- HPI Details Patient Name: Date of Service: Anthony Pugh EL L. 01/21/2023 2:30 PM Medical Record Number: [id number] Patient Account Number: [id number] Date of Birth/Sex: Treating RN: 1951/03/21 (72 y.o. [name]) Yevonne Pax Primary Care Provider: SYSTEM, PCP Other Clinician: Referring Provider: Treating Provider/Extender: Jamelle Rushing in Treatment: 6 History of Present Illness HPI Description: ADMISSION 12/04/2022 This is a 72 year old man from the [place] which I gather is an assisted living. He has wounds on the left dorsal foot and at 1 point had 1 in a similar position on the right. However our intake nurse washed off eschar on the right and everything was fully epithelialized here. On the left he has a fairly large open area. There is absolutely no history of this wound the patient is not aware of how this formed and when. It would appear that it is being dressed with Unna paste and an Ace wrap. As mentioned the area on the right is healed. The patient is not a diabetic but he is on dialysis probably secondary to  nephrosclerosis. He has hypertension and a history of CVA coronary artery disease glaucoma and according to our intake nurse multiple myeloma. He has seen vascular surgery but as far as I can see only for shunt access. We could not obtain ABIs in this clinic 01-01-2023 upon evaluation today patient's wounds actually appear to be doing very well. This is the first time of seeing this gentleman he was actually admitted by Dr. Leanord Hawking back on May 24. I am not sure why he has not been here for the past month. With that being said I think there may have been some missed visits that happened in the interim. Fortunately I do not see any evidence of active infection locally nor systemically at this time which is great news. No fevers, chills, nausea, vomiting, or diarrhea. WEILAND, SERRETTE ([id number]) [id number] Page 2 of 6 01-21-2023 upon evaluation today patient appears to be doing well currently in regard to his wound. Has been tolerating the dressing changes without complication. Fortunately there does not appear to be any signs of active infection locally nor systemically which is great news. No fevers, chills, nausea, vomiting, or diarrhea. Electronic Signature(s) Signed: 01/21/2023 2:53:24 PM By: Allen Derry PA-C Entered By: Allen Derry on 01/21/2023 14:53:24 -------------------------------------------------------------------------------- Physical Exam Details Patient Name: Date of Service: Anthony Pugh EL L. 01/21/2023 2:30 PM Medical Record Number: [id number] Patient Account Number: [id number] Date of Birth/Sex: Treating RN: [birth year] (72 y.o. [name]) Yevonne Pax Primary Care Provider: SYSTEM, PCP Other Clinician: Referring Provider: Treating Provider/Extender: Jamelle Rushing in Treatment: 6 Constitutional Well-nourished and well-hydrated in no acute distress. Respiratory normal breathing without difficulty. Psychiatric this patient is  able to make decisions and demonstrates good insight into disease process. Alert and Oriented x 3. pleasant and cooperative. Notes Upon  inspection patient's wound bed actually showed signs of good granulation epithelization at this point. Fortunately I do not see any evidence of infection at this time which is great news in general I think that he is making good progress and in fact appears to be completely healed based on what I am seeing. Electronic Signature(s) Signed: 01/21/2023 2:53:34 PM By: Allen Derry PA-C Entered By: Allen Derry on 01/21/2023 14:53:34 -------------------------------------------------------------------------------- Physician Orders Details Patient Name: Date of Service: Anthony Pugh EL L. 01/21/2023 2:30 PM Medical Record Number: [id number] Patient Account Number: [id number] Date of Birth/Sex: Treating RN: [birth year] (72 y.o. [name]) Yevonne Pax Primary Care Provider: SYSTEM, PCP Other Clinician: Referring Provider: Treating Provider/Extender: Jamelle Rushing in Treatment: 6 Verbal / Phone Orders: No Diagnosis [address] 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, with stage 5 chronic kidney disease, or end stage renal disease Discharge [facility] Discharge from Wound Care Center Treatment Complete - please apply protective dressing to right foot times 2 weeks every other day and as needed then D/C, please apply lotion to bi lat lower legs daily Electronic Signature(s) Signed: 01/21/2023 5:17:09 PM By: Allen Derry PA-C Signed: 02/16/2023 3:48:01 PM By: Yevonne Pax RN Entered By: Yevonne Pax on 01/21/2023  14:47:23 -------------------------------------------------------------------------------- Problem List Details Patient Name: Date of Service: Anthony Houston, Anthony EL L. 01/21/2023 2:30 PM Medical Record Number: [id number] Patient Account Number: [id number] Date of Birth/Sex: Treating RN: 1950/11/17 (72 y.o. 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 01/21/2023 2:30 PM Medical Record Number: [id number] Patient Account Number: [id number] Date of Birth/Sex: Treating RN: 1950-10-13 (72 y.o. [name]) Yevonne Pax Primary Care Provider: SYSTEM, PCP Other Clinician: Referring Provider: Treating Provider/Extender: Allen Derry PIerce, Richardson Landry in Treatment: 6 Subjective Chief Complaint Information obtained from Patient 11/30/2022; patient arrives in clinic for review of an area on the left dorsal foot. History of Present Illness (HPI) ADMISSION 12/04/2022 This is a 72 year old man from the [place] which I gather is an  assisted living. He has wounds on the left dorsal foot and at 1 point had 1 in a similar position on the right. However our intake nurse washed off eschar on the right and everything was fully epithelialized here. On the left he has a fairly large open area. There is absolutely no history of this wound the patient is not aware of how this formed and when. It would appear that it is being dressed with Unna paste and an Ace  wrap. As mentioned the area on the right is healed. The patient is not a diabetic but he is on dialysis probably secondary to nephrosclerosis. He has hypertension and a history of CVA coronary artery disease glaucoma and according to our intake nurse multiple myeloma. He has seen vascular surgery but as far as I can see only for shunt access. We could not obtain ABIs in this clinic 01-01-2023 upon evaluation today patient's wounds actually appear to be doing very well. This is the first time of seeing this gentleman he was actually admitted by Dr. Leanord Hawking back on May 24. I am not sure why he has not been here for the past month. With that being said I think there may have been some missed visits that happened in the interim. Fortunately I do not see any evidence of active infection locally nor systemically at this time which is great news. No fevers, chills, nausea, vomiting, or diarrhea. 01-21-2023 upon evaluation today patient appears to be doing well currently in regard to his wound. Has been tolerating the dressing changes without complication. Fortunately there does not appear to be any signs of active infection locally nor systemically which is great news. No fevers, chills, nausea, vomiting, or diarrhea. Objective Constitutional Well-nourished and well-hydrated in no acute distress. Vitals Time Taken: 2:18 PM, Height: 72 in, Weight: 206 lbs, BMI: 27.9, Temperature: 98.1 F, Pulse: 66 bpm, Respiratory Rate: 16 breaths/min, Blood Pressure: 135/72 mmHg. Respiratory normal  breathing without difficulty. Psychiatric this patient is able to make decisions and demonstrates good insight into disease process. Alert and Oriented x 3. pleasant and cooperative. General Notes: Upon inspection patient's wound bed actually showed signs of good granulation epithelization at this point. Fortunately I do not see any evidence of infection at this time which is great news in general I think that he is making good progress and in fact appears to be completely healed based on what I am seeing. Integumentary (Hair, Skin) Wound #1 status is Healed - Epithelialized. Original cause of wound was Gradually Appeared. The date acquired was: 11/13/2022. The wound has been in treatment 6 weeks. The wound is located on the Left,Dorsal Foot. The wound measures 0cm length x 0cm width x 0cm depth; 0cm^2 area and 0cm^3 volume. There is no tunneling or undermining noted. There is a none present amount of drainage noted. The wound margin is flat and intact. There is no granulation within the wound bed. There is no necrotic tissue within the wound bed. Anthony Houston, Anthony Houston ([id number]) [id number] Page 5 of 6 Wound #2 status is Healed - Epithelialized. Original cause of wound was Gradually Appeared. The date acquired was: 12/28/2022. The wound has been in treatment 2 weeks. The wound is located on the Right,Distal,Medial Lower Leg. The wound measures 0cm length x 0cm width x 0cm depth; 0cm^2 area and 0cm^3 volume. There is no tunneling or undermining noted. There is a none present amount of drainage noted. There is no granulation within the wound bed. There is no necrotic tissue within the wound bed. 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 Based on what I am seeing I do believe the patient is completely healed. 2. I am good recommend a protective dressing over the left anterior foot region. I think that this will be good for the next 2 weeks in order to protect the area. 3. Also can recommend that he should continue to monitor rather be monitored for any signs of worsening infection if anything changes they should contact the office and let me know. We will see patient back for reevaluation in 1 week here in the clinic. If anything worsens or changes patient will contact our office for additional recommendations. Electronic Signature(s) Signed: 01/21/2023 2:54:28 PM By: Allen Derry PA-C Entered By: Allen Derry on 01/21/2023 14:54:28 -------------------------------------------------------------------------------- SuperBill Details Patient Name: Date of Service: Anthony Pugh EL L. 01/21/2023 Medical Record Number: [id number] Patient Account Number: [id number] Date of Birth/Sex: Treating RN: 1950/09/20 (72 y.o. 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, with stage 5 chronic kidney disease, or end stage renal disease Facility Procedures : Anthony Houston Code: [id number] , Anthony Houston ([id number]) Description: [phone]  - WOUND CARE VISIT-LEV 2 EST PT [phone] Modifier: [phone].pdf Page Quantity: 1 6 of 6 Physician Procedures : CPT4 Code Description Modifier [phone] [id number] - WC PHYS LEVEL 3 - EST PT ICD-10 Diagnosis Description L97.528 Non-pressure chronic ulcer of other part of left foot with other specified severity I70.245 Atherosclerosis of native arteries of left leg with  ulceration of other part of foot I13.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

## 2023-01-21 NOTE — Progress Notes (Addendum)
KEMAURY, QUANCE ([id number]) [id number] Page 1 of 9 Visit Report for 01/21/2023 Arrival Information Details Patient Name: Date of Service: Anthony Pugh EL [place]. 01/21/2023 2:30 PM Medical Record Number: [id number] Patient Account Number: [id number] Date of Birth/Sex: Treating RN: [birth year] (72 y.o. [name]) Yevonne Pax Primary Care : SYSTEM, PCP Other Clinician: Referring : Treating /Extender: Jamelle Rushing in Treatment: 6 Visit Information History Since Last Visit Added or deleted any medications: No Patient Arrived: Wheel Chair Any new allergies or adverse reactions: No Arrival Time: 14:14 Had Anthony fall or experienced change in No Accompanied By: self activities of daily living that may affect Transfer Assistance: None risk of falls: Patient Identification Verified: Yes Signs or symptoms of abuse/neglect since last visito No Secondary Verification Process Completed: Yes Hospitalized since last visit: No Patient Requires Transmission-Based Precautions: No Implantable device outside of the clinic excluding No Patient Has Alerts: Yes cellular tissue based products placed in the center Patient Alerts: Patient on Blood Thinner since last visit: 81mg  aspirin Has Dressing in Place as Prescribed: Yes Not DIAbetic Pain Present Now: No Dialysis patient ABI R1.03 TBI 1.22 6/24 ABI L1.22 TBI 1.14 6/24 Electronic Signature(s) Signed: 02/16/2023 3:48:01 PM By: Yevonne Pax RN Entered By: Yevonne Pax on 01/21/2023 14:17:59 -------------------------------------------------------------------------------- Clinic Level of Care Assessment Details Patient Name: Date of Service: Anthony Pugh EL [place]. 01/21/2023 2:30 PM Medical Record Number: [id number] Patient Account Number: [id number] Date of Birth/Sex: Treating RN: [birth year] (72 y.o. [name]) Yevonne Pax Primary Care : SYSTEM, PCP Other Clinician: Referring  : Treating /Extender: Jamelle Rushing in Treatment: 6 Clinic Level of Care Assessment Items TOOL 4 Quantity Score X- 1 0 Use when only an EandM is performed on FOLLOW-UP visit ASSESSMENTS - Nursing Assessment / Reassessment X- 1 10 Reassessment of Co-morbidities (includes updates in patient status) X- 1 5 Reassessment of Adherence to Treatment Plan Anthony [place], Anthony [place] ([id number]) [id number] Page 2 of 9 ASSESSMENTS - Wound and Skin Anthony ssessment / Reassessment []  - 0 Simple Wound Assessment / Reassessment - one wound X- 2 5 Complex Wound Assessment / Reassessment - multiple wounds []  - 0 Dermatologic / Skin Assessment (not related to wound area) ASSESSMENTS - Focused Assessment []  - 0 Circumferential Edema Measurements - multi extremities []  - 0 Nutritional Assessment / Counseling / Intervention []  - 0 Lower Extremity Assessment (monofilament, tuning fork, pulses) []  - 0 Peripheral Arterial Disease Assessment (using hand held doppler) ASSESSMENTS - Ostomy and/or Continence Assessment and Care []  - 0 Incontinence Assessment and Management []  - 0 Ostomy Care Assessment and Management (repouching, etc.) PROCESS - Coordination of Care X - Simple Patient / Family Education for ongoing care 1 15 []  - 0 Complex (extensive) Patient / Family Education for ongoing care []  - 0 Staff obtains Chiropractor, Records, T Results / Process Orders est []  - 0 Staff telephones HHA, Nursing Homes / Clarify orders / etc []  - 0 Routine Transfer to another Facility (non-emergent condition) []  - 0 Routine Hospital Admission (non-emergent condition) []  - 0 New Admissions / Manufacturing engineer / Ordering NPWT Apligraf, etc. , []  - 0 Emergency Hospital Admission (emergent condition) X- 1 10 Simple Discharge Coordination []  - 0 Complex (extensive) Discharge Coordination PROCESS - Special Needs []  - 0 Pediatric / Minor Patient  Management []  - 0 Isolation Patient Management []  - 0 Hearing / Language / Visual special needs []  - 0 Assessment of Community assistance (transportation, D/C planning, etc.) []  - 0 Additional  assistance / Altered mentation []  - 0 Support Surface(s) Assessment (bed, cushion, seat, etc.) INTERVENTIONS - Wound Cleansing / Measurement []  - 0 Simple Wound Cleansing - one wound []  - 0 Complex Wound Cleansing - multiple wounds X- 1 5 Wound Imaging (photographs - any number of wounds) []  - 0 Wound Tracing (instead of photographs) []  - 0 Simple Wound Measurement - one wound []  - 0 Complex Wound Measurement - multiple wounds INTERVENTIONS - Wound Dressings []  - 0 Small Wound Dressing one or multiple wounds []  - 0 Medium Wound Dressing one or multiple wounds []  - 0 Large Wound Dressing one or multiple wounds []  - 0 Application of Medications - topical []  - 0 Application of Medications - injection INTERVENTIONS - Miscellaneous []  - 0 External ear exam Anthony [place], Anthony [place] ([id number]) [id number] Page 3 of 9 []  - 0 Specimen Collection (cultures, biopsies, blood, body fluids, etc.) []  - 0 Specimen(s) / Culture(s) sent or taken to Lab for analysis []  - 0 Patient Transfer (multiple staff / Michiel Sites Lift / Similar devices) []  - 0 Simple Staple / Suture removal (25 or less) []  - 0 Complex Staple / Suture removal (26 or more) []  - 0 Hypo / Hyperglycemic Management (close monitor of Blood Glucose) []  - 0 Ankle / Brachial Index (ABI) - do not check if billed separately X- 1 5 Vital Signs Has the patient been seen at the hospital within the last three years: Yes Total Score: 60 Level Of Care: New/Established - Level 2 Electronic Signature(s) Signed: 02/16/2023 3:48:01 PM By: Yevonne Pax RN Entered By: Yevonne Pax on 01/21/2023 14:47:49 -------------------------------------------------------------------------------- Encounter Discharge Information  Details Patient Name: Date of Service: Anthony Pugh EL [place]. 01/21/2023 2:30 PM Medical Record Number: [id number] Patient Account Number: [id number] Date of Birth/Sex: Treating RN: 03/15/1951 (72 y.o. Melonie [place] Primary Care : SYSTEM, PCP Other Clinician: Referring : Treating /Extender: Jamelle Rushing in Treatment: 6 Encounter Discharge Information Items Discharge Condition: Stable Ambulatory Status: Ambulatory Discharge Destination: Home Transportation: Private Auto Accompanied By: self Schedule Follow-up Appointment: Yes Clinical Summary of Care: Electronic Signature(s) Signed: 02/16/2023 3:48:01 PM By: Yevonne Pax RN Entered By: Yevonne Pax on 01/21/2023 14:49:51 Lower Extremity Assessment Details -------------------------------------------------------------------------------- Anthony [place] ([id number]) [id number] Page 4 of 9 Patient Name: Date of Service: Anthony Pugh EL [place]. 01/21/2023 2:30 PM Medical Record Number: [id number] Patient Account Number: [id number] Date of Birth/Sex: Treating RN: [birth year] (72 y.o. [name]) Yevonne Pax Primary Care : SYSTEM, PCP Other Clinician: Referring : Treating /Extender: Jamelle Rushing in Treatment: 6 Edema Assessment Left: Right: Assessed: No No Edema: Yes Calf Left: Right: Point of Measurement: 33 cm From Medial Instep 34 cm Ankle Left: Right: Point of Measurement: 10 cm From Medial Instep 24 cm Electronic Signature(s) Signed: 02/16/2023 3:48:01 PM By: Yevonne Pax RN Entered By: Yevonne Pax on 01/21/2023 14:46:05 -------------------------------------------------------------------------------- Multi Wound Chart Details Patient Name: Date of Service: Anthony Pugh EL [place]. 01/21/2023 2:30 PM Medical Record Number: [id number] Patient Account Number: [id number] Date of Birth/Sex: Treating RN: 06/17/1951 (72  y.o. [name]) Yevonne Pax Primary Care : SYSTEM, PCP Other Clinician: Referring : Treating /Extender: Jamelle Rushing in Treatment: 6 Vital Signs Height(in): 72 Pulse(bpm): 66 Weight(lbs): 206 Blood Pressure(mmHg): 135/72 Body Mass Index(BMI): 27.9 Temperature(F): 98.1 Respiratory Rate(breaths/min): 16 [1:Photos:] [N/Anthony:N/Anthony] Left, Dorsal Foot Right, Distal, Medial Lower Leg N/Anthony Wound Location: Gradually Appeared Gradually Appeared N/Anthony Wounding Event: Venous Leg Ulcer Venous Leg Ulcer N/Anthony  Primary Etiology: Glaucoma, Coronary Artery Disease, Glaucoma, Coronary Artery Disease, N/Anthony Comorbid History: Hypertension, End Stage Renal Hypertension, End Stage Renal Disease, Dementia Disease, Dementia 11/13/2022 12/28/2022 N/Anthony Date Acquired: 6 2 N/Anthony Weeks of Treatment: Healed - Epithelialized Healed - Epithelialized N/Anthony Wound Status: Anthony [place], Anthony [place] ([id number]) [id number] Page 5 of 9 No No N/Anthony Wound Recurrence: 0x0x0 0x0x0 N/Anthony Measurements [place] x W x D (cm) 0 0 N/Anthony Anthony (cm) : rea 0 0 N/Anthony Volume (cm) : 100.00% 100.00% N/Anthony % Reduction in Area: 100.00% 100.00% N/Anthony % Reduction in Volume: Full Thickness Without Exposed Full Thickness Without Exposed N/Anthony Classification: Support Structures Support Structures None Present None Present N/Anthony Exudate Amount: Flat and Intact N/Anthony N/Anthony Wound Margin: None Present (0%) None Present (0%) N/Anthony Granulation Amount: None Present (0%) None Present (0%) N/Anthony Necrotic Amount: Fascia: No Fascia: No N/Anthony Exposed Structures: Fat Layer (Subcutaneous Tissue): No Fat Layer (Subcutaneous Tissue): No Tendon: No Tendon: No Muscle: No Muscle: No Joint: No Joint: No Bone: No Bone: No Large (67-100%) Large (67-100%) N/Anthony Epithelialization: Treatment Notes Electronic Signature(s) Signed: 02/16/2023 3:48:01 PM By: Yevonne Pax RN Entered By: Yevonne Pax on 01/21/2023  14:46:10 -------------------------------------------------------------------------------- Multi-Disciplinary Care Plan Details Patient Name: Date of Service: Anthony Pugh EL [place]. 01/21/2023 2:30 PM Medical Record Number: [id number] Patient Account Number: [id number] Date of Birth/Sex: Treating RN: [birth year] (72 y.o. Melonie [place] Primary Care : SYSTEM, PCP Other Clinician: Referring : Treating /Extender: Jamelle Rushing in Treatment: 6 Active Inactive Electronic Signature(s) Signed: 02/16/2023 3:48:01 PM By: Yevonne Pax RN Entered By: Yevonne Pax on 01/21/2023 14:48:12 -------------------------------------------------------------------------------- Pain Assessment Details Patient Name: Date of Service: Anthony Pugh EL [place]. 01/21/2023 2:30 PM Medical Record Number: [id number] Patient Account Number: [id number] Date of Birth/Sex: Treating RN: 1951/01/01 (72 y.o. [name]) [place], [place], [place] ([id number]) [phone].pdf Page 6 of 9 Primary Care : SYSTEM, PCP Other Clinician: Referring : Treating /Extender: Jamelle Rushing in Treatment: 6 Active Problems Location of Pain Severity and Description of Pain Patient Has Paino No Site Locations Pain Management and Medication Current Pain Management: Electronic Signature(s) Signed: 02/16/2023 3:48:01 PM By: Yevonne Pax RN Entered By: Yevonne Pax on 01/21/2023 14:19:22 -------------------------------------------------------------------------------- Patient/Caregiver Education Details Patient Name: Date of Service: Anthony [place] 7/11/2024andnbsp2:30 PM Medical Record Number: [id number] Patient Account Number: [id number] Date of Birth/Gender: Treating RN: 1950/12/23 (72 y.o. [name]) Yevonne Pax Primary Care Physician: SYSTEM, PCP Other Clinician: Referring Physician: Treating Physician/Extender: Jamelle Rushing in Treatment: 6 Education Assessment Education Provided To: Patient Education Topics Provided Wound/Skin Impairment: Handouts: Caring for Your Ulcer, Other: discharge instructions Methods: Explain/Verbal Responses: State content correctly Electronic Signature(s) Signed: 02/16/2023 3:48:01 PM By: Yevonne Pax RN Anthony [place], Anthony [place] ([id number]) [id number] Page 7 of 9 Entered By: Yevonne Pax on 01/21/2023 14:48:39 -------------------------------------------------------------------------------- Wound Assessment Details Patient Name: Date of Service: Anthony Marc Morgans EL [place]. 01/21/2023 2:30 PM Medical Record Number: [id number] Patient Account Number: [id number] Date of Birth/Sex: Treating RN: June 03, 1951 (72 y.o. [name]) Yevonne Pax Primary Care : SYSTEM, PCP Other Clinician: Referring : Treating /Extender: Jamelle Rushing in Treatment: 6 Wound Status Wound Number: 1 Primary Venous Leg Ulcer Etiology: Wound Location: Left, Dorsal Foot Wound Healed - Epithelialized Wounding Event: Gradually Appeared Status: Date Acquired: 11/13/2022 Comorbid Glaucoma, Coronary Artery Disease, Hypertension, End Stage Weeks Of Treatment: 6 History: Renal Disease, Dementia Clustered Wound: No Photos Wound Measurements Length: (cm) Width: (cm) Depth: (cm)  Area: (cm) Volume: (cm) 0 % Reduction in Area: 100% 0 % Reduction in Volume: 100% 0 Epithelialization: Large (67-100%) 0 Tunneling: No 0 Undermining: No Wound Description Classification: Full Thickness Without Exposed Support Wound Margin: Flat and Intact Exudate Amount: None Present Structures Foul Odor After Cleansing: No Slough/Fibrino No Wound Bed Granulation Amount: None Present (0%) Exposed Structure Necrotic Amount: None Present (0%) Fascia Exposed: No Fat Layer (Subcutaneous Tissue) Exposed: No Tendon Exposed: No Muscle Exposed:  No Joint Exposed: No Bone Exposed: No Treatment Notes Wound #1 (Foot) Wound Laterality: Dorsal, Left Cleanser Peri-Wound Care Anthony [place], Anthony [place] ([id number]) [id number] Page 8 of 9 Topical Primary Dressing Secondary Dressing Secured With Compression Wrap Compression Stockings Add-Ons Electronic Signature(s) Signed: 02/16/2023 3:48:01 PM By: Yevonne Pax RN Entered By: Yevonne Pax on 01/21/2023 14:45:10 -------------------------------------------------------------------------------- Wound Assessment Details Patient Name: Date of Service: Anthony Pugh EL [place]. 01/21/2023 2:30 PM Medical Record Number: [id number] Patient Account Number: [id number] Date of Birth/Sex: Treating RN: [birth year] (72 y.o. [name]) Yevonne Pax Primary Care : SYSTEM, PCP Other Clinician: Referring : Treating /Extender: Jamelle Rushing in Treatment: 6 Wound Status Wound Number: 2 Primary Venous Leg Ulcer Etiology: Wound Location: Right, Distal, Medial Lower Leg Wound Healed - Epithelialized Wounding Event: Gradually Appeared Status: Date Acquired: 12/28/2022 Comorbid Glaucoma, Coronary Artery Disease, Hypertension, End Stage Weeks Of Treatment: 2 History: Renal Disease, Dementia Clustered Wound: No Photos Wound Measurements Length: (cm) Width: (cm) Depth: (cm) Area: (cm) Volume: (cm) 0 % Reduction in Area: 100% 0 % Reduction in Volume: 100% 0 Epithelialization: Large (67-100%) 0 Tunneling: No 0 Undermining: No Wound Description Classification: Full Thickness Without Exposed Support Exudate Amount: None Present Structures Foul Odor After Cleansing: No Slough/Fibrino No Wound Bed Anthony [place], Anthony [place] ([id number]) [id number] Page 9 of 9 Granulation Amount: None Present (0%) Exposed Structure Necrotic Amount: None Present (0%) Fascia Exposed: No Fat Layer (Subcutaneous Tissue) Exposed: No Tendon  Exposed: No Muscle Exposed: No Joint Exposed: No Bone Exposed: No Treatment Notes Wound #2 (Lower Leg) Wound Laterality: Right, Medial, Distal Cleanser Peri-Wound Care Topical Primary Dressing Secondary Dressing Secured With Compression Wrap Compression Stockings Add-Ons Electronic Signature(s) Signed: 02/16/2023 3:48:01 PM By: Yevonne Pax RN Entered By: Yevonne Pax on 01/21/2023 14:45:41 -------------------------------------------------------------------------------- Vitals Details Patient Name: Date of Service: Anthony [place], Anthony EL [place]. 01/21/2023 2:30 PM Medical Record Number: [id number] Patient Account Number: [id number] Date of Birth/Sex: Treating RN: August 19, 1950 (72 y.o. [name]) Yevonne Pax Primary Care : SYSTEM, PCP Other Clinician: Referring : Treating /Extender: Jamelle Rushing in Treatment: 6 Vital Signs Time Taken: 14:18 Temperature (F): 98.1 Height (in): 72 Pulse (bpm): 66 Weight (lbs): 206 Respiratory Rate (breaths/min): 16 Body Mass Index (BMI): 27.9 Blood Pressure (mmHg): 135/72 Reference Range: 80 - 120 mg / dl Electronic Signature(s) Signed: 02/16/2023 3:48:01 PM By: Yevonne Pax RN Entered By: Yevonne Pax on 01/21/2023 14:19:12

## 2023-07-05 ENCOUNTER — Emergency Department: Payer: Medicare Other

## 2023-07-05 ENCOUNTER — Inpatient Hospital Stay
Admission: EM | Admit: 2023-07-05 | Discharge: 2023-07-08 | DRG: 871 | Disposition: A | Discharge status: Home Health | Payer: Medicare Other | Source: Skilled Nursing Facility | Attending: Hospitalist | Admitting: Hospitalist

## 2023-07-05 ENCOUNTER — Other Ambulatory Visit: Payer: Self-pay

## 2023-07-05 DIAGNOSIS — R1110 Vomiting, unspecified: Secondary | ICD-10-CM

## 2023-07-05 DIAGNOSIS — A419 Sepsis, unspecified organism: Principal | ICD-10-CM | POA: Diagnosis present

## 2023-07-05 DIAGNOSIS — I700 Atherosclerosis of aorta: Secondary | ICD-10-CM | POA: Diagnosis present

## 2023-07-05 DIAGNOSIS — B181 Chronic viral hepatitis B without delta-agent: Secondary | ICD-10-CM | POA: Diagnosis present

## 2023-07-05 DIAGNOSIS — N186 End stage renal disease: Secondary | ICD-10-CM | POA: Diagnosis present

## 2023-07-05 DIAGNOSIS — A4189 Other specified sepsis: Principal | ICD-10-CM | POA: Diagnosis present

## 2023-07-05 DIAGNOSIS — N2581 Secondary hyperparathyroidism of renal origin: Secondary | ICD-10-CM | POA: Diagnosis present

## 2023-07-05 DIAGNOSIS — R112 Nausea with vomiting, unspecified: Secondary | ICD-10-CM | POA: Diagnosis not present

## 2023-07-05 DIAGNOSIS — R911 Solitary pulmonary nodule: Secondary | ICD-10-CM | POA: Diagnosis present

## 2023-07-05 DIAGNOSIS — B1920 Unspecified viral hepatitis C without hepatic coma: Secondary | ICD-10-CM | POA: Diagnosis present

## 2023-07-05 DIAGNOSIS — Z85831 Personal history of malignant neoplasm of soft tissue: Secondary | ICD-10-CM

## 2023-07-05 DIAGNOSIS — J9811 Atelectasis: Secondary | ICD-10-CM | POA: Diagnosis present

## 2023-07-05 DIAGNOSIS — B348 Other viral infections of unspecified site: Secondary | ICD-10-CM | POA: Diagnosis present

## 2023-07-05 DIAGNOSIS — C9000 Multiple myeloma not having achieved remission: Secondary | ICD-10-CM | POA: Diagnosis present

## 2023-07-05 DIAGNOSIS — Z888 Allergy status to other drugs, medicaments and biological substances status: Secondary | ICD-10-CM

## 2023-07-05 DIAGNOSIS — Z7982 Long term (current) use of aspirin: Secondary | ICD-10-CM

## 2023-07-05 DIAGNOSIS — I120 Hypertensive chronic kidney disease with stage 5 chronic kidney disease or end stage renal disease: Secondary | ICD-10-CM | POA: Diagnosis present

## 2023-07-05 DIAGNOSIS — D631 Anemia in chronic kidney disease: Secondary | ICD-10-CM | POA: Diagnosis present

## 2023-07-05 DIAGNOSIS — Z79899 Other long term (current) drug therapy: Secondary | ICD-10-CM

## 2023-07-05 DIAGNOSIS — R6520 Severe sepsis without septic shock: Secondary | ICD-10-CM | POA: Diagnosis present

## 2023-07-05 DIAGNOSIS — F0390 Unspecified dementia without behavioral disturbance: Secondary | ICD-10-CM | POA: Diagnosis present

## 2023-07-05 DIAGNOSIS — Z1152 Encounter for screening for COVID-19: Secondary | ICD-10-CM

## 2023-07-05 DIAGNOSIS — Z992 Dependence on renal dialysis: Secondary | ICD-10-CM

## 2023-07-05 DIAGNOSIS — I2510 Atherosclerotic heart disease of native coronary artery without angina pectoris: Secondary | ICD-10-CM | POA: Diagnosis present

## 2023-07-05 DIAGNOSIS — G9341 Metabolic encephalopathy: Secondary | ICD-10-CM

## 2023-07-05 DIAGNOSIS — I10 Essential (primary) hypertension: Secondary | ICD-10-CM | POA: Diagnosis present

## 2023-07-05 DIAGNOSIS — E872 Acidosis, unspecified: Secondary | ICD-10-CM | POA: Diagnosis present

## 2023-07-05 MED ORDER — VANCOMYCIN HCL IN DEXTROSE 1-5 GM/200ML-% IV SOLN
1000.0000 mg | Freq: Once | INTRAVENOUS | Status: AC
  Administered 2023-07-06: 1000 mg via INTRAVENOUS
  Filled 2023-07-05: qty 200

## 2023-07-05 MED ORDER — ONDANSETRON HCL 4 MG/2ML IJ SOLN
4.0000 mg | Freq: Once | INTRAMUSCULAR | Status: AC
  Administered 2023-07-05: 4 mg via INTRAVENOUS
  Filled 2023-07-05: qty 2

## 2023-07-05 MED ORDER — ACETAMINOPHEN 325 MG RE SUPP
650.0000 mg | Freq: Once | RECTAL | Status: AC
  Administered 2023-07-05: 650 mg via RECTAL
  Filled 2023-07-05: qty 2

## 2023-07-05 MED ORDER — METRONIDAZOLE 500 MG/100ML IV SOLN
500.0000 mg | Freq: Once | INTRAVENOUS | Status: AC
  Administered 2023-07-06: 500 mg via INTRAVENOUS
  Filled 2023-07-05: qty 100

## 2023-07-05 MED ORDER — LACTATED RINGERS IV BOLUS (SEPSIS)
500.0000 mL | Freq: Once | INTRAVENOUS | Status: AC
  Administered 2023-07-06: 500 mL via INTRAVENOUS

## 2023-07-05 MED ORDER — SODIUM CHLORIDE 0.9 % IV SOLN
2.0000 g | Freq: Once | INTRAVENOUS | Status: AC
  Administered 2023-07-05: 2 g via INTRAVENOUS
  Filled 2023-07-05: qty 12.5

## 2023-07-05 MED ORDER — LACTATED RINGERS IV SOLN: INTRAVENOUS | Status: DC

## 2023-07-05 NOTE — ED Provider Notes (Incomplete)
[facility] Provider Note    Event Date/Time   First MD Initiated Contact with Patient 07/05/23 2320     (approximate)   History   Weakness and Emesis   HPI  Anthony Houston is a 72 y.o. male with history of CAD, end-stage renal disease on hemodialysis, hypertension, gastric stromal tumor, multiple myeloma, dementia, chronic hepatitis B and hepatitis C who presents to the emergency department EMS from his nursing facility for vomiting, diarrhea and altered mental status.  Nursing staff at facility, EMS unable to tell us what patient's baseline is.  Patient is awake and alert and able to tell us who he is but not what is going on, where he is or what year it is.  Unclear if this is his baseline.  Unclear if there has been any head injury or falls.  He is not on blood thinners.  Febrile here.  Patient denies any pain but is an unreliable historian.   History provided by EMS, level 5 caveat secondary to dementia.    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   Glaucoma    Hypertension    Knee pain, left    Multiple myeloma (HCC)     Past Surgical History:  Procedure Laterality Date   ABDOMINAL SURGERY  w/in 10 years   to remove cancer    DIALYSIS/PERMA CATHETER INSERTION N/A 07/26/2018   Procedure: DIALYSIS/PERMA CATHETER INSERTION;  Surgeon: Renford Dills, MD;  Location: [facility] INVASIVE CV LAB;  Service: Cardiovascular;  Laterality: N/A;   DIALYSIS/PERMA CATHETER INSERTION N/A 05/05/2019   Procedure: DIALYSIS/PERMA CATHETER EXCHANGE;  Surgeon: Renford Dills, MD;  Location: [facility] INVASIVE CV LAB;  Service: Cardiovascular;  Laterality: N/A;   NO PAST SURGERIES     PERIPHERAL VASCULAR THROMBECTOMY Left 05/05/2019   Procedure: PERIPHERAL VASCULAR THROMBECTOMY;  Surgeon: Renford Dills, MD;  Location: [facility] INVASIVE CV LAB;  Service: Cardiovascular;  Laterality: Left;    MEDICATIONS:  Prior to Admission medications   Medication Sig Start Date End Date Taking? Authorizing Provider  acetaminophen (TYLENOL) 325 MG suppository Place 650 mg rectally every 4 (four) hours as needed for fever or mild pain.    [provider]  aspirin 81 MG chewable tablet Chew 81 mg by mouth daily.    [provider]  brimonidine-timolol (COMBIGAN) 0.2-0.5 % ophthalmic solution Place 1 drop into both eyes 2 (two) times daily.    [provider]  carvedilol (COREG) 12.5 MG tablet Take 12.5 mg by mouth at bedtime.    [provider]  diclofenac sodium (VOLTAREN) 1 % GEL Apply 4 g topically 4 (four) times daily.    [provider]  donepezil (ARICEPT) 10 MG tablet Take 10 mg by mouth at bedtime.    [provider]  entecavir (BARACLUDE) 0.5 MG tablet Take 0.5 mg by mouth every 3 (three) days.    [provider]  loperamide (IMODIUM A-D) 2 MG tablet Take 4 mg by mouth 3 (three) times daily as needed for diarrhea or loose stools.    [provider]  LUMIGAN 0.01 % SOLN Place 1 drop into both eyes at bedtime. 11/24/22   [provider]  magnesium oxide (MAG-OX) 400 (240 Mg) MG tablet Take 400 mg by mouth daily.  [provider]  memantine (NAMENDA) 5 MG tablet Take 5 mg by mouth 2 (two) times daily. 12/02/22   [provider]  Oyster Shell 500 MG TABS Take 1,000 mg by mouth daily.    [provider]  pantoprazole (PROTONIX) 40 MG tablet Take 40 mg by mouth daily.    [provider]  RHOPRESSA 0.02 % SOLN Place 1 drop into both eyes at bedtime. 10/27/22   [provider]  simvastatin (ZOCOR) 10 MG tablet Take 10 mg by mouth at bedtime.    [provider]  Skin Protectants, Misc. (MINERIN) CREA Apply 1 application topically 2 (two) times daily.    [provider]   torsemide (DEMADEX) 20 MG tablet Take 80 mg by mouth daily.    [provider]    Physical Exam   Triage Vital Signs: ED Triage Vitals  Encounter Vitals Group     BP --      Systolic BP Percentile --      Diastolic BP Percentile --      Pulse Rate 07/05/23 2327 (!) 102     Resp 07/05/23 2327 19     Temp 07/05/23 2331 (!) 103.1 F (39.5 C)     Temp Source 07/05/23 2331 Oral     SpO2 07/05/23 2327 92 %     Weight --      Height --      Head Circumference --      Peak Flow --      Pain Score 07/05/23 2328 0     Pain Loc --      Pain Education --      Exclude from Growth Chart --     Most recent vital signs: Vitals:   07/06/23 0156 07/06/23 0200  BP:  113/67  Pulse:  87  Resp:  19  Temp: (!) 101.4 F (38.6 C)   SpO2:  94%    CONSTITUTIONAL: Alert, oriented to person but not place, year, situation.  Elderly.  Febrile but nontoxic. HEAD: Normocephalic, atraumatic EYES: Conjunctivae clear, pupils appear equal, sclera nonicteric ENT: normal nose; moist mucous membranes NECK: Supple, normal ROM CARD: RRR; S1 and S2 appreciated RESP: Normal chest excursion without splinting or tachypnea; breath sounds clear and equal bilaterally; no wheezes, no rhonchi, no rales, no hypoxia or respiratory distress, speaking full sentences ABD/GI: Non-distended; soft, non-tender, no rebound, no guarding, no peritoneal signs BACK: The back appears normal EXT: Normal ROM in all joints; no deformity noted, mild chronic appearing symmetric edema noted in bilateral lower extremities, fistula in the left upper extremity with normal thrill and bruit with no overlying redness, warmth or tenderness SKIN: Normal color for age and race; warm; no rash on exposed skin NEURO: Moves all extremities equally, normal speech, no facial asymmetry PSYCH: The patient's mood and manner are appropriate.   ED Results / Procedures / Treatments   LABS: (all labs ordered are listed, but only abnormal  results are displayed) Labs Reviewed  LACTIC ACID, PLASMA - Abnormal; Notable for the following components:      Result Value   Lactic Acid, Venous 2.7 (*)    All other components within normal limits  COMPREHENSIVE METABOLIC PANEL - Abnormal; Notable for the following components:   Chloride 95 (*)    Glucose, Bld 145 (*)    BUN 32 (*)    Creatinine, Ser 6.39 (*)    Total Protein 8.7 (*)    GFR, Estimated 9 (*)  All other components within normal limits  CBC WITH DIFFERENTIAL/PLATELET - Abnormal; Notable for the following components:   Hemoglobin 12.6 (*)    RDW 16.2 (*)    Neutro Abs 8.5 (*)    Lymphs Abs 0.5 (*)    All other components within normal limits  PROTIME-INR - Abnormal; Notable for the following components:   Prothrombin Time 16.0 (*)    INR 1.3 (*)    All other components within normal limits  TROPONIN I (HIGH SENSITIVITY) - Abnormal; Notable for the following components:   Troponin I (High Sensitivity) 31 (*)    All other components within normal limits  RESP PANEL BY RT-PCR (RSV, FLU A&B, COVID)  RVPGX2  CULTURE, BLOOD (ROUTINE X 2)  CULTURE, BLOOD (ROUTINE X 2)  APTT  LIPASE, BLOOD  PROCALCITONIN  LACTIC ACID, PLASMA  URINALYSIS, W/ REFLEX TO CULTURE (INFECTION SUSPECTED)  TROPONIN I (HIGH SENSITIVITY)     EKG:  EKG Interpretation Date/Time:  Monday July 05 2023 23:47:00 EST Ventricular Rate:  96 PR Interval:  148 QRS Duration:  87 QT Interval:  344 QTC Calculation: 435 R Axis:   -48  Text Interpretation: Sinus rhythm Left anterior fascicular block Abnormal R-wave progression, early transition Confirmed by Rochele Raring [phone]) on 07/06/2023 12:26:21 AM         RADIOLOGY: My personal review and interpretation of imaging: CT head negative.  Chest x-ray clear.  CT of the abdomen pelvis shows possible cystitis.  I have personally reviewed all radiology reports.   CT ABDOMEN PELVIS W CONTRAST Result Date: 07/06/2023 CLINICAL DATA:   Abdominal pain, fever, weakness, vomiting EXAM: CT ABDOMEN AND PELVIS WITH CONTRAST TECHNIQUE: Multidetector CT imaging of the abdomen and pelvis was performed using the standard protocol following bolus administration of intravenous contrast. RADIATION DOSE REDUCTION: This exam was performed according to the departmental dose-optimization program which includes automated exposure control, adjustment of the mA and/or kV according to patient size and/or use of iterative reconstruction technique. CONTRAST:  OMNIPAQUE IOHEXOL 300 MG/ML  SOLN COMPARISON:  Chest CT 12/17/2022 FINDINGS: Lower chest: Cavitary lesion in the lingula again noted, partially visualized on the 1st image of today's study. This measures approximately 1.8 cm. When measured in the same planes and at the same level on prior study, this is stable. Ground-glass opacities in the lung bases could reflect atelectasis or scarring. Hepatobiliary: No focal hepatic abnormality. Gallbladder unremarkable. Pancreas: No focal abnormality or ductal dilatation. Spleen: No focal abnormality.  Normal size. Adrenals/Urinary Tract: Extensive bilateral renal cysts compatible with polycystic kidney disease. No stones or hydronephrosis. Urinary bladder is decompressed but appears thick walled. Adrenal glands unremarkable. Stomach/Bowel: Respiratory motion limits evaluation of the bowel. No bowel obstruction. No visible wall thickening. Vascular/Lymphatic: Aortic atherosclerosis. No evidence of aneurysm or adenopathy. Reproductive: No visible focal abnormality. Other: No free fluid or free air. Musculoskeletal: No acute bony abnormality. IMPRESSION: 1. Extensive bilateral renal cysts compatible with polycystic kidney disease. 2. Urinary bladder decompressed but appears thick walled. Recommend correlation with urinalysis to exclude cystitis. 3. Aortic atherosclerosis. 4. Cavitary lesion in the lingula again noted, partially visualized on the 1st image of today's  study. This is stable when measured in the same planes and at the same level on prior study. This could be infectious/inflammatory or neoplastic. Recommend follow-up imaging as described on prior chest CT. Electronically Signed   By: Charlett Nose M.D.   On: 07/06/2023 01:45   DG Chest Port 1 View Result Date: 07/06/2023 CLINICAL DATA:  Questionable sepsis - evaluate for abnormality. Weakness, vomiting. EXAM: PORTABLE CHEST 1 VIEW COMPARISON:  12/17/2022 FINDINGS: Linear densities at the lung bases, likely atelectasis. Heart and mediastinal contours within normal limits. No effusions or acute bony abnormality. IMPRESSION: Bibasilar atelectasis. Electronically Signed   By: Charlett Nose M.D.   On: 07/06/2023 01:42   CT HEAD WO CONTRAST ( ) Result Date: 07/06/2023 CLINICAL DATA:  Altered mental status, weakness, vomiting EXAM: CT HEAD WITHOUT CONTRAST TECHNIQUE: Contiguous axial images were obtained from the base of the skull through the vertex without intravenous contrast. RADIATION DOSE REDUCTION: This exam was performed according to the departmental dose-optimization program which includes automated exposure control, adjustment of the mA and/or kV according to patient size and/or use of iterative reconstruction technique. COMPARISON:  07/25/2018 FINDINGS: Brain: No evidence of acute infarction, hemorrhage, mass, mass effect, or midline shift. No hydrocephalus or extra-axial fluid collection. Advanced cerebral atrophy for age, with ex vacuo dilatation of the ventricles. Commensurate size of the ventricles and sulci. Periventricular white matter changes, likely the sequela of chronic small vessel ischemic disease. Remote lacunar infarct in the left basal ganglia. Vascular: No hyperdense vessel. Skull: Negative for fracture or focal lesion. Sinuses/Orbits: Mucosal thickening in the left maxillary sinus and ethmoid air cells. No acute finding in the orbits. Other: The mastoid air cells are well aerated.  IMPRESSION: No acute intracranial process. Electronically Signed   By: Wiliam Ke M.D.   On: 07/06/2023 01:41     PROCEDURES:  Critical Care performed: Yes, see critical care procedure note(s)   CRITICAL CARE Performed by: Baxter Hire Alden Bensinger   Total critical care time: 40 minutes  Critical care time was exclusive of separately billable procedures and treating other patients.  Critical care was necessary to treat or prevent imminent or life-threatening deterioration.  Critical care was time spent personally by me on the following activities: development of treatment plan with patient and/or surrogate as well as nursing, discussions with consultants, evaluation of patient's response to treatment, examination of patient, obtaining history from patient or surrogate, ordering and performing treatments and interventions, ordering and review of laboratory studies, ordering and review of radiographic studies, pulse oximetry and re-evaluation of patient's condition.   Marland Kitchen1-3 Lead EKG Interpretation  Performed by: Carrson Lightcap, Layla Maw, DO Authorized by: Raghav Verrilli, Layla Maw, DO     Interpretation: normal     ECG rate:  96   ECG rate assessment: normal     Rhythm: sinus rhythm     Ectopy: none     Conduction: normal       IMPRESSION / MDM / ASSESSMENT AND PLAN / ED COURSE  I reviewed the triage vital signs and the nursing notes.    Patient here with fever, vomiting, diarrhea, altered mental status from his nursing home.  The patient is on the cardiac monitor to evaluate for evidence of arrhythmia and/or significant heart rate changes.   DIFFERENTIAL DIAGNOSIS (includes but not limited to):   Sepsis, bacteremia, pneumonia, viral URI, viral gastroenteritis, colitis, diverticulitis, appendicitis, cholecystitis, pancreatitis, UTI   Patient's presentation is most consistent with acute presentation with potential threat to life or bodily function.   PLAN: Will obtain septic workup with labs,  cultures, chest x-ray, CT of the head, CT of abdomen pelvis.  Will give IV fluids, broad-spectrum antibiotics, Tylenol.   MEDICATIONS GIVEN IN ED: Medications  lactated ringers bolus 500 mL (0 mLs Intravenous Stopped 07/06/23 0150)  ceFEPIme (MAXIPIME) 2 g in sodium chloride 0.9 % 100 mL IVPB (0  g Intravenous Stopped 07/06/23 0114)  metroNIDAZOLE (FLAGYL) IVPB 500 mg (0 mg Intravenous Stopped 07/06/23 0150)  vancomycin (VANCOCIN) IVPB 1000 mg/200 mL premix (1,000 mg Intravenous New Bag/Given 07/06/23 0114)  acetaminophen (TYLENOL) suppository 650 mg (650 mg Rectal Given 07/05/23 2359)  ondansetron (ZOFRAN) injection 4 mg (4 mg Intravenous Given 07/05/23 2355)  lactated ringers bolus 1,500 mL (1,000 mLs Intravenous New Bag/Given 07/06/23 0152)  iohexol (OMNIPAQUE) 300 MG/ML solution 100 mL (100 mLs Intravenous Contrast Given 07/06/23 0101)     ED COURSE: Lactic elevated at 2.7.  Will give 2 L of IV fluids for 30 mL/kg IV fluid bolus based on patient's ideal body weight.  Labs show no leukocytosis.  Elevated creatinine, normal potassium.  Normal LFTs and lipase.  Troponin minimally elevated likely from chronic kidney disease.  Procalcitonin is 7.04.  CT of the head, abdomen pelvis, chest x-ray reviewed and interpreted by myself and the radiologist.  Bladder wall appears thickened.  Will obtain urinalysis to rule out cystitis.  There is a cavitary lesion noted in the lingula that was present on previous imaging in June 2024 and appears to be stable.  CT of the head shows no acute abnormality.  Chest x-ray shows no pneumonia.    COVID, flu and RSV negative.  Repeat lactic pending after IV fluids.  Patient resting comfortably, in no distress.  Blood pressure stable.  Will discuss with hospitalist for admission.  Patient possibly septic from cystitis versus viral gastroenteritis.  CONSULTS:  Consulted and discussed patient's case with hospitalist, Dr. Para March.  I have recommended admission and  consulting physician agrees and will place admission orders.  Patient (and family if present) agree with this plan.   I reviewed all nursing notes, vitals, pertinent previous records.  All labs, EKGs, imaging ordered have been independently reviewed and interpreted by myself.    OUTSIDE RECORDS REVIEWED: Reviewed last admission in June 2024.       FINAL CLINICAL IMPRESSION(S) / ED DIAGNOSES   Final diagnoses:  Acute sepsis (HCC)  Nausea vomiting and diarrhea     Rx / DC Orders   ED Discharge Orders     None        Note:  This document was prepared using Dragon voice recognition software and may include unintentional dictation errors.   Marshelle Bilger, Layla Maw, DO 07/06/23 0216    Quanesha Klimaszewski, Layla Maw, DO 07/06/23 1610

## 2023-07-05 NOTE — Progress Notes (Signed)
Elink monitoring for the code sepsis protocol.  

## 2023-07-05 NOTE — ED Triage Notes (Signed)
Pt arrives via [facility] from [facility] with CC of weakness/vomiting since 1700 today. Staff reported pt "not acting normal" but did not give any S&S. Pt oriented to person but will not answer other orientation questions at this time.

## 2023-07-05 NOTE — Progress Notes (Signed)
CODE SEPSIS - PHARMACY COMMUNICATION  **Broad Spectrum Antibiotics should be administered within 1 hour of Sepsis diagnosis**  Time Code Sepsis Called/Page Received: 2342  Antibiotics Ordered: Cefepime, Vancomycin, Flagyl  Time of 1st antibiotic administration: 2358  Otelia Sergeant, PharmD, [facility] 07/05/2023 11:42 PM

## 2023-07-06 ENCOUNTER — Emergency Department: Payer: Medicare Other

## 2023-07-06 DIAGNOSIS — Z85831 Personal history of malignant neoplasm of soft tissue: Secondary | ICD-10-CM | POA: Diagnosis not present

## 2023-07-06 DIAGNOSIS — R112 Nausea with vomiting, unspecified: Secondary | ICD-10-CM | POA: Diagnosis present

## 2023-07-06 DIAGNOSIS — D631 Anemia in chronic kidney disease: Secondary | ICD-10-CM | POA: Diagnosis present

## 2023-07-06 DIAGNOSIS — R6520 Severe sepsis without septic shock: Secondary | ICD-10-CM | POA: Diagnosis present

## 2023-07-06 DIAGNOSIS — B1920 Unspecified viral hepatitis C without hepatic coma: Secondary | ICD-10-CM | POA: Diagnosis present

## 2023-07-06 DIAGNOSIS — F0390 Unspecified dementia without behavioral disturbance: Secondary | ICD-10-CM | POA: Diagnosis present

## 2023-07-06 DIAGNOSIS — R911 Solitary pulmonary nodule: Secondary | ICD-10-CM | POA: Diagnosis present

## 2023-07-06 DIAGNOSIS — Z992 Dependence on renal dialysis: Secondary | ICD-10-CM | POA: Diagnosis not present

## 2023-07-06 DIAGNOSIS — I2510 Atherosclerotic heart disease of native coronary artery without angina pectoris: Secondary | ICD-10-CM | POA: Diagnosis present

## 2023-07-06 DIAGNOSIS — A4189 Other specified sepsis: Secondary | ICD-10-CM | POA: Diagnosis present

## 2023-07-06 DIAGNOSIS — I120 Hypertensive chronic kidney disease with stage 5 chronic kidney disease or end stage renal disease: Secondary | ICD-10-CM | POA: Diagnosis present

## 2023-07-06 DIAGNOSIS — B181 Chronic viral hepatitis B without delta-agent: Secondary | ICD-10-CM | POA: Diagnosis present

## 2023-07-06 DIAGNOSIS — B348 Other viral infections of unspecified site: Secondary | ICD-10-CM | POA: Diagnosis present

## 2023-07-06 DIAGNOSIS — N186 End stage renal disease: Secondary | ICD-10-CM | POA: Diagnosis present

## 2023-07-06 DIAGNOSIS — Z7982 Long term (current) use of aspirin: Secondary | ICD-10-CM | POA: Diagnosis not present

## 2023-07-06 DIAGNOSIS — R1110 Vomiting, unspecified: Secondary | ICD-10-CM

## 2023-07-06 DIAGNOSIS — J9811 Atelectasis: Secondary | ICD-10-CM | POA: Diagnosis present

## 2023-07-06 DIAGNOSIS — G9341 Metabolic encephalopathy: Secondary | ICD-10-CM | POA: Diagnosis present

## 2023-07-06 DIAGNOSIS — A419 Sepsis, unspecified organism: Secondary | ICD-10-CM | POA: Diagnosis not present

## 2023-07-06 DIAGNOSIS — N2581 Secondary hyperparathyroidism of renal origin: Secondary | ICD-10-CM | POA: Diagnosis present

## 2023-07-06 DIAGNOSIS — I700 Atherosclerosis of aorta: Secondary | ICD-10-CM | POA: Diagnosis present

## 2023-07-06 DIAGNOSIS — C9000 Multiple myeloma not having achieved remission: Secondary | ICD-10-CM | POA: Diagnosis present

## 2023-07-06 DIAGNOSIS — Z79899 Other long term (current) drug therapy: Secondary | ICD-10-CM | POA: Diagnosis not present

## 2023-07-06 DIAGNOSIS — Z888 Allergy status to other drugs, medicaments and biological substances status: Secondary | ICD-10-CM | POA: Diagnosis not present

## 2023-07-06 DIAGNOSIS — E872 Acidosis, unspecified: Secondary | ICD-10-CM | POA: Diagnosis present

## 2023-07-06 DIAGNOSIS — Z1152 Encounter for screening for COVID-19: Secondary | ICD-10-CM | POA: Diagnosis not present

## 2023-07-06 LAB — PROCALCITONIN: Procalcitonin: 7.04 ng/mL

## 2023-07-06 LAB — COMPREHENSIVE METABOLIC PANEL
ALT: 22 U/L (ref 0–44)
AST: 24 U/L (ref 15–41)
Albumin: 3.5 g/dL (ref 3.5–5.0)
Alkaline Phosphatase: 111 U/L (ref 38–126)
Anion gap: 15 (ref 5–15)
BUN: 32 mg/dL — ABNORMAL HIGH (ref 8–23)
CO2: 27 mmol/L (ref 22–32)
Calcium: 8.9 mg/dL (ref 8.9–10.3)
Chloride: 95 mmol/L — ABNORMAL LOW (ref 98–111)
Creatinine, Ser: 6.39 mg/dL — ABNORMAL HIGH (ref 0.61–1.24)
GFR, Estimated: 9 mL/min — ABNORMAL LOW (ref >60)
Glucose, Bld: 145 mg/dL — ABNORMAL HIGH (ref 70–99)
Potassium: 4.6 mmol/L (ref 3.5–5.1)
Sodium: 137 mmol/L (ref 135–145)
Total Bilirubin: 1 mg/dL (ref <1.2)
Total Protein: 8.7 g/dL — ABNORMAL HIGH (ref 6.5–8.1)

## 2023-07-06 LAB — LIPASE, BLOOD: Lipase: 33 U/L (ref 11–51)

## 2023-07-06 LAB — URINALYSIS, W/ REFLEX TO CULTURE (INFECTION SUSPECTED)
Bacteria, UA: NONE SEEN
Bilirubin Urine: NEGATIVE
Glucose, UA: NEGATIVE mg/dL
Ketones, ur: NEGATIVE mg/dL
Leukocytes,Ua: NEGATIVE
Nitrite: NEGATIVE
Protein, ur: >=300 mg/dL — AB
Specific Gravity, Urine: 1.023 (ref 1.005–1.030)
pH: 6 (ref 5.0–8.0)

## 2023-07-06 LAB — CORTISOL-AM, BLOOD: Cortisol - AM: 27.6 ug/dL — ABNORMAL HIGH (ref 6.7–22.6)

## 2023-07-06 LAB — HEPATITIS B SURFACE ANTIGEN: Hepatitis B Surface Ag: NONREACTIVE

## 2023-07-06 LAB — CBC WITH DIFFERENTIAL/PLATELET
Abs Immature Granulocytes: 0.04 10*3/uL (ref 0.00–0.07)
Basophils Absolute: 0 10*3/uL (ref 0.0–0.1)
Basophils Relative: 0 %
Eosinophils Absolute: 0 10*3/uL (ref 0.0–0.5)
Eosinophils Relative: 0 %
HCT: 39.2 % (ref 39.0–52.0)
Hemoglobin: 12.6 g/dL — ABNORMAL LOW (ref 13.0–17.0)
Immature Granulocytes: 0 %
Lymphocytes Relative: 5 %
Lymphs Abs: 0.5 10*3/uL — ABNORMAL LOW (ref 0.7–4.0)
MCH: 28.5 pg (ref 26.0–34.0)
MCHC: 32.1 g/dL (ref 30.0–36.0)
MCV: 88.7 fL (ref 80.0–100.0)
Monocytes Absolute: 0.6 10*3/uL (ref 0.1–1.0)
Monocytes Relative: 6 %
Neutro Abs: 8.5 10*3/uL — ABNORMAL HIGH (ref 1.7–7.7)
Neutrophils Relative %: 89 %
Platelets: 222 10*3/uL (ref 150–400)
RBC: 4.42 MIL/uL (ref 4.22–5.81)
RDW: 16.2 % — ABNORMAL HIGH (ref 11.5–15.5)
WBC: 9.6 10*3/uL (ref 4.0–10.5)
nRBC: 0 % (ref 0.0–0.2)

## 2023-07-06 LAB — RESP PANEL BY RT-PCR (RSV, FLU A&B, COVID)  RVPGX2
Influenza A by PCR: NEGATIVE
Influenza B by PCR: NEGATIVE
Resp Syncytial Virus by PCR: NEGATIVE
SARS Coronavirus 2 by RT PCR: NEGATIVE

## 2023-07-06 LAB — MRSA NEXT GEN BY PCR, NASAL: MRSA by PCR Next Gen: DETECTED — AB

## 2023-07-06 LAB — TROPONIN I (HIGH SENSITIVITY)
Troponin I (High Sensitivity): 31 ng/L — ABNORMAL HIGH (ref <18)
Troponin I (High Sensitivity): 37 ng/L — ABNORMAL HIGH (ref <18)

## 2023-07-06 LAB — LACTIC ACID, PLASMA
Lactic Acid, Venous: 1.4 mmol/L (ref 0.5–1.9)
Lactic Acid, Venous: 2 mmol/L (ref 0.5–1.9)
Lactic Acid, Venous: 2.5 mmol/L (ref 0.5–1.9)
Lactic Acid, Venous: 2.7 mmol/L (ref 0.5–1.9)

## 2023-07-06 LAB — PROTIME-INR
INR: 1.3 — ABNORMAL HIGH (ref 0.8–1.2)
Prothrombin Time: 16 s — ABNORMAL HIGH (ref 11.4–15.2)

## 2023-07-06 LAB — APTT: aPTT: 32 s (ref 24–36)

## 2023-07-06 MED ORDER — ONDANSETRON HCL 4 MG/2ML IJ SOLN: 4.0000 mg | Freq: Four times a day (QID) | INTRAMUSCULAR | Status: DC | PRN

## 2023-07-06 MED ORDER — NETARSUDIL DIMESYLATE 0.02 % OP SOLN: 1.0000 [drp] | Freq: Every day | OPHTHALMIC | Status: DC

## 2023-07-06 MED ORDER — ACETAMINOPHEN 650 MG RE SUPP: 650.0000 mg | Freq: Four times a day (QID) | RECTAL | Status: DC | PRN

## 2023-07-06 MED ORDER — ACETAMINOPHEN 325 MG PO TABS: 650.0000 mg | ORAL_TABLET | Freq: Four times a day (QID) | ORAL | Status: DC | PRN

## 2023-07-06 MED ORDER — VANCOMYCIN HCL 1250 MG/250ML IV SOLN
1250.0000 mg | Freq: Once | INTRAVENOUS | Status: AC
  Administered 2023-07-06: 1250 mg via INTRAVENOUS
  Filled 2023-07-06: qty 250

## 2023-07-06 MED ORDER — ASPIRIN 81 MG PO CHEW
81.0000 mg | CHEWABLE_TABLET | Freq: Every day | ORAL | Status: DC
  Administered 2023-07-06 – 2023-07-08 (×3): 81 mg via ORAL
  Filled 2023-07-06 (×3): qty 1

## 2023-07-06 MED ORDER — ENTECAVIR 0.5 MG PO TABS: 0.5000 mg | ORAL_TABLET | ORAL | Status: DC

## 2023-07-06 MED ORDER — LACTATED RINGERS IV BOLUS
1500.0000 mL | Freq: Once | INTRAVENOUS | Status: AC
  Administered 2023-07-06: 1000 mL via INTRAVENOUS
  Administered 2023-07-06: 500 mL via INTRAVENOUS

## 2023-07-06 MED ORDER — LATANOPROST 0.005 % OP SOLN
1.0000 [drp] | Freq: Every day | OPHTHALMIC | Status: DC
  Administered 2023-07-06 – 2023-07-07 (×2): 1 [drp] via OPHTHALMIC
  Filled 2023-07-06 (×2): qty 2.5

## 2023-07-06 MED ORDER — ONDANSETRON HCL 4 MG PO TABS: 4.0000 mg | ORAL_TABLET | Freq: Four times a day (QID) | ORAL | Status: DC | PRN

## 2023-07-06 MED ORDER — IPRATROPIUM-ALBUTEROL 0.5-2.5 (3) MG/3ML IN SOLN
3.0000 mL | Freq: Four times a day (QID) | RESPIRATORY_TRACT | Status: DC
  Administered 2023-07-06 – 2023-07-08 (×7): 3 mL via RESPIRATORY_TRACT
  Filled 2023-07-06 (×8): qty 3

## 2023-07-06 MED ORDER — DONEPEZIL HCL 5 MG PO TABS
10.0000 mg | ORAL_TABLET | Freq: Every day | ORAL | Status: DC
  Administered 2023-07-06 – 2023-07-07 (×2): 10 mg via ORAL
  Filled 2023-07-06 (×2): qty 2

## 2023-07-06 MED ORDER — MAGNESIUM OXIDE -MG SUPPLEMENT 400 (240 MG) MG PO TABS
400.0000 mg | ORAL_TABLET | Freq: Every day | ORAL | Status: DC
  Administered 2023-07-06 – 2023-07-08 (×3): 400 mg via ORAL
  Filled 2023-07-06 (×3): qty 1

## 2023-07-06 MED ORDER — CARVEDILOL 6.25 MG PO TABS: 12.5000 mg | ORAL_TABLET | Freq: Every day | ORAL | Status: DC

## 2023-07-06 MED ORDER — VANCOMYCIN HCL IN DEXTROSE 1-5 GM/200ML-% IV SOLN: 1000.0000 mg | INTRAVENOUS | Status: DC

## 2023-07-06 MED ORDER — IOHEXOL 300 MG/ML  SOLN
100.0000 mL | Freq: Once | INTRAMUSCULAR | Status: AC | PRN
  Administered 2023-07-06: 100 mL via INTRAVENOUS

## 2023-07-06 MED ORDER — METRONIDAZOLE 500 MG/100ML IV SOLN
500.0000 mg | Freq: Two times a day (BID) | INTRAVENOUS | Status: DC
  Administered 2023-07-06 (×2): 500 mg via INTRAVENOUS
  Filled 2023-07-06 (×3): qty 100

## 2023-07-06 MED ORDER — SODIUM CHLORIDE 0.9 % IV SOLN
1.0000 g | INTRAVENOUS | Status: DC
  Administered 2023-07-06: 1 g via INTRAVENOUS
  Filled 2023-07-06 (×2): qty 10

## 2023-07-06 MED ORDER — MEMANTINE HCL 5 MG PO TABS
5.0000 mg | ORAL_TABLET | Freq: Two times a day (BID) | ORAL | Status: DC
  Administered 2023-07-06 – 2023-07-08 (×5): 5 mg via ORAL
  Filled 2023-07-06 (×5): qty 1

## 2023-07-06 MED ORDER — PANTOPRAZOLE SODIUM 40 MG PO TBEC
40.0000 mg | DELAYED_RELEASE_TABLET | Freq: Every day | ORAL | Status: DC
  Administered 2023-07-06 – 2023-07-08 (×3): 40 mg via ORAL
  Filled 2023-07-06 (×3): qty 1

## 2023-07-06 MED ORDER — SIMVASTATIN 20 MG PO TABS
10.0000 mg | ORAL_TABLET | Freq: Every day | ORAL | Status: DC
  Administered 2023-07-06 – 2023-07-07 (×2): 10 mg via ORAL
  Filled 2023-07-06 (×2): qty 1

## 2023-07-06 MED ORDER — HEPARIN SODIUM (PORCINE) 5000 UNIT/ML IJ SOLN
5000.0000 [IU] | Freq: Three times a day (TID) | INTRAMUSCULAR | Status: DC
  Administered 2023-07-06 – 2023-07-08 (×8): 5000 [IU] via SUBCUTANEOUS
  Filled 2023-07-06 (×8): qty 1

## 2023-07-06 NOTE — Discharge Planning (Signed)
ESTABLISHED HEMODIALYSIS Outpatient Facility [facility] [address] [place], [place] [address] [phone]   Schedule:  TTS 11:30am  Dimas Chyle Dialysis Coordinator II  [facility] Cell: [phone] eFax: [phone] [email or web address]

## 2023-07-06 NOTE — Assessment & Plan Note (Signed)
No complaints of chest pain and EKG nonacute Continue aspirin carvedilol and simvastatin

## 2023-07-06 NOTE — Assessment & Plan Note (Signed)
Secondary to sepsis Aspiration precautions, fall precautions Neurologic checks

## 2023-07-06 NOTE — ED Notes (Signed)
Rn to bedside to address IV pump alarm. Pt IV unable to be flushed and unable to get blood return. Flagyl infusing at the time of IV occlusion alarm. RN attempted to reposition IV site. IV removed by this RN due to unable to be flushed. No obvious swelling to sight. Pharmacy called about protocol due to IV abx infusing. No medication specific protocol.

## 2023-07-06 NOTE — Progress Notes (Signed)
[facility]  ROUNDING NOTE   Subjective:   Anthony Houston is a 71 year old male with past medical history of hypertension, dementia, multiple myeloma, chronic hepatitis B, glaucoma, and end-stage renal disease on hemodialysis.  Patient presents to the emergency department from his skilled nursing facility with complaints of nausea, vomiting, and altered mental status.  Patient has been admitted for Sepsis [facility]) [A41.9]  Patient is known to our practice and receives outpatient dialysis treatments at [facility] on a MWF schedule, followed by Dr Thedore Mins last treatment received on Monday.  Patient is seen on stretcher, somnolent.  Chart review used to obtain history.  Baseline unclear.  No known falls reported from facility.  Labs on ED arrival significant for glucose 145, BUN 32, creatinine 6.39 with GFR 9, lactic acid 2.7, troponin 31, hemoglobin 12.6.  Respiratory panel negative for influenza, COVID-19, and RSV.  Chest x-ray shows bibasilar atelectasis.  CT abdomen pelvis with contrast shows bilateral renal cyst consistent with polycystic kidney disease, and questionable cystitis.  CT head negative for acute findings.  We have been consulted to manage dialysis needs during this admission.   Objective:  Vital signs in last 24 hours:  Temp:  [97.8 F (36.6 C)-103.1 F (39.5 C)] 97.8 F (36.6 C) (12/24 1001) Pulse Rate:  [71-102] 76 (12/24 1015) Resp:  [17-24] 17 (12/24 1015) BP: (95-139)/(59-84) 108/72 (12/24 1015) SpO2:  [92 %-99 %] 96 % (12/24 1015) Weight:  [95.6 kg] 95.6 kg (12/24 0012)  Weight change:  Filed Weights   07/06/23 0012  Weight: 95.6 kg    Intake/Output: I/O last 3 completed shifts: In: 2050 [IV Piggyback:2050] Out: -    Intake/Output this shift:  No intake/output data recorded.  Physical Exam: General: NAD, chronically ill-appearing  Head: Normocephalic, atraumatic. Moist oral mucosal membranes  Eyes: Anicteric  Lungs:  Clear to  auscultation, normal effort  Heart: Regular rate and rhythm  Abdomen:  Soft, nontender, nondistended  Extremities: Trace peripheral edema.  Neurologic: Somnolent  Skin: No lesions  Access: Left AVG    Basic Metabolic Panel: Recent Labs  Lab 07/05/23 2347  NA 137  K 4.6  CL 95*  CO2 27  GLUCOSE 145*  BUN 32*  CREATININE 6.39*  CALCIUM 8.9    Liver Function Tests: Recent Labs  Lab 07/05/23 2347  AST 24  ALT 22  ALKPHOS 111  BILITOT 1.0  PROT 8.7*  ALBUMIN 3.5   Recent Labs  Lab 07/05/23 2347  LIPASE 33   No results for input(s): "AMMONIA" in the last 168 hours.  CBC: Recent Labs  Lab 07/05/23 2347  WBC 9.6  NEUTROABS 8.5*  HGB 12.6*  HCT 39.2  MCV 88.7  PLT 222    Cardiac Enzymes: No results for input(s): "CKTOTAL", "CKMB", "CKMBINDEX", "TROPONINI" in the last 168 hours.  BNP: Invalid input(s): "POCBNP"  CBG: No results for input(s): "GLUCAP" in the last 168 hours.  Microbiology: Results for orders placed or performed during the hospital encounter of 07/05/23  Resp panel by RT-PCR (RSV, Flu A&B, Covid) Anterior Nasal Swab     Status: None   Collection Time: 07/05/23 11:47 PM   Specimen: Anterior Nasal Swab  Result Value Ref Range Status   SARS Coronavirus 2 by RT PCR NEGATIVE NEGATIVE Final    Comment: (NOTE) SARS-CoV-2 target nucleic acids are NOT DETECTED.  The SARS-CoV-2 RNA is generally detectable in upper respiratory specimens during the acute phase of infection. The lowest concentration of SARS-CoV-2 viral  copies this assay can detect is 138 copies/mL. A negative result does not preclude SARS-Cov-2 infection and should not be used as the sole basis for treatment or other patient management decisions. A negative result may occur with  improper specimen collection/handling, submission of specimen other than nasopharyngeal swab, presence of viral mutation(s) within the areas targeted by this assay, and inadequate number of  viral copies(<138 copies/mL). A negative result must be combined with clinical observations, patient history, and epidemiological information. The expected result is Negative.  Fact Sheet for Patients:  BloggerCourse.com  Fact Sheet for Healthcare Providers:  SeriousBroker.it  This test is no t yet approved or cleared by the [place] FDA and  has been authorized for detection and/or diagnosis of SARS-CoV-2 by FDA under an Emergency Use Authorization (EUA). This EUA will remain  in effect (meaning this test can be used) for the duration of the COVID-19 declaration under Section 564(b)(1) of the Act, 21 U.S.C.section 360bbb-3(b)(1), unless the authorization is terminated  or revoked sooner.       Influenza A by PCR NEGATIVE NEGATIVE Final   Influenza B by PCR NEGATIVE NEGATIVE Final    Comment: (NOTE) The Xpert Xpress SARS-CoV-2/FLU/RSV plus assay is intended as an aid in the diagnosis of influenza from Nasopharyngeal swab specimens and should not be used as a sole basis for treatment. Nasal washings and aspirates are unacceptable for Xpert Xpress SARS-CoV-2/FLU/RSV testing.  Fact Sheet for Patients: BloggerCourse.com  Fact Sheet for Healthcare Providers: SeriousBroker.it  This test is not yet approved or cleared by the [place] FDA and has been authorized for detection and/or diagnosis of SARS-CoV-2 by FDA under an Emergency Use Authorization (EUA). This EUA will remain in effect (meaning this test can be used) for the duration of the COVID-19 declaration under Section 564(b)(1) of the Act, 21 U.S.C. section 360bbb-3(b)(1), unless the authorization is terminated or revoked.     Resp Syncytial Virus by PCR NEGATIVE NEGATIVE Final    Comment: (NOTE) Fact Sheet for Patients: BloggerCourse.com  Fact Sheet for Healthcare  Providers: SeriousBroker.it  This test is not yet approved or cleared by the [place] FDA and has been authorized for detection and/or diagnosis of SARS-CoV-2 by FDA under an Emergency Use Authorization (EUA). This EUA will remain in effect (meaning this test can be used) for the duration of the COVID-19 declaration under Section 564(b)(1) of the Act, 21 U.S.C. section 360bbb-3(b)(1), unless the authorization is terminated or revoked.  Performed at [facility], [address] [place], [place] [address]   Blood Culture (routine x 2)     Status: None (Preliminary result)   Collection Time: 07/05/23 11:47 PM   Specimen: BLOOD RIGHT ARM  Result Value Ref Range Status   Specimen Description BLOOD RIGHT ARM  Final   Special Requests   Final    BOTTLES DRAWN AEROBIC AND ANAEROBIC Blood Culture adequate volume   Culture   Final    NO GROWTH < 12 HOURS Performed at [facility], [address] [place], [place] [address]    Report Status PENDING  Incomplete  Blood Culture (routine x 2)     Status: None (Preliminary result)   Collection Time: 07/05/23 11:47 PM   Specimen: BLOOD RIGHT ARM  Result Value Ref Range Status   Specimen Description BLOOD RIGHT ARM  Final   Special Requests   Final    BOTTLES DRAWN AEROBIC AND ANAEROBIC Blood Culture adequate volume   Culture   Final  NO GROWTH < 12 HOURS Performed at [facility], [address] [place], [place] [address]    Report Status PENDING  Incomplete  MRSA Next Gen by PCR, Nasal     Status: Abnormal   Collection Time: 07/06/23  9:11 AM   Specimen: Nasal Mucosa; Nasal Swab  Result Value Ref Range Status   MRSA by PCR Next Gen DETECTED (A) NOT DETECTED Final    Comment: RESULT CALLED TO, READ BACK BY AND VERIFIED WITH: LOGAN RAGGINS AT 1207 ON 07/06/23 BY SS (NOTE) The GeneXpert MRSA Assay (FDA approved for NASAL specimens only), is one component of a comprehensive  MRSA colonization surveillance program. It is not intended to diagnose MRSA infection nor to guide or monitor treatment for MRSA infections. Test performance is not FDA approved in patients less than 69 years old. Performed at [facility], [address] [place], [place] [address]     Coagulation Studies: Recent Labs    07/05/23 2347  LABPROT 16.0*  INR 1.3*    Urinalysis: Recent Labs    07/06/23 0244  COLORURINE YELLOW*  LABSPEC 1.023  PHURINE 6.0  GLUCOSEU NEGATIVE  HGBUR SMALL*  BILIRUBINUR NEGATIVE  KETONESUR NEGATIVE  PROTEINUR >=300*  NITRITE NEGATIVE  LEUKOCYTESUR NEGATIVE      Imaging: CT ABDOMEN PELVIS W CONTRAST Result Date: 07/06/2023 CLINICAL DATA:  Abdominal pain, fever, weakness, vomiting EXAM: CT ABDOMEN AND PELVIS WITH CONTRAST TECHNIQUE: Multidetector CT imaging of the abdomen and pelvis was performed using the standard protocol following bolus administration of intravenous contrast. RADIATION DOSE REDUCTION: This exam was performed according to the departmental dose-optimization program which includes automated exposure control, adjustment of the mA and/or kV according to patient size and/or use of iterative reconstruction technique. CONTRAST:  OMNIPAQUE IOHEXOL 300 MG/ML  SOLN COMPARISON:  Chest CT 12/17/2022 FINDINGS: Lower chest: Cavitary lesion in the lingula again noted, partially visualized on the 1st image of today's study. This measures approximately 1.8 cm. When measured in the same planes and at the same level on prior study, this is stable. Ground-glass opacities in the lung bases could reflect atelectasis or scarring. Hepatobiliary: No focal hepatic abnormality. Gallbladder unremarkable. Pancreas: No focal abnormality or ductal dilatation. Spleen: No focal abnormality.  Normal size. Adrenals/Urinary Tract: Extensive bilateral renal cysts compatible with polycystic kidney disease. No stones or hydronephrosis. Urinary bladder is  decompressed but appears thick walled. Adrenal glands unremarkable. Stomach/Bowel: Respiratory motion limits evaluation of the bowel. No bowel obstruction. No visible wall thickening. Vascular/Lymphatic: Aortic atherosclerosis. No evidence of aneurysm or adenopathy. Reproductive: No visible focal abnormality. Other: No free fluid or free air. Musculoskeletal: No acute bony abnormality. IMPRESSION: 1. Extensive bilateral renal cysts compatible with polycystic kidney disease. 2. Urinary bladder decompressed but appears thick walled. Recommend correlation with urinalysis to exclude cystitis. 3. Aortic atherosclerosis. 4. Cavitary lesion in the lingula again noted, partially visualized on the 1st image of today's study. This is stable when measured in the same planes and at the same level on prior study. This could be infectious/inflammatory or neoplastic. Recommend follow-up imaging as described on prior chest CT. Electronically Signed   By: Charlett Nose M.D.   On: 07/06/2023 01:45   DG Chest Port 1 View Result Date: 07/06/2023 CLINICAL DATA:  Questionable sepsis - evaluate for abnormality. Weakness, vomiting. EXAM: PORTABLE CHEST 1 VIEW COMPARISON:  12/17/2022 FINDINGS: Linear densities at the lung bases, likely atelectasis. Heart and mediastinal contours within normal limits. No effusions or acute bony abnormality. IMPRESSION: Bibasilar  atelectasis. Electronically Signed   By: Charlett Nose M.D.   On: 07/06/2023 01:42   CT HEAD WO CONTRAST ( ) Result Date: 07/06/2023 CLINICAL DATA:  Altered mental status, weakness, vomiting EXAM: CT HEAD WITHOUT CONTRAST TECHNIQUE: Contiguous axial images were obtained from the base of the skull through the vertex without intravenous contrast. RADIATION DOSE REDUCTION: This exam was performed according to the departmental dose-optimization program which includes automated exposure control, adjustment of the mA and/or kV according to patient size and/or use of iterative  reconstruction technique. COMPARISON:  07/25/2018 FINDINGS: Brain: No evidence of acute infarction, hemorrhage, mass, mass effect, or midline shift. No hydrocephalus or extra-axial fluid collection. Advanced cerebral atrophy for age, with ex vacuo dilatation of the ventricles. Commensurate size of the ventricles and sulci. Periventricular white matter changes, likely the sequela of chronic small vessel ischemic disease. Remote lacunar infarct in the left basal ganglia. Vascular: No hyperdense vessel. Skull: Negative for fracture or focal lesion. Sinuses/Orbits: Mucosal thickening in the left maxillary sinus and ethmoid air cells. No acute finding in the orbits. Other: The mastoid air cells are well aerated. IMPRESSION: No acute intracranial process. Electronically Signed   By: Wiliam Ke M.D.   On: 07/06/2023 01:41     Medications:    ceFEPime (MAXIPIME) IV     metronidazole     [START ON 07/08/2023] vancomycin      aspirin  81 mg Oral Daily   donepezil  10 mg Oral QHS   entecavir  0.5 mg Oral Q3 days   heparin  5,000 Units Subcutaneous Q8H   latanoprost  1 drop Both Eyes QHS   magnesium oxide  400 mg Oral Daily   memantine  5 mg Oral BID   Netarsudil Dimesylate  1 drop Both Eyes QHS   pantoprazole  40 mg Oral Daily   simvastatin  10 mg Oral QHS   acetaminophen **OR** acetaminophen, ondansetron **OR** ondansetron (ZOFRAN) IV  Assessment/ Plan:  [name] is a 72 y.o.  male with past medical history of hypertension, dementia, multiple myeloma, chronic hepatitis B, glaucoma, and end-stage renal disease on hemodialysis.  Patient presents to the emergency department from his skilled nursing facility with complaints of nausea, vomiting, and altered mental status.  Patient has been admitted for Sepsis [facility]) [A41.9]   End-stage renal disease on hemodialysis.  Last treatment received on Monday.  Patient has chronic hepatitis B.  Due to holiday scheduling, will plan next dialysis  treatment for Thursday.  2. Anemia of chronic kidney disease Lab Results  Component Value Date   HGB 12.6 (L) 07/05/2023    Hemoglobin within optimal range.  No need for ESA's at this time.  3. Secondary Hyperparathyroidism: with outpatient labs: PTH 381, phosphorus 3.8, calcium 8.1 on 06/22/23.   Lab Results  Component Value Date   PTH 101 (H) 07/26/2018   CALCIUM 8.9 07/05/2023   PHOS 2.6 07/30/2018    Bone minerals acceptable at this time.  Will continue to monitor during this admission.  4.  Hypertension with chronic kidney disease.  Home regimen includes carvedilol and torsemide.  Patient also receives midodrine on dialysis days only.   LOS: 0 Eyal Greenhaw 12/24/20241:10 PM

## 2023-07-06 NOTE — Assessment & Plan Note (Signed)
Continue Baraclude

## 2023-07-06 NOTE — Plan of Care (Signed)
  Problem: Fluid Volume: Goal: Hemodynamic stability will improve Outcome: Progressing   Problem: Clinical Measurements: Goal: Diagnostic test results will improve Outcome: Progressing   Problem: Respiratory: Goal: Ability to maintain adequate ventilation will improve Outcome: Progressing   Problem: Clinical Measurements: Goal: Ability to maintain clinical measurements within normal limits will improve Outcome: Progressing Goal: Will remain free from infection Outcome: Progressing Goal: Diagnostic test results will improve Outcome: Progressing Goal: Respiratory complications will improve Outcome: Progressing Goal: Cardiovascular complication will be avoided Outcome: Progressing   Problem: Activity: Goal: Risk for activity intolerance will decrease Outcome: Progressing   Problem: Nutrition: Goal: Adequate nutrition will be maintained Outcome: Progressing   Problem: Coping: Goal: Level of anxiety will decrease Outcome: Progressing   Problem: Elimination: Goal: Will not experience complications related to bowel motility Outcome: Progressing Goal: Will not experience complications related to urinary retention Outcome: Progressing   Problem: Pain Management: Goal: General experience of comfort will improve Outcome: Progressing   Problem: Safety: Goal: Ability to remain free from injury will improve Outcome: Progressing   Problem: Skin Integrity: Goal: Risk for impaired skin integrity will decrease Outcome: Progressing

## 2023-07-06 NOTE — Assessment & Plan Note (Signed)
 Nephrology consulted for continuation of dialysis ?

## 2023-07-06 NOTE — Progress Notes (Addendum)
Pharmacy Antibiotic Note  Anthony Houston is a 72 y.o. male with ESRD on MWF HD admitted on 07/05/2023 with infection from unknown source.  Pharmacy has been consulted for Cefepime & Vancomycin dosing for 7 days.  Plan: Pt given Cefepime 2 gm x 1 dose.  Order Cefepime 1 gm q24hr per indication & ESRD on HD status.  Pt given Vancomycin 2250 mg once. Vancomycin 1000 mg IV Q TTS following complete dialysis  Pharmacy will continue to follow and will adjust abx dosing whenever warranted.  Temp (24hrs), Avg:102.3 F (39.1 C), Min:101.4 F (38.6 C), Max:103.1 F (39.5 C)   Recent Labs  Lab 07/05/23 2347 07/06/23 0153  WBC 9.6  --   CREATININE 6.39*  --   LATICACIDVEN 2.7* 2.5*    Estimated Creatinine Clearance: 12.2 mL/min (A) (by C-G formula based on SCr of 6.39 mg/dL (H)).    Allergies  Allergen Reactions   Lisinopril    No Known Allergies     Antimicrobials this admission: 12/24 Cefepime >> x 7 days 12/24 Flagyl >> x 7 days 12/24 Vancomycin >> x 7 days  Microbiology results: 12/23 BCx: Pending  Thank you for allowing pharmacy to be a part of this patient's care.  Bettey Costa, PharmD Clinical Pharmacist 07/06/2023 11:05 AM

## 2023-07-06 NOTE — Assessment & Plan Note (Addendum)
Sepsis of unknown source, possible GI Cavitary lesion lingula-stable from June 2024 Sepsis criteria include fever, tachycardia and tachypnea with lactic acidosis and elevated procalcitonin Patient symptomatic for vomiting,+/- diarrhea(history unreliable) Imaging unrevealing except for stable cavitary lesion that could be infectious/inflammatory or neoplastic Continue broad-spectrum antibiotics with cefepime, vancomycin and Flagyl Received sepsis fluid bolus in the ED Antiemetics.  Monitor for diarrhea and test stool if diarrhea Given dialysis status will hold off on further fluids and monitor BP closely Follow cultures

## 2023-07-06 NOTE — ED Notes (Signed)
IV infiltrated, attempted new IV insertion but was unsuccessful. Barbee Cough, RN will attempt US guided IV.

## 2023-07-06 NOTE — Assessment & Plan Note (Signed)
Delirium precautions Continue Namenda and Aricept

## 2023-07-06 NOTE — Assessment & Plan Note (Signed)
BP stable Continue carvedilol.  Holding torsemide in the setting of sepsis

## 2023-07-06 NOTE — H&P (Addendum)
History and Physical    Patient: Anthony Houston FUX:[id number] DOB: September 02, 1950 DOA: 07/05/2023 DOS: the patient was seen and examined on 07/06/2023 PCP: Pcp, No  Patient coming from: SNF  Chief Complaint:  Chief Complaint  Patient presents with   Weakness   Emesis    HPI: Anthony Houston is a 72 y.o. male with medical history significant for CAD, chronic hepatitis B, ESRD on dialysis, dementia, sent from his facility for vomiting, weakness and altered mental status described as being more confused than his baseline.  Patient is unable to contribute to the history. ED course and data review: Tmax 103.1 with pulse 102, respirations 24 and O2 sat 94% on room air.  BP 134/84. Workup notable for the following: Normal WBC of 9.6 with lactic acid 2.5 and procalcitonin 7.04 Respiratory viral panel negative, urinalysis sterile Hemoglobin at baseline 12.6 Renal function in keeping with dialysis status.  Normal potassium and bicarb Troponin 37 EKG, personally viewed and interpreted showing sinus rhythm at 96 with no ischemic ST-T wave changes  CT head nonacute Chest x-ray bibasilar atelectasis CT abdomen and pelvis showing chronic findings as detailed below IMPRESSION: 1. Extensive bilateral renal cysts compatible with polycystic kidney disease. 2. Urinary bladder decompressed but appears thick walled. Recommend correlation with urinalysis to exclude cystitis. 3. Aortic atherosclerosis. 4. Cavitary lesion in the lingula again noted, partially visualized on the 1st image of today's study. This is stable when measured in the same planes and at the same level on prior study. This could be infectious/inflammatory or neoplastic. Recommend follow-up imaging as described on prior chest CT.  Patient started on cefepime vancomycin and Flagyl for sepsis of unknown source given an LR bolus Hospitalist consulted for admission.     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   Glaucoma    Hypertension    Knee pain, left    Multiple myeloma (HCC)    Past Surgical History:  Procedure Laterality Date   ABDOMINAL SURGERY  w/in 10 years   to remove cancer    DIALYSIS/PERMA CATHETER INSERTION N/A 07/26/2018   Procedure: DIALYSIS/PERMA CATHETER INSERTION;  Surgeon: Renford Dills, MD;  Location: [facility];  Service: Cardiovascular;  Laterality: N/A;   DIALYSIS/PERMA CATHETER INSERTION N/A 05/05/2019   Procedure: DIALYSIS/PERMA CATHETER EXCHANGE;  Surgeon: Renford Dills, MD;  Location: [facility];  Service: Cardiovascular;  Laterality: N/A;   NO PAST SURGERIES     PERIPHERAL VASCULAR THROMBECTOMY Left 05/05/2019   Procedure: PERIPHERAL VASCULAR THROMBECTOMY;  Surgeon: Renford Dills, MD;  Location: [facility];  Service: Cardiovascular;  Laterality: Left;   Social History:  reports that he has never smoked. He has never used smokeless tobacco. He reports that he does not currently use alcohol. He reports that he does not use drugs.  Allergies  Allergen Reactions   Lisinopril    No Known Allergies     Family History  Problem Relation Age of Onset   Varicose Veins Neg Hx     Prior to Admission medications   Medication Sig Start Date End Date Taking? Authorizing Provider  acetaminophen (TYLENOL) 325 MG suppository Place 650 mg rectally every 4 (four) hours as needed for fever or mild pain.    [provider]  aspirin  81 MG chewable tablet Chew 81 mg by mouth daily.    [provider]  brimonidine-timolol (COMBIGAN) 0.2-0.5 % ophthalmic solution Place 1 drop into both eyes 2 (two) times daily.    [provider]  carvedilol (COREG) 12.5 MG tablet Take 12.5 mg by mouth at bedtime.    [provider]  diclofenac sodium (VOLTAREN) 1 % GEL  Apply 4 g topically 4 (four) times daily.    [provider]  donepezil (ARICEPT) 10 MG tablet Take 10 mg by mouth at bedtime.    [provider]  entecavir (BARACLUDE) 0.5 MG tablet Take 0.5 mg by mouth every 3 (three) days.    [provider]  loperamide (IMODIUM A-D) 2 MG tablet Take 4 mg by mouth 3 (three) times daily as needed for diarrhea or loose stools.    [provider]  LUMIGAN 0.01 % SOLN Place 1 drop into both eyes at bedtime. 11/24/22   [provider]  magnesium oxide (MAG-OX) 400 (240 Mg) MG tablet Take 400 mg by mouth daily.    [provider]  memantine (NAMENDA) 5 MG tablet Take 5 mg by mouth 2 (two) times daily. 12/02/22   [provider]  Oyster Shell 500 MG TABS Take 1,000 mg by mouth daily.    [provider]  pantoprazole (PROTONIX) 40 MG tablet Take 40 mg by mouth daily.    [provider]  RHOPRESSA 0.02 % SOLN Place 1 drop into both eyes at bedtime. 10/27/22   [provider]  simvastatin (ZOCOR) 10 MG tablet Take 10 mg by mouth at bedtime.    [provider]  Skin Protectants, Misc. (MINERIN) CREA Apply 1 application topically 2 (two) times daily.    [provider]  torsemide (DEMADEX) 20 MG tablet Take 80 mg by mouth daily.    [provider]    Physical Exam: Vitals:   07/05/23 2331 07/06/23 0012 07/06/23 0156 07/06/23 0200  BP:    113/67  Pulse:    87  Resp:    19  Temp: (!) 103.1 F (39.5 C)  (!) 101.4 F (38.6 C)   TempSrc: Oral  Oral   SpO2:    94%  Weight:  95.6 kg    Height:  6' (1.829 m)     Physical Exam Vitals and nursing note reviewed.  Constitutional:      General: He is not in acute distress. HENT:     Head: Normocephalic and atraumatic.  Cardiovascular:     Rate and Rhythm: Regular rhythm. Tachycardia present.     Heart sounds: Normal heart sounds.  Pulmonary:     Effort: Pulmonary effort is normal.     Breath sounds:  Normal breath sounds.  Abdominal:     Palpations: Abdomen is soft.     Tenderness: There is no abdominal tenderness.  Neurological:     General: No focal deficit present.     Mental Status: He is lethargic.     Labs on Admission: I have personally reviewed following labs and imaging studies  CBC: Recent Labs  Lab 07/05/23 2347  WBC 9.6  NEUTROABS 8.5*  HGB 12.6*  HCT 39.2  MCV 88.7  PLT 222   Basic Metabolic Panel: Recent Labs  Lab 07/05/23 2347  NA 137  K 4.6  CL 95*  CO2 27  GLUCOSE 145*  BUN 32*  CREATININE 6.39*  CALCIUM 8.9   GFR: Estimated Creatinine Clearance: 12.2 mL/min (A) (by C-G  formula based on SCr of 6.39 mg/dL (H)). Liver Function Tests: Recent Labs  Lab 07/05/23 2347  AST 24  ALT 22  ALKPHOS 111  BILITOT 1.0  PROT 8.7*  ALBUMIN 3.5   Recent Labs  Lab 07/05/23 2347  LIPASE 33   No results for input(s): "AMMONIA" in the last 168 hours. Coagulation Profile: Recent Labs  Lab 07/05/23 2347  INR 1.3*   Cardiac Enzymes: No results for input(s): "CKTOTAL", "CKMB", "CKMBINDEX", "TROPONINI" in the last 168 hours. BNP (last 3 results) No results for input(s): "PROBNP" in the last 8760 hours. HbA1C: No results for input(s): "HGBA1C" in the last 72 hours. CBG: No results for input(s): "GLUCAP" in the last 168 hours. Lipid Profile: No results for input(s): "CHOL", "HDL", "LDLCALC", "TRIG", "CHOLHDL", "LDLDIRECT" in the last 72 hours. Thyroid Function Tests: No results for input(s): "TSH", "T4TOTAL", "FREET4", "T3FREE", "THYROIDAB" in the last 72 hours. Anemia Panel: No results for input(s): "VITAMINB12", "FOLATE", "FERRITIN", "TIBC", "IRON", "RETICCTPCT" in the last 72 hours. Urine analysis:    Component Value Date/Time   COLORURINE YELLOW (A) 07/06/2023 0244   APPEARANCEUR HAZY (A) 07/06/2023 0244   APPEARANCEUR Cloudy 03/30/2012 2039   LABSPEC 1.023 07/06/2023 0244   LABSPEC 1.017 03/30/2012 2039   PHURINE 6.0 07/06/2023 0244    GLUCOSEU NEGATIVE 07/06/2023 0244   GLUCOSEU Negative 03/30/2012 2039   HGBUR SMALL (A) 07/06/2023 0244   BILIRUBINUR NEGATIVE 07/06/2023 0244   BILIRUBINUR Negative 03/30/2012 2039   KETONESUR NEGATIVE 07/06/2023 0244   PROTEINUR >=300 (A) 07/06/2023 0244   NITRITE NEGATIVE 07/06/2023 0244   LEUKOCYTESUR NEGATIVE 07/06/2023 0244   LEUKOCYTESUR 3+ 03/30/2012 2039    Radiological Exams on Admission: CT ABDOMEN PELVIS W CONTRAST Result Date: 07/06/2023 CLINICAL DATA:  Abdominal pain, fever, weakness, vomiting EXAM: CT ABDOMEN AND PELVIS WITH CONTRAST TECHNIQUE: Multidetector CT imaging of the abdomen and pelvis was performed using the standard protocol following bolus administration of intravenous contrast. RADIATION DOSE REDUCTION: This exam was performed according to the departmental dose-optimization program which includes automated exposure control, adjustment of the mA and/or kV according to patient size and/or use of iterative reconstruction technique. CONTRAST:  OMNIPAQUE IOHEXOL 300 MG/ML  SOLN COMPARISON:  Chest CT 12/17/2022 FINDINGS: Lower chest: Cavitary lesion in the lingula again noted, partially visualized on the 1st image of today's study. This measures approximately 1.8 cm. When measured in the same planes and at the same level on prior study, this is stable. Ground-glass opacities in the lung bases could reflect atelectasis or scarring. Hepatobiliary: No focal hepatic abnormality. Gallbladder unremarkable. Pancreas: No focal abnormality or ductal dilatation. Spleen: No focal abnormality.  Normal size. Adrenals/Urinary Tract: Extensive bilateral renal cysts compatible with polycystic kidney disease. No stones or hydronephrosis. Urinary bladder is decompressed but appears thick walled. Adrenal glands unremarkable. Stomach/Bowel: Respiratory motion limits evaluation of the bowel. No bowel obstruction. No visible wall thickening. Vascular/Lymphatic: Aortic atherosclerosis. No  evidence of aneurysm or adenopathy. Reproductive: No visible focal abnormality. Other: No free fluid or free air. Musculoskeletal: No acute bony abnormality. IMPRESSION: 1. Extensive bilateral renal cysts compatible with polycystic kidney disease. 2. Urinary bladder decompressed but appears thick walled. Recommend correlation with urinalysis to exclude cystitis. 3. Aortic atherosclerosis. 4. Cavitary lesion in the lingula again noted, partially visualized on the 1st image of today's study. This is stable when measured in the same planes and at the same level on prior study. This could be infectious/inflammatory or neoplastic. Recommend follow-up imaging as described on prior chest CT.  Electronically Signed   By: Charlett Nose M.D.   On: 07/06/2023 01:45   DG Chest Port 1 View Result Date: 07/06/2023 CLINICAL DATA:  Questionable sepsis - evaluate for abnormality. Weakness, vomiting. EXAM: PORTABLE CHEST 1 VIEW COMPARISON:  12/17/2022 FINDINGS: Linear densities at the lung bases, likely atelectasis. Heart and mediastinal contours within normal limits. No effusions or acute bony abnormality. IMPRESSION: Bibasilar atelectasis. Electronically Signed   By: Charlett Nose M.D.   On: 07/06/2023 01:42   CT HEAD WO CONTRAST ( ) Result Date: 07/06/2023 CLINICAL DATA:  Altered mental status, weakness, vomiting EXAM: CT HEAD WITHOUT CONTRAST TECHNIQUE: Contiguous axial images were obtained from the base of the skull through the vertex without intravenous contrast. RADIATION DOSE REDUCTION: This exam was performed according to the departmental dose-optimization program which includes automated exposure control, adjustment of the mA and/or kV according to patient size and/or use of iterative reconstruction technique. COMPARISON:  07/25/2018 FINDINGS: Brain: No evidence of acute infarction, hemorrhage, mass, mass effect, or midline shift. No hydrocephalus or extra-axial fluid collection. Advanced cerebral atrophy for age,  with ex vacuo dilatation of the ventricles. Commensurate size of the ventricles and sulci. Periventricular white matter changes, likely the sequela of chronic small vessel ischemic disease. Remote lacunar infarct in the left basal ganglia. Vascular: No hyperdense vessel. Skull: Negative for fracture or focal lesion. Sinuses/Orbits: Mucosal thickening in the left maxillary sinus and ethmoid air cells. No acute finding in the orbits. Other: The mastoid air cells are well aerated. IMPRESSION: No acute intracranial process. Electronically Signed   By: Wiliam Ke M.D.   On: 07/06/2023 01:41     Data Reviewed: Relevant notes from primary care and specialist visits, past discharge summaries as available in EHR, including Care Everywhere. Prior diagnostic testing as pertinent to current admission diagnoses Updated medications and problem lists for reconciliation ED course, including vitals, labs, imaging, treatment and response to treatment Triage notes, nursing and pharmacy notes and ED provider's notes Notable results as noted in HPI   Assessment and Plan: * Sepsis (HCC) Sepsis of unknown source, possible GI Cavitary lesion lingula-stable from June 2024 Sepsis criteria include fever, tachycardia and tachypnea with lactic acidosis and elevated procalcitonin Patient symptomatic for vomiting,+/- diarrhea(history unreliable) Imaging unrevealing except for stable cavitary lesion that could be infectious/inflammatory or neoplastic Continue broad-spectrum antibiotics with cefepime, vancomycin and Flagyl Received sepsis fluid bolus in the ED Antiemetics.  Monitor for diarrhea and test stool if diarrhea Given dialysis status will hold off on further fluids and monitor BP closely Follow cultures  Acute metabolic encephalopathy Secondary to sepsis Aspiration precautions, fall precautions Neurologic checks  Chronic hepatitis B (HCC) Continue Baraclude  Dementia (HCC) Delirium  precautions Continue Namenda and Aricept  ESRD on hemodialysis [facility]) Nephrology consulted for continuation of dialysis  CAD (coronary artery disease) No complaints of chest pain and EKG nonacute Continue aspirin carvedilol and simvastatin  Hypertension BP stable Continue carvedilol.  Holding torsemide in the setting of sepsis    DVT prophylaxis: heparin  Consults: renal  Advance Care Planning:   Code Status: Prior   Family Communication: none  Disposition Plan: Back to previous home environment  Severity of Illness: The appropriate patient status for this patient is INPATIENT. Inpatient status is judged to be reasonable and necessary in order to provide the required intensity of service to ensure the patient's safety. The patient's presenting symptoms, physical exam findings, and initial radiographic and laboratory data in the context of their chronic comorbidities is felt to place  them at high risk for further clinical deterioration. Furthermore, it is not anticipated that the patient will be medically stable for discharge from the hospital within 2 midnights of admission.   * I certify that at the point of admission it is my clinical judgment that the patient will require inpatient hospital care spanning beyond 2 midnights from the point of admission due to high intensity of service, high risk for further deterioration and high frequency of surveillance required.*  Author: Andris Baumann, MD 07/06/2023 3:19 AM  For on call review [email or web address].

## 2023-07-07 DIAGNOSIS — B348 Other viral infections of unspecified site: Secondary | ICD-10-CM

## 2023-07-07 LAB — RESPIRATORY PANEL BY PCR

## 2023-07-07 LAB — HEPATITIS B SURFACE ANTIBODY, QUANTITATIVE: Hep B S AB Quant (Post): <3.5 m[IU]/mL — ABNORMAL LOW

## 2023-07-07 NOTE — Progress Notes (Signed)
  PROGRESS NOTE    Anthony Houston  [id number]:[id number] DOB: Aug 03, 1950 DOA: 07/05/2023 PCP: Pcp, No  [id number]-AA  LOS: 1 day   Brief hospital course:   Assessment & Plan: Anthony Houston is a 72 y.o. male with medical history significant for CAD, chronic hepatitis B, ESRD on dialysis, dementia, sent from his facility for vomiting, weakness and altered mental status described as being more confused than his baseline.      * Severe Sepsis (HCC) 2/2 Parainfluenza virus 4 infection Sepsis criteria include fever, tachycardia and tachypnea with lactic acidosis and elevated procalcitonin Imaging unrevealing except for stable cavitary lesion that could be infectious/inflammatory or neoplastic --started on cefepime, vancomycin and Flagyl Plan: --d/c abx since pt has viral infection --supportive care  Acute metabolic encephalopathy Secondary to sepsis and parainfluenza viral infection. --mental status much improved today --delirium precaution.    Cavitary lesion lingula-stable from June 2024  Chronic hepatitis B (HCC) Continue Baraclude  Dementia [facility]) Delirium precautions Continue Namenda and Aricept  ESRD on hemodialysis (HCC) --iHD per nephro  CAD (coronary artery disease) No complaints of chest pain and EKG nonacute --cont ASA and statin --hold coreg due to soft BP  Hypertension --hold coreg and torsemide due to soft BP   DVT prophylaxis: Heparin SQ Code Status: Full code  Family Communication:  Level of care: Med-Surg Dispo:   The patient is from: SNF Anticipated d/c is to: SNF Anticipated d/c date is: tomorrow   Subjective and Interval History:  Pt was alert this morning, did not remember what happened since presentation.  Reported cough but no dyspnea.   Objective: Vitals:   07/06/23 2140 07/07/23 0114 07/07/23 0312 07/07/23 0826  BP: 127/63 109/65 112/62 93/60  Pulse: 70 72 73 76  Resp: 17 18 18 15   Temp: 98.9 F (37.2 C) 98.2 F (36.8 C) 98.7  F (37.1 C) 99.7 F (37.6 C)  TempSrc: Oral Oral Oral Oral  SpO2: 94% 96% 96% 94%  Weight:      Height:        Intake/Output Summary (Last 24 hours) at 07/07/2023 1722 Last data filed at 07/07/2023 0400 Gross per 24 hour  Intake 468.01 ml  Output --  Net 468.01 ml   Filed Weights   07/06/23 0012  Weight: 95.6 kg    Examination:   Constitutional: NAD, alert, oriented to person and place HEENT: conjunctivae and lids normal, EOMI CV: No cyanosis.   RESP: normal respiratory effort Neuro: II - XII grossly intact.   Psych: Normal mood and affect.     Data Reviewed: I have personally reviewed labs and imaging studies  Time spent: 50 minutes  Darlin Priestly, MD [facility] If 7PM-7AM, please contact night-coverage 07/07/2023, 5:22 PM

## 2023-07-07 NOTE — Progress Notes (Signed)
[facility]  ROUNDING NOTE   Subjective:   Anthony Houston is a 72 year old male with past medical history of hypertension, dementia, multiple myeloma, chronic hepatitis B, glaucoma, and end-stage renal disease on hemodialysis.  Patient presents to the emergency department from his skilled nursing facility with complaints of nausea, vomiting, and altered mental status.  Patient has been admitted for Nausea vomiting and diarrhea [R11.2, R19.7] Sepsis (HCC) [A41.9] Acute sepsis [facility]) [A41.9]  Patient is known to our practice and receives outpatient dialysis treatments at [facility] on a MWF schedule, followed by Dr Thedore Mins last treatment received on Monday.  Patient is seen on stretcher, somnolent.  Chart review used to obtain history.  Baseline unclear.  No known falls reported from facility.  Labs on ED arrival significant for glucose 145, BUN 32, creatinine 6.39 with GFR 9, lactic acid 2.7, troponin 31, hemoglobin 12.6.  Respiratory panel negative for influenza, COVID-19, and RSV.  Chest x-ray shows bibasilar atelectasis.  CT abdomen pelvis with contrast shows bilateral renal cyst consistent with polycystic kidney disease, and questionable cystitis.  CT head negative for acute findings.  Update: Patient resting comfortably in bed. We are planning for dialysis treatment again tomorrow.  Objective:  Vital signs in last 24 hours:  Temp:  [98.1 F (36.7 C)-99.7 F (37.6 C)] 99.7 F (37.6 C) (12/25 0826) Pulse Rate:  [68-76] 76 (12/25 0826) Resp:  [15-18] 15 (12/25 0826) BP: (93-127)/(60-74) 93/60 (12/25 0826) SpO2:  [93 %-96 %] 94 % (12/25 0826)  Weight change:  Filed Weights   07/06/23 0012  Weight: 95.6 kg    Intake/Output: I/O last 3 completed shifts: In: 2518 [P.O.:240; IV Piggyback:2278] Out: -    Intake/Output this shift:  No intake/output data recorded.  Physical Exam: General: NAD, chronically ill-appearing  Head: Normocephalic, atraumatic.  Moist oral mucosal membranes  Eyes: Anicteric  Lungs:  Clear to auscultation, normal effort  Heart: Regular rate and rhythm  Abdomen:  Soft, nontender, nondistended  Extremities: Trace peripheral edema.  Neurologic: Somnolent  Skin: No lesions  Access: Left AVG    Basic Metabolic Panel: Recent Labs  Lab 07/05/23 2347  NA 137  K 4.6  CL 95*  CO2 27  GLUCOSE 145*  BUN 32*  CREATININE 6.39*  CALCIUM 8.9    Liver Function Tests: Recent Labs  Lab 07/05/23 2347  AST 24  ALT 22  ALKPHOS 111  BILITOT 1.0  PROT 8.7*  ALBUMIN 3.5   Recent Labs  Lab 07/05/23 2347  LIPASE 33   No results for input(s): "AMMONIA" in the last 168 hours.  CBC: Recent Labs  Lab 07/05/23 2347  WBC 9.6  NEUTROABS 8.5*  HGB 12.6*  HCT 39.2  MCV 88.7  PLT 222    Cardiac Enzymes: No results for input(s): "CKTOTAL", "CKMB", "CKMBINDEX", "TROPONINI" in the last 168 hours.  BNP: Invalid input(s): "POCBNP"  CBG: No results for input(s): "GLUCAP" in the last 168 hours.  Microbiology: Results for orders placed or performed during the hospital encounter of 07/05/23  Resp panel by RT-PCR (RSV, Flu A&B, Covid) Anterior Nasal Swab     Status: None   Collection Time: 07/05/23 11:47 PM   Specimen: Anterior Nasal Swab  Result Value Ref Range Status   SARS Coronavirus 2 by RT PCR NEGATIVE NEGATIVE Final    Comment: (NOTE) SARS-CoV-2 target nucleic acids are NOT DETECTED.  The SARS-CoV-2 RNA is generally detectable in upper respiratory specimens during the acute phase of infection. The  lowest concentration of SARS-CoV-2 viral copies this assay can detect is 138 copies/mL. A negative result does not preclude SARS-Cov-2 infection and should not be used as the sole basis for treatment or other patient management decisions. A negative result may occur with  improper specimen collection/handling, submission of specimen other than nasopharyngeal swab, presence of viral mutation(s) within  the areas targeted by this assay, and inadequate number of viral copies(<138 copies/mL). A negative result must be combined with clinical observations, patient history, and epidemiological information. The expected result is Negative.  Fact Sheet for Patients:  BloggerCourse.com  Fact Sheet for Healthcare Providers:  SeriousBroker.it  This test is no t yet approved or cleared by the [place] FDA and  has been authorized for detection and/or diagnosis of SARS-CoV-2 by FDA under an Emergency Use Authorization (EUA). This EUA will remain  in effect (meaning this test can be used) for the duration of the COVID-19 declaration under Section 564(b)(1) of the Act, 21 U.S.C.section 360bbb-3(b)(1), unless the authorization is terminated  or revoked sooner.       Influenza A by PCR NEGATIVE NEGATIVE Final   Influenza B by PCR NEGATIVE NEGATIVE Final    Comment: (NOTE) The Xpert Xpress SARS-CoV-2/FLU/RSV plus assay is intended as an aid in the diagnosis of influenza from Nasopharyngeal swab specimens and should not be used as a sole basis for treatment. Nasal washings and aspirates are unacceptable for Xpert Xpress SARS-CoV-2/FLU/RSV testing.  Fact Sheet for Patients: BloggerCourse.com  Fact Sheet for Healthcare Providers: SeriousBroker.it  This test is not yet approved or cleared by the [place] FDA and has been authorized for detection and/or diagnosis of SARS-CoV-2 by FDA under an Emergency Use Authorization (EUA). This EUA will remain in effect (meaning this test can be used) for the duration of the COVID-19 declaration under Section 564(b)(1) of the Act, 21 U.S.C. section 360bbb-3(b)(1), unless the authorization is terminated or revoked.     Resp Syncytial Virus by PCR NEGATIVE NEGATIVE Final    Comment: (NOTE) Fact Sheet for  Patients: BloggerCourse.com  Fact Sheet for Healthcare Providers: SeriousBroker.it  This test is not yet approved or cleared by the [place] FDA and has been authorized for detection and/or diagnosis of SARS-CoV-2 by FDA under an Emergency Use Authorization (EUA). This EUA will remain in effect (meaning this test can be used) for the duration of the COVID-19 declaration under Section 564(b)(1) of the Act, 21 U.S.C. section 360bbb-3(b)(1), unless the authorization is terminated or revoked.  Performed at [facility], [address] [place], [place] [address]   Blood Culture (routine x 2)     Status: None (Preliminary result)   Collection Time: 07/05/23 11:47 PM   Specimen: BLOOD RIGHT ARM  Result Value Ref Range Status   Specimen Description   Final    BLOOD RIGHT ARM Performed at [facility], [address] [place], [place] [address]    Special Requests   Final    BOTTLES DRAWN AEROBIC AND ANAEROBIC Blood Culture adequate volume Performed at [facility], [address], [place], [place] [address]    Culture   Final    NO GROWTH 2 DAYS Performed at [facility], [address], [place], [place] [address]    Report Status PENDING  Incomplete  Blood Culture (routine x 2)     Status: None (Preliminary result)   Collection Time: 07/05/23 11:47 PM   Specimen: BLOOD RIGHT ARM  Result Value Ref Range Status  Specimen Description   Final    BLOOD RIGHT ARM Performed at [facility], [address] [place], [place] [address]    Special Requests   Final    BOTTLES DRAWN AEROBIC AND ANAEROBIC Blood Culture adequate volume Performed at [facility], [address], [place], [place] [address]    Culture   Final    NO GROWTH 2 DAYS Performed at [facility], [address], [place], [place] [address]    Report Status PENDING  Incomplete  MRSA Next Gen by PCR,  Nasal     Status: Abnormal   Collection Time: 07/06/23  9:11 AM   Specimen: Nasal Mucosa; Nasal Swab  Result Value Ref Range Status   MRSA by PCR Next Gen DETECTED (A) NOT DETECTED Final    Comment: RESULT CALLED TO, READ BACK BY AND VERIFIED WITH: LOGAN RAGGINS AT 1207 ON 07/06/23 BY SS (NOTE) The GeneXpert MRSA Assay (FDA approved for NASAL specimens only), is one component of a comprehensive MRSA colonization surveillance program. It is not intended to diagnose MRSA infection nor to guide or monitor treatment for MRSA infections. Test performance is not FDA approved in patients less than 98 years old. Performed at [facility], [address] [place], [place] [address]   Respiratory (~20 pathogens) panel by PCR     Status: Abnormal   Collection Time: 07/06/23  7:27 PM   Specimen: Nasopharyngeal Swab; Respiratory  Result Value Ref Range Status   Adenovirus NOT DETECTED NOT DETECTED Final   Coronavirus 229E NOT DETECTED NOT DETECTED Final    Comment: (NOTE) The Coronavirus on the Respiratory Panel, DOES NOT test for the novel  Coronavirus (2019 nCoV)    Coronavirus HKU1 NOT DETECTED NOT DETECTED Final   Coronavirus NL63 NOT DETECTED NOT DETECTED Final   Coronavirus OC43 NOT DETECTED NOT DETECTED Final   Metapneumovirus NOT DETECTED NOT DETECTED Final   Rhinovirus / Enterovirus NOT DETECTED NOT DETECTED Final   Influenza A NOT DETECTED NOT DETECTED Final   Influenza B NOT DETECTED NOT DETECTED Final   Parainfluenza Virus 1 NOT DETECTED NOT DETECTED Final   Parainfluenza Virus 2 NOT DETECTED NOT DETECTED Final   Parainfluenza Virus 3 NOT DETECTED NOT DETECTED Final   Parainfluenza Virus 4 DETECTED (A) NOT DETECTED Final   Respiratory Syncytial Virus NOT DETECTED NOT DETECTED Final   Bordetella pertussis NOT DETECTED NOT DETECTED Final   Bordetella Parapertussis NOT DETECTED NOT DETECTED Final   Chlamydophila pneumoniae NOT DETECTED NOT DETECTED Final   Mycoplasma  pneumoniae NOT DETECTED NOT DETECTED Final    Comment: Performed at [facility], [address] [place], [place] [address]    Coagulation Studies: Recent Labs    07/05/23 2347  LABPROT 16.0*  INR 1.3*    Urinalysis: Recent Labs    07/06/23 0244  COLORURINE YELLOW*  LABSPEC 1.023  PHURINE 6.0  GLUCOSEU NEGATIVE  HGBUR SMALL*  BILIRUBINUR NEGATIVE  KETONESUR NEGATIVE  PROTEINUR >=300*  NITRITE NEGATIVE  LEUKOCYTESUR NEGATIVE      Imaging: CT ABDOMEN PELVIS W CONTRAST Result Date: 07/06/2023 CLINICAL DATA:  Abdominal pain, fever, weakness, vomiting EXAM: CT ABDOMEN AND PELVIS WITH CONTRAST TECHNIQUE: Multidetector CT imaging of the abdomen and pelvis was performed using the standard protocol following bolus administration of intravenous contrast. RADIATION DOSE REDUCTION: This exam was performed according to the departmental dose-optimization program which includes automated exposure control, adjustment of the mA and/or kV according to patient size and/or use  of iterative reconstruction technique. CONTRAST:  OMNIPAQUE IOHEXOL 300 MG/ML  SOLN COMPARISON:  Chest CT 12/17/2022 FINDINGS: Lower chest: Cavitary lesion in the lingula again noted, partially visualized on the 1st image of today's study. This measures approximately 1.8 cm. When measured in the same planes and at the same level on prior study, this is stable. Ground-glass opacities in the lung bases could reflect atelectasis or scarring. Hepatobiliary: No focal hepatic abnormality. Gallbladder unremarkable. Pancreas: No focal abnormality or ductal dilatation. Spleen: No focal abnormality.  Normal size. Adrenals/Urinary Tract: Extensive bilateral renal cysts compatible with polycystic kidney disease. No stones or hydronephrosis. Urinary bladder is decompressed but appears thick walled. Adrenal glands unremarkable. Stomach/Bowel: Respiratory motion limits evaluation of the bowel. No bowel obstruction. No visible wall  thickening. Vascular/Lymphatic: Aortic atherosclerosis. No evidence of aneurysm or adenopathy. Reproductive: No visible focal abnormality. Other: No free fluid or free air. Musculoskeletal: No acute bony abnormality. IMPRESSION: 1. Extensive bilateral renal cysts compatible with polycystic kidney disease. 2. Urinary bladder decompressed but appears thick walled. Recommend correlation with urinalysis to exclude cystitis. 3. Aortic atherosclerosis. 4. Cavitary lesion in the lingula again noted, partially visualized on the 1st image of today's study. This is stable when measured in the same planes and at the same level on prior study. This could be infectious/inflammatory or neoplastic. Recommend follow-up imaging as described on prior chest CT. Electronically Signed   By: Charlett Nose M.D.   On: 07/06/2023 01:45   DG Chest Port 1 View Result Date: 07/06/2023 CLINICAL DATA:  Questionable sepsis - evaluate for abnormality. Weakness, vomiting. EXAM: PORTABLE CHEST 1 VIEW COMPARISON:  12/17/2022 FINDINGS: Linear densities at the lung bases, likely atelectasis. Heart and mediastinal contours within normal limits. No effusions or acute bony abnormality. IMPRESSION: Bibasilar atelectasis. Electronically Signed   By: Charlett Nose M.D.   On: 07/06/2023 01:42   CT HEAD WO CONTRAST ( ) Result Date: 07/06/2023 CLINICAL DATA:  Altered mental status, weakness, vomiting EXAM: CT HEAD WITHOUT CONTRAST TECHNIQUE: Contiguous axial images were obtained from the base of the skull through the vertex without intravenous contrast. RADIATION DOSE REDUCTION: This exam was performed according to the departmental dose-optimization program which includes automated exposure control, adjustment of the mA and/or kV according to patient size and/or use of iterative reconstruction technique. COMPARISON:  07/25/2018 FINDINGS: Brain: No evidence of acute infarction, hemorrhage, mass, mass effect, or midline shift. No hydrocephalus or  extra-axial fluid collection. Advanced cerebral atrophy for age, with ex vacuo dilatation of the ventricles. Commensurate size of the ventricles and sulci. Periventricular white matter changes, likely the sequela of chronic small vessel ischemic disease. Remote lacunar infarct in the left basal ganglia. Vascular: No hyperdense vessel. Skull: Negative for fracture or focal lesion. Sinuses/Orbits: Mucosal thickening in the left maxillary sinus and ethmoid air cells. No acute finding in the orbits. Other: The mastoid air cells are well aerated. IMPRESSION: No acute intracranial process. Electronically Signed   By: Wiliam Ke M.D.   On: 07/06/2023 01:41     Medications:      aspirin  81 mg Oral Daily   donepezil  10 mg Oral QHS   entecavir  0.5 mg Oral Q3 days   heparin  5,000 Units Subcutaneous Q8H   ipratropium-albuterol  3 mL Nebulization QID   latanoprost  1 drop Both Eyes QHS   magnesium oxide  400 mg Oral Daily   memantine  5 mg Oral BID   Netarsudil Dimesylate  1 drop Both Eyes QHS  pantoprazole  40 mg Oral Daily   simvastatin  10 mg Oral QHS   acetaminophen **OR** acetaminophen, ondansetron **OR** ondansetron (ZOFRAN) IV  Assessment/ Plan:  [name] is a 72 y.o.  male with past medical history of hypertension, dementia, multiple myeloma, chronic hepatitis B, glaucoma, and end-stage renal disease on hemodialysis.  Patient presents to the emergency department from his skilled nursing facility with complaints of nausea, vomiting, and altered mental status.  Patient has been admitted for Nausea vomiting and diarrhea [R11.2, R19.7] Sepsis (HCC) [A41.9] Acute sepsis (HCC) [A41.9]   End-stage renal disease on hemodialysis.  We plan to perform hemodialysis treatment again tomorrow.  No acute indication for dialysis today.  2. Anemia of chronic kidney disease Lab Results  Component Value Date   HGB 12.6 (L) 07/05/2023    Hemoglobin currently septal.  No need for Epogen  at the moment.  3. Secondary Hyperparathyroidism: with outpatient labs: PTH 381, phosphorus 3.8, calcium 8.1 on 06/22/23.   Lab Results  Component Value Date   PTH 101 (H) 07/26/2018   CALCIUM 8.9 07/05/2023   PHOS 2.6 07/30/2018    Bone minerals acceptable at this time.  Will continue to monitor during this admission.  4.  Hypertension with chronic kidney disease.  Home regimen includes carvedilol and torsemide.  Patient also receives midodrine on dialysis days only.   LOS: 1 Kristain Filo 12/25/20243:52 PM

## 2023-07-07 NOTE — Plan of Care (Signed)

## 2023-07-08 DIAGNOSIS — B348 Other viral infections of unspecified site: Secondary | ICD-10-CM | POA: Diagnosis not present

## 2023-07-08 LAB — BASIC METABOLIC PANEL
Anion gap: 16 — ABNORMAL HIGH (ref 5–15)
BUN: 70 mg/dL — ABNORMAL HIGH (ref 8–23)
CO2: 22 mmol/L (ref 22–32)
Calcium: 7.5 mg/dL — ABNORMAL LOW (ref 8.9–10.3)
Chloride: 94 mmol/L — ABNORMAL LOW (ref 98–111)
Creatinine, Ser: 9.99 mg/dL — ABNORMAL HIGH (ref 0.61–1.24)
GFR, Estimated: 5 mL/min — ABNORMAL LOW (ref >60)
Glucose, Bld: 105 mg/dL — ABNORMAL HIGH (ref 70–99)
Potassium: 4.5 mmol/L (ref 3.5–5.1)
Sodium: 132 mmol/L — ABNORMAL LOW (ref 135–145)

## 2023-07-08 LAB — CBC
HCT: 31.6 % — ABNORMAL LOW (ref 39.0–52.0)
Hemoglobin: 10.5 g/dL — ABNORMAL LOW (ref 13.0–17.0)
MCH: 28.2 pg (ref 26.0–34.0)
MCHC: 33.2 g/dL (ref 30.0–36.0)
MCV: 84.9 fL (ref 80.0–100.0)
Platelets: 175 10*3/uL (ref 150–400)
RBC: 3.72 MIL/uL — ABNORMAL LOW (ref 4.22–5.81)
RDW: 16.1 % — ABNORMAL HIGH (ref 11.5–15.5)
WBC: 3.9 10*3/uL — ABNORMAL LOW (ref 4.0–10.5)
nRBC: 0 % (ref 0.0–0.2)

## 2023-07-08 LAB — MAGNESIUM: Magnesium: 1.9 mg/dL (ref 1.7–2.4)

## 2023-07-08 MED ORDER — HYDROCERIN EX CREA
TOPICAL_CREAM | Freq: Two times a day (BID) | CUTANEOUS | Status: DC
  Filled 2023-07-08: qty 113

## 2023-07-08 NOTE — Progress Notes (Signed)
[facility]  ROUNDING NOTE   Subjective:   Anthony Houston is a 72 year old male with past medical history of hypertension, dementia, multiple myeloma, chronic hepatitis B, glaucoma, and end-stage renal disease on hemodialysis.  Patient presents to the emergency department from his skilled nursing facility with complaints of nausea, vomiting, and altered mental status.  Patient has been admitted for Nausea vomiting and diarrhea [R11.2, R19.7] Sepsis (HCC) [A41.9] Acute sepsis (HCC) [A41.9]  Patient is known to our practice and receives outpatient dialysis treatments at [facility] on a MWF schedule, followed by Dr   Update:  Patient seen and evaluated earlier during dialysis   HEMODIALYSIS FLOWSHEET:  Blood Flow Rate (mL/min): 0 mL/min Arterial Pressure (mmHg): -2.42 mmHg Venous Pressure (mmHg): -2.22 mmHg TMP (mmHg): 20.8 mmHg Ultrafiltration Rate (mL/min): 860 mL/min Dialysate Flow Rate (mL/min): 300 ml/min  Resting comfortably during treatment  Objective:  Vital signs in last 24 hours:  Temp:  [98 F (36.7 C)-99.1 F (37.3 C)] 98 F (36.7 C) (12/26 1513) Pulse Rate:  [70-79] 70 (12/26 1513) Resp:  [16-21] 16 (12/26 1513) BP: (109-156)/(59-94) 124/94 (12/26 1513) SpO2:  [95 %-100 %] 96 % (12/26 1513) Weight:  [103.7 kg] 103.7 kg (12/26 0847)  Weight change:  Filed Weights   07/06/23 0012 07/08/23 0847  Weight: 95.6 kg 103.7 kg    Intake/Output: I/O last 3 completed shifts: In: 928 [P.O.:700; IV Piggyback:228] Out: -    Intake/Output this shift:  Total I/O In: -  Out: 1500 [Other:1500]  Physical Exam: General: NAD  Head: Normocephalic, atraumatic. Moist oral mucosal membranes  Eyes: Anicteric  Lungs:  Clear to auscultation, normal effort  Heart: Regular rate and rhythm  Abdomen:  Soft, nontender, nondistended  Extremities: Trace peripheral edema.  Neurologic: Alert, slow to respond  Skin: No lesions  Access: Left AVG     Basic Metabolic Panel: Recent Labs  Lab 07/05/23 2347 07/08/23 0557  NA 137 132*  K 4.6 4.5  CL 95* 94*  CO2 27 22  GLUCOSE 145* 105*  BUN 32* 70*  CREATININE 6.39* 9.99*  CALCIUM 8.9 7.5*  MG  --  1.9    Liver Function Tests: Recent Labs  Lab 07/05/23 2347  AST 24  ALT 22  ALKPHOS 111  BILITOT 1.0  PROT 8.7*  ALBUMIN 3.5   Recent Labs  Lab 07/05/23 2347  LIPASE 33   No results for input(s): "AMMONIA" in the last 168 hours.  CBC: Recent Labs  Lab 07/05/23 2347 07/08/23 0557  WBC 9.6 3.9*  NEUTROABS 8.5*  --   HGB 12.6* 10.5*  HCT 39.2 31.6*  MCV 88.7 84.9  PLT 222 175    Cardiac Enzymes: No results for input(s): "CKTOTAL", "CKMB", "CKMBINDEX", "TROPONINI" in the last 168 hours.  BNP: Invalid input(s): "POCBNP"  CBG: No results for input(s): "GLUCAP" in the last 168 hours.  Microbiology: Results for orders placed or performed during the hospital encounter of 07/05/23  Resp panel by RT-PCR (RSV, Flu A&B, Covid) Anterior Nasal Swab     Status: None   Collection Time: 07/05/23 11:47 PM   Specimen: Anterior Nasal Swab  Result Value Ref Range Status   SARS Coronavirus 2 by RT PCR NEGATIVE NEGATIVE Final    Comment: (NOTE) SARS-CoV-2 target nucleic acids are NOT DETECTED.  The SARS-CoV-2 RNA is generally detectable in upper respiratory specimens during the acute phase of infection. The lowest concentration of SARS-CoV-2 viral copies this assay can detect is 138 copies/mL.  A negative result does not preclude SARS-Cov-2 infection and should not be used as the sole basis for treatment or other patient management decisions. A negative result may occur with  improper specimen collection/handling, submission of specimen other than nasopharyngeal swab, presence of viral mutation(s) within the areas targeted by this assay, and inadequate number of viral copies(<138 copies/mL). A negative result must be combined with clinical observations, patient  history, and epidemiological information. The expected result is Negative.  Fact Sheet for Patients:  BloggerCourse.com  Fact Sheet for Healthcare Providers:  SeriousBroker.it  This test is no t yet approved or cleared by the [place] FDA and  has been authorized for detection and/or diagnosis of SARS-CoV-2 by FDA under an Emergency Use Authorization (EUA). This EUA will remain  in effect (meaning this test can be used) for the duration of the COVID-19 declaration under Section 564(b)(1) of the Act, 21 U.S.C.section 360bbb-3(b)(1), unless the authorization is terminated  or revoked sooner.       Influenza A by PCR NEGATIVE NEGATIVE Final   Influenza B by PCR NEGATIVE NEGATIVE Final    Comment: (NOTE) The Xpert Xpress SARS-CoV-2/FLU/RSV plus assay is intended as an aid in the diagnosis of influenza from Nasopharyngeal swab specimens and should not be used as a sole basis for treatment. Nasal washings and aspirates are unacceptable for Xpert Xpress SARS-CoV-2/FLU/RSV testing.  Fact Sheet for Patients: BloggerCourse.com  Fact Sheet for Healthcare Providers: SeriousBroker.it  This test is not yet approved or cleared by the [place] FDA and has been authorized for detection and/or diagnosis of SARS-CoV-2 by FDA under an Emergency Use Authorization (EUA). This EUA will remain in effect (meaning this test can be used) for the duration of the COVID-19 declaration under Section 564(b)(1) of the Act, 21 U.S.C. section 360bbb-3(b)(1), unless the authorization is terminated or revoked.     Resp Syncytial Virus by PCR NEGATIVE NEGATIVE Final    Comment: (NOTE) Fact Sheet for Patients: BloggerCourse.com  Fact Sheet for Healthcare Providers: SeriousBroker.it  This test is not yet approved or cleared by the [place] FDA  and has been authorized for detection and/or diagnosis of SARS-CoV-2 by FDA under an Emergency Use Authorization (EUA). This EUA will remain in effect (meaning this test can be used) for the duration of the COVID-19 declaration under Section 564(b)(1) of the Act, 21 U.S.C. section 360bbb-3(b)(1), unless the authorization is terminated or revoked.  Performed at [facility], [address] [place], [place] [address]   Blood Culture (routine x 2)     Status: None (Preliminary result)   Collection Time: 07/05/23 11:47 PM   Specimen: BLOOD RIGHT ARM  Result Value Ref Range Status   Specimen Description BLOOD RIGHT ARM  Final   Special Requests   Final    BOTTLES DRAWN AEROBIC AND ANAEROBIC Blood Culture adequate volume   Culture   Final    NO GROWTH 3 DAYS Performed at [facility], [address], [place], [place] [address]    Report Status PENDING  Incomplete  Blood Culture (routine x 2)     Status: None (Preliminary result)   Collection Time: 07/05/23 11:47 PM   Specimen: BLOOD RIGHT ARM  Result Value Ref Range Status   Specimen Description BLOOD RIGHT ARM  Final   Special Requests   Final    BOTTLES DRAWN AEROBIC AND ANAEROBIC Blood Culture adequate volume   Culture   Final    NO GROWTH 3 DAYS Performed at [facility], [address] [place], [place] [address]    Report Status PENDING  Incomplete  MRSA Next Gen by PCR, Nasal     Status: Abnormal   Collection Time: 07/06/23  9:11 AM   Specimen: Nasal Mucosa; Nasal Swab  Result Value Ref Range Status   MRSA by PCR Next Gen DETECTED (A) NOT DETECTED Final    Comment: RESULT CALLED TO, READ BACK BY AND VERIFIED WITH: LOGAN RAGGINS AT 1207 ON 07/06/23 BY SS (NOTE) The GeneXpert MRSA Assay (FDA approved for NASAL specimens only), is one component of a comprehensive MRSA colonization surveillance program. It is not intended to diagnose MRSA infection nor to guide or monitor treatment for MRSA  infections. Test performance is not FDA approved in patients less than 100 years old. Performed at [facility], [address] [place], [place] [address]   Respiratory (~20 pathogens) panel by PCR     Status: Abnormal   Collection Time: 07/06/23  7:27 PM   Specimen: Nasopharyngeal Swab; Respiratory  Result Value Ref Range Status   Adenovirus NOT DETECTED NOT DETECTED Final   Coronavirus 229E NOT DETECTED NOT DETECTED Final    Comment: (NOTE) The Coronavirus on the Respiratory Panel, DOES NOT test for the novel  Coronavirus (2019 nCoV)    Coronavirus HKU1 NOT DETECTED NOT DETECTED Final   Coronavirus NL63 NOT DETECTED NOT DETECTED Final   Coronavirus OC43 NOT DETECTED NOT DETECTED Final   Metapneumovirus NOT DETECTED NOT DETECTED Final   Rhinovirus / Enterovirus NOT DETECTED NOT DETECTED Final   Influenza A NOT DETECTED NOT DETECTED Final   Influenza B NOT DETECTED NOT DETECTED Final   Parainfluenza Virus 1 NOT DETECTED NOT DETECTED Final   Parainfluenza Virus 2 NOT DETECTED NOT DETECTED Final   Parainfluenza Virus 3 NOT DETECTED NOT DETECTED Final   Parainfluenza Virus 4 DETECTED (A) NOT DETECTED Final   Respiratory Syncytial Virus NOT DETECTED NOT DETECTED Final   Bordetella pertussis NOT DETECTED NOT DETECTED Final   Bordetella Parapertussis NOT DETECTED NOT DETECTED Final   Chlamydophila pneumoniae NOT DETECTED NOT DETECTED Final   Mycoplasma pneumoniae NOT DETECTED NOT DETECTED Final    Comment: Performed at [facility], [address], [place], [place] [address]    Coagulation Studies: Recent Labs    07/05/23 2347  LABPROT 16.0*  INR 1.3*    Urinalysis: Recent Labs    07/06/23 0244  COLORURINE YELLOW*  LABSPEC 1.023  PHURINE 6.0  GLUCOSEU NEGATIVE  HGBUR SMALL*  BILIRUBINUR NEGATIVE  KETONESUR NEGATIVE  PROTEINUR >=300*  NITRITE NEGATIVE  LEUKOCYTESUR NEGATIVE      Imaging: No results found.    Medications:      aspirin  81 mg  Oral Daily   donepezil  10 mg Oral QHS   entecavir  0.5 mg Oral Q3 days   heparin  5,000 Units Subcutaneous Q8H   hydrocerin   Topical BID   ipratropium-albuterol  3 mL Nebulization QID   latanoprost  1 drop Both Eyes QHS   magnesium oxide  400 mg Oral Daily   memantine  5 mg Oral BID   Netarsudil Dimesylate  1 drop Both Eyes QHS   pantoprazole  40 mg Oral Daily   simvastatin  10 mg Oral QHS   acetaminophen **OR** acetaminophen, ondansetron **OR** ondansetron (ZOFRAN) IV  Assessment/ Plan:  [name] is a 72 y.o.  male with past medical history of hypertension, dementia, multiple myeloma, chronic hepatitis B, glaucoma, and  end-stage renal disease on hemodialysis.  Patient presents to the emergency department from his skilled nursing facility with complaints of nausea, vomiting, and altered mental status.  Patient has been admitted for Nausea vomiting and diarrhea [R11.2, R19.7] Sepsis (HCC) [A41.9] Acute sepsis (HCC) [A41.9]   End-stage renal disease on hemodialysis.  Patient received dialysis today, UF 1.5 L achieved.  Next treatment scheduled for Friday.  2. Anemia of chronic kidney disease Lab Results  Component Value Date   HGB 10.5 (L) 07/08/2023    Hemoglobin at goal.  No need for Epogen at the moment.  3. Secondary Hyperparathyroidism: with outpatient labs: PTH 381, phosphorus 3.8, calcium 8.1 on 06/22/23.   Lab Results  Component Value Date   PTH 101 (H) 07/26/2018   CALCIUM 7.5 (L) 07/08/2023   PHOS 2.6 07/30/2018    Will continue to monitor during this admission.  4.  Hypertension with chronic kidney disease.  Home regimen includes carvedilol and torsemide.  Patient also receives midodrine on dialysis days only.  Blood pressure remained stable during dialysis.   LOS: 2 Quillan Whitter 12/26/20243:30 PM

## 2023-07-08 NOTE — TOC Transition Note (Signed)
Transition of Care [facility]) - Discharge Note   Patient Details  Name: Anthony Houston MRN: [id number] Date of Birth: Dec 29, 1950  Transition [facility]) CM/SW Contact:  Allena Katz, LCSW Phone Number: 07/08/2023, 2:19 PM   Clinical Narrative:   Pt to discharge back to [facility]. Amalia Hailey states pt was active with [facility] CSW wll message sheila to let her know pt is discharging. Medical necessity printed to the unit.     Final next level of care: Assisted Living Barriers to Discharge: Barriers Resolved   Patient Goals and CMS Choice Patient states their goals for this hospitalization and ongoing recovery are:: return to [facility] CMS Medicare.gov Compare Post Acute Care list provided to:: Patient        Discharge Placement                Patient to be transferred to facility by: [facility]      Discharge Plan and Services Additional resources added to the After Visit Summary for                                       Social Drivers of Health (SDOH) Interventions SDOH Screenings   Food Insecurity: No Food Insecurity (07/06/2023)  Housing: Low Risk  (07/06/2023)  Transportation Needs: No Transportation Needs (07/06/2023)  Utilities: Not At Risk (07/06/2023)  Tobacco Use: Low Risk  (07/05/2023)     Readmission Risk Interventions     No data to display

## 2023-07-08 NOTE — Progress Notes (Signed)
Hemodialysis Note:  Received patient in bed to unit. Oriented to person. Informed consent singed and in chart.  Treatment initiated: 0921 Treatment completed: 1302  Patient tolerated well. Transported back to room, alert without acute distress. Report given to patient's RN.  Total UF removed: 1.5L   Post HD weight 101.9 kg  [facility]

## 2023-07-08 NOTE — NC FL2 (Signed)
[place] MEDICAID FL2 LEVEL OF CARE FORM     IDENTIFICATION  Patient Name: Anthony Houston Birthdate: 06/17/1951 Sex: male Admission Date (Current Location): 07/05/2023  [facility] and [place] Number:  Chiropodist and Address:  [facility], [address] [place], [place] [address]      Provider Number: [id number]  Attending Physician Name and Address:  Darlin Priestly, MD  Relative Name and Phone Number:   Gordy Councilman (Brother)  [phone]    Current Level of Care: Hospital Recommended Level of Care: Assisted Living Facility Prior Approval Number:    Date Approved/Denied:   PASRR Number:    Discharge Plan:      Current Diagnoses: Patient Active Problem List   Diagnosis Date Noted   Acute metabolic encephalopathy 07/06/2023   Sepsis (HCC) 07/06/2023   Vomiting 07/06/2023   Acute respiratory failure with hypoxia (HCC) 12/17/2022   Tobacco abuse counseling 12/17/2022   Dementia (HCC) 12/17/2022   Chronic hepatitis B (HCC) 12/17/2022   Open wound of left foot 12/17/2022   Hypertension 07/26/2018   CAD (coronary artery disease) 07/26/2018   Weakness 07/26/2018   ESRD on hemodialysis (HCC) 07/26/2018    Orientation RESPIRATION BLADDER Height & Weight     Self  Normal Continent Weight: 228 lb 9.9 oz (103.7 kg) Height:  6' (182.9 cm)  BEHAVIORAL SYMPTOMS/MOOD NEUROLOGICAL BOWEL NUTRITION STATUS      Continent    AMBULATORY STATUS COMMUNICATION OF NEEDS Skin     Verbally  (Blisters on lower leg)                       Personal Care Assistance Level of Assistance              Functional Limitations Info             SPECIAL CARE FACTORS FREQUENCY  PT (By licensed PT), OT (By licensed OT)     PT Frequency: 3 times a week OT Frequency: 3 times a week            Contractures Contractures Info: Not present    Additional Factors Info  Code Status, Allergies, Isolation Precautions Code Status Info:  FULL Allergies Info: Lisinprol     Isolation Precautions Info: Droplet precautions     Current Medications (07/08/2023):  This is the current hospital active medication list Current Facility-Administered Medications  Medication Dose Route Frequency Provider Last Rate Last Admin   acetaminophen (TYLENOL) tablet 650 mg  650 mg Oral Q6H PRN Andris Baumann, MD       Or   acetaminophen (TYLENOL) suppository 650 mg  650 mg Rectal Q6H PRN Andris Baumann, MD       aspirin chewable tablet 81 mg  81 mg Oral Daily Lindajo Royal V, MD   81 mg at 07/08/23 1337   donepezil (ARICEPT) tablet 10 mg  10 mg Oral QHS Andris Baumann, MD   10 mg at 07/07/23 2153   entecavir (BARACLUDE) tablet 0.5 mg  0.5 mg Oral Q3 days Andris Baumann, MD       heparin injection 5,000 Units  5,000 Units Subcutaneous Q8H Andris Baumann, MD   5,000 Units at 07/08/23 1336   hydrocerin (EUCERIN) cream   Topical BID Darlin Priestly, MD   Given at 07/08/23 1337   ipratropium-albuterol (DUONEB) 0.5-2.5 (3) MG/3ML nebulizer solution 3 mL  3 mL Nebulization QID Darlin Priestly, MD   3 mL at 07/08/23  1341   latanoprost (XALATAN) 0.005 % ophthalmic solution 1 drop  1 drop Both Eyes QHS Lindajo Royal V, MD   1 drop at 07/07/23 2155   magnesium oxide (MAG-OX) tablet 400 mg  400 mg Oral Daily Lindajo Royal V, MD   400 mg at 07/08/23 1336   memantine (NAMENDA) tablet 5 mg  5 mg Oral BID Andris Baumann, MD   5 mg at 07/08/23 1336   Netarsudil Dimesylate 0.02 % SOLN 1 drop  1 drop Both Eyes QHS Andris Baumann, MD       ondansetron [facility]) tablet 4 mg  4 mg Oral Q6H PRN Andris Baumann, MD       Or   ondansetron [facility]) injection 4 mg  4 mg Intravenous Q6H PRN Andris Baumann, MD       pantoprazole (PROTONIX) EC tablet 40 mg  40 mg Oral Daily Lindajo Royal V, MD   40 mg at 07/08/23 1336   simvastatin (ZOCOR) tablet 10 mg  10 mg Oral QHS Andris Baumann, MD   10 mg at 07/07/23 2153     Discharge Medications: Please see discharge summary for a  list of discharge medications. PAUSE taking these medications     carvedilol 12.5 MG tablet Wait to take this until your doctor or other care provider tells you to start again. Due to low blood pressure. Commonly known as: COREG Take 12.5 mg by mouth at bedtime.    torsemide 20 MG tablet Wait to take this until your doctor or other care provider tells you to start again. Due to low blood pressure. Commonly known as: DEMADEX Take 80 mg by mouth daily.           TAKE these medications     acetaminophen 325 MG tablet Commonly known as: TYLENOL Take 650 mg by mouth every 4 (four) hours as needed for mild pain (pain score 1-3).    aspirin 81 MG chewable tablet Chew 81 mg by mouth daily.    Combigan 0.2-0.5 % ophthalmic solution Generic drug: brimonidine-timolol Place 1 drop into both eyes 2 (two) times daily.    diclofenac sodium 1 % Gel Commonly known as: VOLTAREN Apply 4 g topically 4 (four) times daily.    donepezil 10 MG tablet Commonly known as: ARICEPT Take 10 mg by mouth at bedtime.    entecavir 0.5 MG tablet Commonly known as: BARACLUDE Take 0.5 mg by mouth every 3 (three) days.    ipratropium-albuterol 0.5-2.5 (3) MG/3ML Soln Commonly known as: DUONEB Inhale 3 mLs into the lungs 3 (three) times daily.    loperamide 2 MG tablet Commonly known as: IMODIUM A-D Take 4 mg by mouth 3 (three) times daily as needed for diarrhea or loose stools.    Lumigan 0.01 % Soln Generic drug: bimatoprost Place 1 drop into both eyes at bedtime.    magnesium oxide 400 (240 Mg) MG tablet Commonly known as: MAG-OX Take 400 mg by mouth daily.    memantine 5 MG tablet Commonly known as: NAMENDA Take 5 mg by mouth 2 (two) times daily.    midodrine 10 MG tablet Commonly known as: PROAMATINE Take 10 mg by mouth Every Tuesday,Thursday,and Saturday with dialysis.    Minerin Creme Crea Apply 1 application topically 2 (two) times daily.    Oyster Shell 500 MG Tabs Take 1,000  mg by mouth daily.    pantoprazole 40 MG tablet Commonly known as: PROTONIX Take 40 mg by mouth daily.  Phospha 250 Neutral [id number] MG Tabs Take 1 tablet by mouth daily.    Rhopressa 0.02 % Soln Generic drug: Netarsudil Dimesylate Place 1 drop into both eyes at bedtime.    simvastatin 10 MG tablet Commonly known as: ZOCOR Take 10 mg by mouth at bedtime.        Relevant Imaging Results:  Relevant Lab Results:   Additional Information [facility]. Outpatient HD.  Iviana Blasingame Genelle Gather, LCSW

## 2023-07-08 NOTE — Plan of Care (Signed)
  Problem: Clinical Measurements: Goal: Signs and symptoms of infection will decrease Outcome: Progressing   Problem: Education: Goal: Knowledge of General Education information will improve Description: Including pain rating scale, medication(s)/side effects and non-pharmacologic comfort measures Outcome: Progressing   Problem: Health Behavior/Discharge Planning: Goal: Ability to manage health-related needs will improve Outcome: Progressing   Problem: Clinical Measurements: Goal: Will remain free from infection Outcome: Progressing Goal: Diagnostic test results will improve Outcome: Progressing Goal: Respiratory complications will improve Outcome: Progressing Goal: Cardiovascular complication will be avoided Outcome: Progressing   Problem: Nutrition: Goal: Adequate nutrition will be maintained Outcome: Progressing   Problem: Coping: Goal: Level of anxiety will decrease Outcome: Progressing   Problem: Elimination: Goal: Will not experience complications related to urinary retention Outcome: Progressing   Problem: Pain Management: Goal: General experience of comfort will improve Outcome: Progressing   Problem: Safety: Goal: Ability to remain free from injury will improve Outcome: Progressing   Problem: Skin Integrity: Goal: Risk for impaired skin integrity will decrease Outcome: Progressing

## 2023-07-08 NOTE — Discharge Summary (Signed)
Physician Discharge Summary   KESHONE YETTER  male DOB: Apr 07, 1951  MVH:[id number]  PCP: Pcp, No  Admit date: 07/05/2023 Discharge date: 07/08/2023  Admitted From: ALF Disposition:  ALF Home Health: Yes CODE STATUS: Full code  Discharge Instructions     Discharge wound care:   Complete by: As directed    Clean R lower leg full thickness ulcer with NS, apply Xeroform gauze [place] [phone])  daily to wound bed, cover with silicone foam. [facility] Course:  For full details, please see H&P, progress notes, consult notes and ancillary notes.  Briefly,  Anthony Houston is a 72 y.o. male with medical history significant for CAD, chronic hepatitis B, ESRD on dialysis, dementia, sent from ALF for vomiting, weakness and altered mental status described as being more confused than his baseline.     * Severe Sepsis (HCC) 2/2 Parainfluenza virus 4 infection Sepsis criteria include fever, tachycardia and tachypnea with lactic acidosis and elevated procalcitonin Imaging unrevealing except for stable cavitary lesion that could be infectious/inflammatory or neoplastic --started on cefepime, vancomycin and Flagyl which were d/c'ed after RVP returned pos for parainfluenza virus infection.     Acute metabolic encephalopathy Secondary to sepsis and parainfluenza viral infection.  Pt was somnolent on presentation.   --mental status much improved on 12/25, and pt was alert, able to answer questions.   Cavitary lesion lingula-stable from June 2024 --outpatient f/u  Chronic hepatitis B (HCC) Continue Baraclude   Dementia (HCC) Continue Namenda and Aricept   ESRD on hemodialysis (HCC) --iHD per nephro   CAD (coronary artery disease) No complaints of chest pain and EKG nonacute --cont ASA and statin --hold coreg due to soft BP   Hypertension, not currently active --hold coreg and torsemide due to soft BP  Full thickness ulcer over R anterior lower leg --wound RN  consulted.  Wound care per order listed above.   Discharge Diagnoses:  Principal Problem:   Sepsis (HCC) Active Problems:   Acute metabolic encephalopathy   Vomiting   Hypertension   CAD (coronary artery disease)   ESRD on hemodialysis (HCC)   Dementia (HCC)   Chronic hepatitis B (HCC)   30 Day Unplanned Readmission Risk Score    Flowsheet Row ED to Hosp-Admission (Current) from 07/05/2023 in [facility]  30 Day Unplanned Readmission Risk Score (%) 22.54 Filed at 07/08/2023 1200       This score is the patient's risk of an unplanned readmission within 30 days of being discharged (0 -100%). The score is based on dignosis, age, lab data, medications, orders, and past utilization.   Low:  0-14.9   Medium: 15-21.9   High: 22-29.9   Extreme: 30 and above         Discharge Instructions:  Allergies as of 07/08/2023       Reactions   Lisinopril Cough        Medication List     PAUSE taking these medications    carvedilol 12.5 MG tablet Wait to take this until your doctor or other care provider tells you to start again. Due to low blood pressure. Commonly known as: COREG Take 12.5 mg by mouth at bedtime.   torsemide 20 MG tablet Wait to take this until your doctor or other care provider tells you to start again. Due to low blood pressure. Commonly known as: DEMADEX Take 80 mg by mouth daily.       TAKE these  medications    acetaminophen 325 MG tablet Commonly known as: TYLENOL Take 650 mg by mouth every 4 (four) hours as needed for mild pain (pain score 1-3).   aspirin 81 MG chewable tablet Chew 81 mg by mouth daily.   Combigan 0.2-0.5 % ophthalmic solution Generic drug: brimonidine-timolol Place 1 drop into both eyes 2 (two) times daily.   diclofenac sodium 1 % Gel Commonly known as: VOLTAREN Apply 4 g topically 4 (four) times daily.   donepezil 10 MG tablet Commonly known as: ARICEPT Take 10 mg by mouth at  bedtime.   entecavir 0.5 MG tablet Commonly known as: BARACLUDE Take 0.5 mg by mouth every 3 (three) days.   ipratropium-albuterol 0.5-2.5 (3) MG/3ML Soln Commonly known as: DUONEB Inhale 3 mLs into the lungs 3 (three) times daily.   loperamide 2 MG tablet Commonly known as: IMODIUM A-D Take 4 mg by mouth 3 (three) times daily as needed for diarrhea or loose stools.   Lumigan 0.01 % Soln Generic drug: bimatoprost Place 1 drop into both eyes at bedtime.   magnesium oxide 400 (240 Mg) MG tablet Commonly known as: MAG-OX Take 400 mg by mouth daily.   memantine 5 MG tablet Commonly known as: NAMENDA Take 5 mg by mouth 2 (two) times daily.   midodrine 10 MG tablet Commonly known as: PROAMATINE Take 10 mg by mouth Every Tuesday,Thursday,and Saturday with dialysis.   Minerin Creme Crea Apply 1 application topically 2 (two) times daily.   Oyster Shell 500 MG Tabs Take 1,000 mg by mouth daily.   pantoprazole 40 MG tablet Commonly known as: PROTONIX Take 40 mg by mouth daily.   Phospha 250 Neutral [phone] MG Tabs Take 1 tablet by mouth daily.   Rhopressa 0.02 % Soln Generic drug: Netarsudil Dimesylate Place 1 drop into both eyes at bedtime.   simvastatin 10 MG tablet Commonly known as: ZOCOR Take 10 mg by mouth at bedtime.               Discharge Care Instructions  (From admission, onward)           Start     Ordered   07/08/23 0000  Discharge wound care:       Comments: Clean R lower leg full thickness ulcer with NS, apply Xeroform gauze [place] [phone])  daily to wound bed, cover with silicone foam. - -   07/08/23 1447             Follow-up Information     PCP Follow up in 1 week(s).                  Allergies  Allergen Reactions   Lisinopril Cough     The results of significant diagnostics from this hospitalization (including imaging, microbiology, ancillary and laboratory) are listed below for reference.    Consultations:   Procedures/Studies: CT ABDOMEN PELVIS W CONTRAST Result Date: 07/06/2023 CLINICAL DATA:  Abdominal pain, fever, weakness, vomiting EXAM: CT ABDOMEN AND PELVIS WITH CONTRAST TECHNIQUE: Multidetector CT imaging of the abdomen and pelvis was performed using the standard protocol following bolus administration of intravenous contrast. RADIATION DOSE REDUCTION: This exam was performed according to the departmental dose-optimization program which includes automated exposure control, adjustment of the mA and/or kV according to patient size and/or use of iterative reconstruction technique. CONTRAST:  OMNIPAQUE IOHEXOL 300 MG/ML  SOLN COMPARISON:  Chest CT 12/17/2022 FINDINGS: Lower chest: Cavitary lesion in the lingula again noted, partially visualized on the 1st image  of today's study. This measures approximately 1.8 cm. When measured in the same planes and at the same level on prior study, this is stable. Ground-glass opacities in the lung bases could reflect atelectasis or scarring. Hepatobiliary: No focal hepatic abnormality. Gallbladder unremarkable. Pancreas: No focal abnormality or ductal dilatation. Spleen: No focal abnormality.  Normal size. Adrenals/Urinary Tract: Extensive bilateral renal cysts compatible with polycystic kidney disease. No stones or hydronephrosis. Urinary bladder is decompressed but appears thick walled. Adrenal glands unremarkable. Stomach/Bowel: Respiratory motion limits evaluation of the bowel. No bowel obstruction. No visible wall thickening. Vascular/Lymphatic: Aortic atherosclerosis. No evidence of aneurysm or adenopathy. Reproductive: No visible focal abnormality. Other: No free fluid or free air. Musculoskeletal: No acute bony abnormality. IMPRESSION: 1. Extensive bilateral renal cysts compatible with polycystic kidney disease. 2. Urinary bladder decompressed but appears thick walled. Recommend correlation with urinalysis to exclude cystitis. 3. Aortic  atherosclerosis. 4. Cavitary lesion in the lingula again noted, partially visualized on the 1st image of today's study. This is stable when measured in the same planes and at the same level on prior study. This could be infectious/inflammatory or neoplastic. Recommend follow-up imaging as described on prior chest CT. Electronically Signed   By: Charlett Nose M.D.   On: 07/06/2023 01:45   DG Chest Port 1 View Result Date: 07/06/2023 CLINICAL DATA:  Questionable sepsis - evaluate for abnormality. Weakness, vomiting. EXAM: PORTABLE CHEST 1 VIEW COMPARISON:  12/17/2022 FINDINGS: Linear densities at the lung bases, likely atelectasis. Heart and mediastinal contours within normal limits. No effusions or acute bony abnormality. IMPRESSION: Bibasilar atelectasis. Electronically Signed   By: Charlett Nose M.D.   On: 07/06/2023 01:42   CT HEAD WO CONTRAST ( ) Result Date: 07/06/2023 CLINICAL DATA:  Altered mental status, weakness, vomiting EXAM: CT HEAD WITHOUT CONTRAST TECHNIQUE: Contiguous axial images were obtained from the base of the skull through the vertex without intravenous contrast. RADIATION DOSE REDUCTION: This exam was performed according to the departmental dose-optimization program which includes automated exposure control, adjustment of the mA and/or kV according to patient size and/or use of iterative reconstruction technique. COMPARISON:  07/25/2018 FINDINGS: Brain: No evidence of acute infarction, hemorrhage, mass, mass effect, or midline shift. No hydrocephalus or extra-axial fluid collection. Advanced cerebral atrophy for age, with ex vacuo dilatation of the ventricles. Commensurate size of the ventricles and sulci. Periventricular white matter changes, likely the sequela of chronic small vessel ischemic disease. Remote lacunar infarct in the left basal ganglia. Vascular: No hyperdense vessel. Skull: Negative for fracture or focal lesion. Sinuses/Orbits: Mucosal thickening in the left maxillary  sinus and ethmoid air cells. No acute finding in the orbits. Other: The mastoid air cells are well aerated. IMPRESSION: No acute intracranial process. Electronically Signed   By: Wiliam Ke M.D.   On: 07/06/2023 01:41      Labs: BNP (last 3 results) No results for input(s): "BNP" in the last 8760 hours. Basic Metabolic Panel: Recent Labs  Lab 07/05/23 2347 07/08/23 0557  NA 137 132*  K 4.6 4.5  CL 95* 94*  CO2 27 22  GLUCOSE 145* 105*  BUN 32* 70*  CREATININE 6.39* 9.99*  CALCIUM 8.9 7.5*  MG  --  1.9   Liver Function Tests: Recent Labs  Lab 07/05/23 2347  AST 24  ALT 22  ALKPHOS 111  BILITOT 1.0  PROT 8.7*  ALBUMIN 3.5   Recent Labs  Lab 07/05/23 2347  LIPASE 33   No results for input(s): "AMMONIA" in the last 168  hours. CBC: Recent Labs  Lab 07/05/23 2347 07/08/23 0557  WBC 9.6 3.9*  NEUTROABS 8.5*  --   HGB 12.6* 10.5*  HCT 39.2 31.6*  MCV 88.7 84.9  PLT 222 175   Cardiac Enzymes: No results for input(s): "CKTOTAL", "CKMB", "CKMBINDEX", "TROPONINI" in the last 168 hours. BNP: Invalid input(s): "POCBNP" CBG: No results for input(s): "GLUCAP" in the last 168 hours. D-Dimer No results for input(s): "DDIMER" in the last 72 hours. Hgb A1c No results for input(s): "HGBA1C" in the last 72 hours. Lipid Profile No results for input(s): "CHOL", "HDL", "LDLCALC", "TRIG", "CHOLHDL", "LDLDIRECT" in the last 72 hours. Thyroid function studies No results for input(s): "TSH", "T4TOTAL", "T3FREE", "THYROIDAB" in the last 72 hours.  Invalid input(s): "FREET3" Anemia work up No results for input(s): "VITAMINB12", "FOLATE", "FERRITIN", "TIBC", "IRON", "RETICCTPCT" in the last 72 hours. Urinalysis    Component Value Date/Time   COLORURINE YELLOW (A) 07/06/2023 0244   APPEARANCEUR HAZY (A) 07/06/2023 0244   APPEARANCEUR Cloudy 03/30/2012 2039   LABSPEC 1.023 07/06/2023 0244   LABSPEC 1.017 03/30/2012 2039   PHURINE 6.0 07/06/2023 0244   GLUCOSEU NEGATIVE  07/06/2023 0244   GLUCOSEU Negative 03/30/2012 2039   HGBUR SMALL (A) 07/06/2023 0244   BILIRUBINUR NEGATIVE 07/06/2023 0244   BILIRUBINUR Negative 03/30/2012 2039   KETONESUR NEGATIVE 07/06/2023 0244   PROTEINUR >=300 (A) 07/06/2023 0244   NITRITE NEGATIVE 07/06/2023 0244   LEUKOCYTESUR NEGATIVE 07/06/2023 0244   LEUKOCYTESUR 3+ 03/30/2012 2039   Sepsis Labs Recent Labs  Lab 07/05/23 2347 07/08/23 0557  WBC 9.6 3.9*   Microbiology Recent Results (from the past 240 hours)  Resp panel by RT-PCR (RSV, Flu A&B, Covid) Anterior Nasal Swab     Status: None   Collection Time: 07/05/23 11:47 PM   Specimen: Anterior Nasal Swab  Result Value Ref Range Status   SARS Coronavirus 2 by RT PCR NEGATIVE NEGATIVE Final    Comment: (NOTE) SARS-CoV-2 target nucleic acids are NOT DETECTED.  The SARS-CoV-2 RNA is generally detectable in upper respiratory specimens during the acute phase of infection. The lowest concentration of SARS-CoV-2 viral copies this assay can detect is 138 copies/mL. A negative result does not preclude SARS-Cov-2 infection and should not be used as the sole basis for treatment or other patient management decisions. A negative result may occur with  improper specimen collection/handling, submission of specimen other than nasopharyngeal swab, presence of viral mutation(s) within the areas targeted by this assay, and inadequate number of viral copies(<138 copies/mL). A negative result must be combined with clinical observations, patient history, and epidemiological information. The expected result is Negative.  Fact Sheet for Patients:  BloggerCourse.com  Fact Sheet for Healthcare Providers:  SeriousBroker.it  This test is no t yet approved or cleared by the [place] FDA and  has been authorized for detection and/or diagnosis of SARS-CoV-2 by FDA under an Emergency Use Authorization (EUA). This EUA will remain   in effect (meaning this test can be used) for the duration of the COVID-19 declaration under Section 564(b)(1) of the Act, 21 U.S.C.section 360bbb-3(b)(1), unless the authorization is terminated  or revoked sooner.       Influenza A by PCR NEGATIVE NEGATIVE Final   Influenza B by PCR NEGATIVE NEGATIVE Final    Comment: (NOTE) The Xpert Xpress SARS-CoV-2/FLU/RSV plus assay is intended as an aid in the diagnosis of influenza from Nasopharyngeal swab specimens and should not be used as a sole basis for treatment. Nasal washings and aspirates are  unacceptable for Xpert Xpress SARS-CoV-2/FLU/RSV testing.  Fact Sheet for Patients: BloggerCourse.com  Fact Sheet for Healthcare Providers: SeriousBroker.it  This test is not yet approved or cleared by the [place] FDA and has been authorized for detection and/or diagnosis of SARS-CoV-2 by FDA under an Emergency Use Authorization (EUA). This EUA will remain in effect (meaning this test can be used) for the duration of the COVID-19 declaration under Section 564(b)(1) of the Act, 21 U.S.C. section 360bbb-3(b)(1), unless the authorization is terminated or revoked.     Resp Syncytial Virus by PCR NEGATIVE NEGATIVE Final    Comment: (NOTE) Fact Sheet for Patients: BloggerCourse.com  Fact Sheet for Healthcare Providers: SeriousBroker.it  This test is not yet approved or cleared by the [place] FDA and has been authorized for detection and/or diagnosis of SARS-CoV-2 by FDA under an Emergency Use Authorization (EUA). This EUA will remain in effect (meaning this test can be used) for the duration of the COVID-19 declaration under Section 564(b)(1) of the Act, 21 U.S.C. section 360bbb-3(b)(1), unless the authorization is terminated or revoked.  Performed at [facility], [address] [place], [place] [address]   Blood  Culture (routine x 2)     Status: None (Preliminary result)   Collection Time: 07/05/23 11:47 PM   Specimen: BLOOD RIGHT ARM  Result Value Ref Range Status   Specimen Description BLOOD RIGHT ARM  Final   Special Requests   Final    BOTTLES DRAWN AEROBIC AND ANAEROBIC Blood Culture adequate volume   Culture   Final    NO GROWTH 3 DAYS Performed at [facility], [address] [place], [place] [address]    Report Status PENDING  Incomplete  Blood Culture (routine x 2)     Status: None (Preliminary result)   Collection Time: 07/05/23 11:47 PM   Specimen: BLOOD RIGHT ARM  Result Value Ref Range Status   Specimen Description BLOOD RIGHT ARM  Final   Special Requests   Final    BOTTLES DRAWN AEROBIC AND ANAEROBIC Blood Culture adequate volume   Culture   Final    NO GROWTH 3 DAYS Performed at [facility], [address], [place], [place] [address]    Report Status PENDING  Incomplete  MRSA Next Gen by PCR, Nasal     Status: Abnormal   Collection Time: 07/06/23  9:11 AM   Specimen: Nasal Mucosa; Nasal Swab  Result Value Ref Range Status   MRSA by PCR Next Gen DETECTED (A) NOT DETECTED Final    Comment: RESULT CALLED TO, READ BACK BY AND VERIFIED WITH: LOGAN RAGGINS AT 1207 ON 07/06/23 BY SS (NOTE) The GeneXpert MRSA Assay (FDA approved for NASAL specimens only), is one component of a comprehensive MRSA colonization surveillance program. It is not intended to diagnose MRSA infection nor to guide or monitor treatment for MRSA infections. Test performance is not FDA approved in patients less than 10 years old. Performed at [facility], [address] [place], [place] [address]   Respiratory (~20 pathogens) panel by PCR     Status: Abnormal   Collection Time: 07/06/23  7:27 PM   Specimen: Nasopharyngeal Swab; Respiratory  Result Value Ref Range Status   Adenovirus NOT DETECTED NOT DETECTED Final   Coronavirus 229E NOT DETECTED NOT DETECTED Final     Comment: (NOTE) The Coronavirus on the Respiratory Panel, DOES NOT test for the novel  Coronavirus (2019 nCoV)    Coronavirus HKU1 NOT DETECTED NOT DETECTED Final  Coronavirus NL63 NOT DETECTED NOT DETECTED Final   Coronavirus OC43 NOT DETECTED NOT DETECTED Final   Metapneumovirus NOT DETECTED NOT DETECTED Final   Rhinovirus / Enterovirus NOT DETECTED NOT DETECTED Final   Influenza A NOT DETECTED NOT DETECTED Final   Influenza B NOT DETECTED NOT DETECTED Final   Parainfluenza Virus 1 NOT DETECTED NOT DETECTED Final   Parainfluenza Virus 2 NOT DETECTED NOT DETECTED Final   Parainfluenza Virus 3 NOT DETECTED NOT DETECTED Final   Parainfluenza Virus 4 DETECTED (A) NOT DETECTED Final   Respiratory Syncytial Virus NOT DETECTED NOT DETECTED Final   Bordetella pertussis NOT DETECTED NOT DETECTED Final   Bordetella Parapertussis NOT DETECTED NOT DETECTED Final   Chlamydophila pneumoniae NOT DETECTED NOT DETECTED Final   Mycoplasma pneumoniae NOT DETECTED NOT DETECTED Final    Comment: Performed at [facility], [address] [place], [place] [address]     Total time spend on discharging this patient, including the last patient exam, discussing the hospital stay, instructions for ongoing care as it relates to all pertinent caregivers, as well as preparing the medical discharge records, prescriptions, and/or referrals as applicable, is 30 minutes.    Darlin Priestly, MD  [facility] 07/08/2023, 2:56 PM

## 2023-07-08 NOTE — Consult Note (Addendum)
WOC Nurse Consult Note: patient has been followed at [facility] for L dorsal foot wound that has since healed, last seen there 01/2023; pink scar tissue noted at time of this visit  Reason for Consult: blisters BLE  Wound type: full thickness R anterior lower leg ? R/t venous insufficiency (no open wounds noted to L lower leg after inspection, just dry scaly skin and healed foot ulcer)  Pressure Injury POA: NA  Measurement: 2 cm x 2 cm x 0.1 cm 100% pink moist  Wound bed: as above  Drainage (amount, consistency, odor) minimal serosanguinous  Periwound: scaly skin and what appear to be evidence of old healed venous ulcerations  Dressing procedure/placement/frequency: Clean bilateral lower legs with soap and water, dry and apply Eucerin cream 2 times daily.  Apply to feet but DO NOT PLACE IN BETWEEN TOES.  Clean R lower leg full thickness ulcer with NS, apply Xeroform gauze [place] [phone])  daily to wound bed, cover with silicone foam.    POC discussed with patient and bedside nurse. WOC team will not follow. Re-consult if further needs arise.   Thank you,    Priscella Mann MSN, RN-BC, [employer] [phone]

## 2023-07-08 NOTE — Progress Notes (Signed)
0700: Verified with the nurse from [facility] in [facility] that patient has been Hep B Ag negative in his previous records.Anthony Houston

## 2023-07-10 LAB — CULTURE, BLOOD (ROUTINE X 2)
Culture: NO GROWTH
Culture: NO GROWTH
Special Requests: ADEQUATE
Special Requests: ADEQUATE

## 2023-07-12 ENCOUNTER — Other Ambulatory Visit: Payer: Self-pay

## 2023-07-12 ENCOUNTER — Emergency Department: Payer: Medicaid Other

## 2023-07-12 ENCOUNTER — Emergency Department
Admission: EM | Admit: 2023-07-12 | Discharge: 2023-07-12 | Disposition: A | Discharge status: Home / Self Care | Payer: Medicaid Other | Attending: Student in an Organized Health Care Education/Training Program | Admitting: Student in an Organized Health Care Education/Training Program

## 2023-07-12 DIAGNOSIS — N186 End stage renal disease: Secondary | ICD-10-CM | POA: Diagnosis not present

## 2023-07-12 DIAGNOSIS — Z992 Dependence on renal dialysis: Secondary | ICD-10-CM | POA: Diagnosis not present

## 2023-07-12 DIAGNOSIS — Z20822 Contact with and (suspected) exposure to covid-19: Secondary | ICD-10-CM | POA: Insufficient documentation

## 2023-07-12 DIAGNOSIS — R0981 Nasal congestion: Secondary | ICD-10-CM | POA: Insufficient documentation

## 2023-07-12 LAB — CBC WITH DIFFERENTIAL/PLATELET
Abs Immature Granulocytes: 0.03 10*3/uL (ref 0.00–0.07)
Basophils Absolute: 0 10*3/uL (ref 0.0–0.1)
Basophils Relative: 0 %
Eosinophils Absolute: 0.3 10*3/uL (ref 0.0–0.5)
Eosinophils Relative: 4 %
HCT: 35.5 % — ABNORMAL LOW (ref 39.0–52.0)
Hemoglobin: 11.3 g/dL — ABNORMAL LOW (ref 13.0–17.0)
Immature Granulocytes: 0 %
Lymphocytes Relative: 24 %
Lymphs Abs: 1.9 10*3/uL (ref 0.7–4.0)
MCH: 27.6 pg (ref 26.0–34.0)
MCHC: 31.8 g/dL (ref 30.0–36.0)
MCV: 86.6 fL (ref 80.0–100.0)
Monocytes Absolute: 0.6 10*3/uL (ref 0.1–1.0)
Monocytes Relative: 8 %
Neutro Abs: 5 10*3/uL (ref 1.7–7.7)
Neutrophils Relative %: 64 %
Platelets: 222 10*3/uL (ref 150–400)
RBC: 4.1 MIL/uL — ABNORMAL LOW (ref 4.22–5.81)
RDW: 16.1 % — ABNORMAL HIGH (ref 11.5–15.5)
WBC: 7.9 10*3/uL (ref 4.0–10.5)
nRBC: 0 % (ref 0.0–0.2)

## 2023-07-12 LAB — COMPREHENSIVE METABOLIC PANEL
ALT: 48 U/L — ABNORMAL HIGH (ref 0–44)
AST: 44 U/L — ABNORMAL HIGH (ref 15–41)
Albumin: 3.5 g/dL (ref 3.5–5.0)
Alkaline Phosphatase: 97 U/L (ref 38–126)
Anion gap: 13 (ref 5–15)
BUN: 45 mg/dL — ABNORMAL HIGH (ref 8–23)
CO2: 26 mmol/L (ref 22–32)
Calcium: 8.5 mg/dL — ABNORMAL LOW (ref 8.9–10.3)
Chloride: 101 mmol/L (ref 98–111)
Creatinine, Ser: 8.91 mg/dL — ABNORMAL HIGH (ref 0.61–1.24)
GFR, Estimated: 6 mL/min — ABNORMAL LOW (ref >60)
Glucose, Bld: 93 mg/dL (ref 70–99)
Potassium: 5.4 mmol/L — ABNORMAL HIGH (ref 3.5–5.1)
Sodium: 140 mmol/L (ref 135–145)
Total Bilirubin: 0.7 mg/dL (ref 0.0–1.2)
Total Protein: 8.1 g/dL (ref 6.5–8.1)

## 2023-07-12 LAB — RESP PANEL BY RT-PCR (RSV, FLU A&B, COVID)  RVPGX2
Influenza A by PCR: NEGATIVE
Influenza B by PCR: NEGATIVE
Resp Syncytial Virus by PCR: NEGATIVE
SARS Coronavirus 2 by RT PCR: NEGATIVE

## 2023-07-12 MED ORDER — PATIROMER SORBITEX CALCIUM 8.4 G PO PACK: 16.8000 g | PACK | Freq: Every day | ORAL | Status: DC

## 2023-07-12 MED ORDER — PATIROMER SORBITEX CALCIUM 8.4 G PO PACK
16.8000 g | PACK | Freq: Once | ORAL | Status: AC
  Administered 2023-07-12: 16.8 g via ORAL
  Filled 2023-07-12: qty 2

## 2023-07-12 NOTE — ED Notes (Signed)
Report called to [facility] [place], ned necessity completed and waiting on ambulance to arrive to transport patient home.

## 2023-07-12 NOTE — ED Provider Notes (Signed)
[facility] Provider Note    Event Date/Time   First MD Initiated Contact with Patient 07/12/23 1703     (approximate)   History   Nasal Congestion   HPI  Anthony Houston is a 72 y.o. male who presents to the ER for evaluation of nasal congestion.  Patient was recently admitted to the hospital for sepsis found to have paraflu discharge home back to facility.  He is end-stage renal disease on dialysis states that he is due for dialysis tomorrow.  He denies any complaints at this time.     Physical Exam   Triage Vital Signs: ED Triage Vitals  Encounter Vitals Group     BP 07/12/23 1551 (!) 167/87     Systolic BP Percentile --      Diastolic BP Percentile --      Pulse Rate 07/12/23 1551 65     Resp 07/12/23 1551 17     Temp 07/12/23 1551 98.4 F (36.9 C)     Temp Source 07/12/23 1551 Oral     SpO2 07/12/23 1551 95 %     Weight --      Height --      Head Circumference --      Peak Flow --      Pain Score 07/12/23 1552 0     Pain Loc --      Pain Education --      Exclude from Growth Chart --     Most recent vital signs: Vitals:   07/12/23 1551  BP: (!) 167/87  Pulse: 65  Resp: 17  Temp: 98.4 F (36.9 C)  SpO2: 95%     Constitutional: Alert  Eyes: Conjunctivae are normal.  Head: Atraumatic. Mouth/Throat: Mucous membranes are moist.   Neck: Painless ROM.  Cardiovascular:   Good peripheral circulation. Respiratory: Normal respiratory effort.  No retractions.  Occasional scattered wheeze but good air movement throughout. Gastrointestinal: Soft and nontender.  Musculoskeletal:  no deformity Skin:  Skin is warm, dry  Psychiatric: calm and cooperative    ED Results / Procedures / Treatments   Labs (all labs ordered are listed, but only abnormal results are displayed) Labs Reviewed  CBC WITH DIFFERENTIAL/PLATELET - Abnormal; Notable for the following components:      Result Value   RBC 4.10 (*)    Hemoglobin 11.3 (*)     HCT 35.5 (*)    RDW 16.1 (*)    All other components within normal limits  COMPREHENSIVE METABOLIC PANEL - Abnormal; Notable for the following components:   Potassium 5.4 (*)    BUN 45 (*)    Creatinine, Ser 8.91 (*)    Calcium 8.5 (*)    AST 44 (*)    ALT 48 (*)    GFR, Estimated 6 (*)    All other components within normal limits  RESP PANEL BY RT-PCR (RSV, FLU A&B, COVID)  RVPGX2  URINALYSIS, ROUTINE W REFLEX MICROSCOPIC     EKG     RADIOLOGY Please see ED Course for my review and interpretation.  I personally reviewed all radiographic images ordered to evaluate for the above acute complaints and reviewed radiology reports and findings.  These findings were personally discussed with the patient.  Please see medical record for radiology report.    PROCEDURES:  Critical Care performed: No  Procedures   MEDICATIONS ORDERED IN ED: Medications  patiromer Lelon Perla) packet 16.8 g (has no administration in time range)  IMPRESSION / MDM / ASSESSMENT AND PLAN / ED COURSE  I reviewed the triage vital signs and the nursing notes.                              Differential diagnosis includes, but is not limited to, sepsis, URI, electrolyte abnormality, anemia Patient presenting to the ER for evaluation of symptoms as described above.  Based on symptoms, risk factors and considered above differential, this presenting complaint could reflect a potentially life-threatening illness therefore the patient will be placed on continuous pulse oximetry and telemetry for monitoring.  Laboratory evaluation will be sent to evaluate for the above complaints.      Clinical Course as of 07/12/23 1937  Mon Jul 12, 2023  1842 Chest x-ray on my review and interpretation without evidence of consolidation. [PR]    Clinical Course User Index [PR] Willy Eddy, MD   Noted to have borderline hyperkalemia.  He is otherwise asymptomatic.  Does appear appropriate for close outpatient  follow-up is due for dialysis tomorrow.  As he is asymptomatic and has no other complaints with a benign exam will give [place] and discharge back to facility.   FINAL CLINICAL IMPRESSION(S) / ED DIAGNOSES   Final diagnoses:  Nasal congestion  ESRD on dialysis [facility])     Rx / DC Orders   ED Discharge Orders     None        Note:  This document was prepared using Dragon voice recognition software and may include unintentional dictation errors.    Willy Eddy, MD 07/12/23 [phone]

## 2023-07-12 NOTE — ED Triage Notes (Signed)
Pt comes via EMS from [place] [facility] [place] with c/o not feeling well. Facility requested transport for pt. Pt did have some congestion and was here on 12/23 and unknown for what complaint.   VSS

## 2023-07-12 NOTE — ED Triage Notes (Addendum)
Pt presents to ED with no complaints, pt has runny nose in triage. Pt awake and alert in triage. Pt A&Ox4. NAD noted. Pt denies pain.

## 2023-07-12 NOTE — ED Notes (Signed)
Called [facility], spoke with rep. Kellie Shropshire rep stated he would put pt on the list to be picked up next.

## 2024-06-05 ENCOUNTER — Other Ambulatory Visit: Payer: Self-pay

## 2024-06-05 ENCOUNTER — Inpatient Hospital Stay
Admission: EM | Admit: 2024-06-05 | Discharge: 2024-06-19 | DRG: 474 | Disposition: A | Discharge status: Skilled Nursing Facility | Source: Skilled Nursing Facility | Attending: Student | Admitting: Student

## 2024-06-05 ENCOUNTER — Emergency Department

## 2024-06-05 DIAGNOSIS — M85871 Other specified disorders of bone density and structure, right ankle and foot: Secondary | ICD-10-CM | POA: Diagnosis present

## 2024-06-05 DIAGNOSIS — I2510 Atherosclerotic heart disease of native coronary artery without angina pectoris: Secondary | ICD-10-CM | POA: Diagnosis present

## 2024-06-05 DIAGNOSIS — M86171 Other acute osteomyelitis, right ankle and foot: Principal | ICD-10-CM | POA: Diagnosis present

## 2024-06-05 DIAGNOSIS — D631 Anemia in chronic kidney disease: Secondary | ICD-10-CM | POA: Diagnosis present

## 2024-06-05 DIAGNOSIS — Z6838 Body mass index (BMI) 38.0-38.9, adult: Secondary | ICD-10-CM

## 2024-06-05 DIAGNOSIS — E875 Hyperkalemia: Secondary | ICD-10-CM | POA: Diagnosis present

## 2024-06-05 DIAGNOSIS — H409 Unspecified glaucoma: Secondary | ICD-10-CM | POA: Diagnosis present

## 2024-06-05 DIAGNOSIS — E669 Obesity, unspecified: Secondary | ICD-10-CM | POA: Diagnosis present

## 2024-06-05 DIAGNOSIS — L089 Local infection of the skin and subcutaneous tissue, unspecified: Principal | ICD-10-CM | POA: Diagnosis present

## 2024-06-05 DIAGNOSIS — Z7982 Long term (current) use of aspirin: Secondary | ICD-10-CM

## 2024-06-05 DIAGNOSIS — B182 Chronic viral hepatitis C: Secondary | ICD-10-CM | POA: Diagnosis present

## 2024-06-05 DIAGNOSIS — I120 Hypertensive chronic kidney disease with stage 5 chronic kidney disease or end stage renal disease: Secondary | ICD-10-CM | POA: Diagnosis present

## 2024-06-05 DIAGNOSIS — B965 Pseudomonas (aeruginosa) (mallei) (pseudomallei) as the cause of diseases classified elsewhere: Secondary | ICD-10-CM | POA: Diagnosis present

## 2024-06-05 DIAGNOSIS — Z85831 Personal history of malignant neoplasm of soft tissue: Secondary | ICD-10-CM

## 2024-06-05 DIAGNOSIS — N186 End stage renal disease: Secondary | ICD-10-CM | POA: Diagnosis present

## 2024-06-05 DIAGNOSIS — Z89511 Acquired absence of right leg below knee: Secondary | ICD-10-CM

## 2024-06-05 DIAGNOSIS — C9000 Multiple myeloma not having achieved remission: Secondary | ICD-10-CM | POA: Diagnosis present

## 2024-06-05 DIAGNOSIS — B9689 Other specified bacterial agents as the cause of diseases classified elsewhere: Secondary | ICD-10-CM | POA: Diagnosis present

## 2024-06-05 DIAGNOSIS — I872 Venous insufficiency (chronic) (peripheral): Secondary | ICD-10-CM | POA: Diagnosis present

## 2024-06-05 DIAGNOSIS — M7732 Calcaneal spur, left foot: Secondary | ICD-10-CM | POA: Diagnosis present

## 2024-06-05 DIAGNOSIS — K219 Gastro-esophageal reflux disease without esophagitis: Secondary | ICD-10-CM | POA: Diagnosis present

## 2024-06-05 DIAGNOSIS — B181 Chronic viral hepatitis B without delta-agent: Secondary | ICD-10-CM | POA: Diagnosis present

## 2024-06-05 DIAGNOSIS — I10 Essential (primary) hypertension: Secondary | ICD-10-CM | POA: Diagnosis present

## 2024-06-05 DIAGNOSIS — E538 Deficiency of other specified B group vitamins: Secondary | ICD-10-CM | POA: Diagnosis present

## 2024-06-05 DIAGNOSIS — L97412 Non-pressure chronic ulcer of right heel and midfoot with fat layer exposed: Secondary | ICD-10-CM | POA: Diagnosis present

## 2024-06-05 DIAGNOSIS — B961 Klebsiella pneumoniae [K. pneumoniae] as the cause of diseases classified elsewhere: Secondary | ICD-10-CM | POA: Diagnosis present

## 2024-06-05 DIAGNOSIS — Z79899 Other long term (current) drug therapy: Secondary | ICD-10-CM

## 2024-06-05 DIAGNOSIS — Z992 Dependence on renal dialysis: Secondary | ICD-10-CM

## 2024-06-05 DIAGNOSIS — I70201 Unspecified atherosclerosis of native arteries of extremities, right leg: Secondary | ICD-10-CM | POA: Diagnosis present

## 2024-06-05 DIAGNOSIS — E66812 Obesity, class 2: Secondary | ICD-10-CM | POA: Diagnosis present

## 2024-06-05 DIAGNOSIS — F0390 Unspecified dementia without behavioral disturbance: Secondary | ICD-10-CM | POA: Diagnosis present

## 2024-06-05 DIAGNOSIS — R197 Diarrhea, unspecified: Secondary | ICD-10-CM | POA: Diagnosis present

## 2024-06-05 DIAGNOSIS — L97509 Non-pressure chronic ulcer of other part of unspecified foot with unspecified severity: Secondary | ICD-10-CM | POA: Diagnosis present

## 2024-06-05 DIAGNOSIS — Z888 Allergy status to other drugs, medicaments and biological substances status: Secondary | ICD-10-CM

## 2024-06-05 LAB — PROTIME-INR
INR: 1.1 (ref 0.8–1.2)
Prothrombin Time: 15.3 s — ABNORMAL HIGH (ref 11.4–15.2)

## 2024-06-05 LAB — COMPREHENSIVE METABOLIC PANEL WITH GFR
ALT: 11 U/L (ref 0–44)
AST: 24 U/L (ref 15–41)
Albumin: 3.6 g/dL (ref 3.5–5.0)
Alkaline Phosphatase: 89 U/L (ref 38–126)
Anion gap: 13 (ref 5–15)
BUN: 29 mg/dL — ABNORMAL HIGH (ref 8–23)
CO2: 27 mmol/L (ref 22–32)
Calcium: 9.3 mg/dL (ref 8.9–10.3)
Chloride: 99 mmol/L (ref 98–111)
Creatinine, Ser: 6.96 mg/dL — ABNORMAL HIGH (ref 0.61–1.24)
GFR, Estimated: 8 mL/min — ABNORMAL LOW (ref >60)
Glucose, Bld: 79 mg/dL (ref 70–99)
Potassium: 4.4 mmol/L (ref 3.5–5.1)
Sodium: 140 mmol/L (ref 135–145)
Total Bilirubin: 0.4 mg/dL (ref 0.0–1.2)
Total Protein: 8.3 g/dL — ABNORMAL HIGH (ref 6.5–8.1)

## 2024-06-05 LAB — CBC WITH DIFFERENTIAL/PLATELET
Abs Immature Granulocytes: 0.02 K/uL (ref 0.00–0.07)
Basophils Absolute: 0 K/uL (ref 0.0–0.1)
Basophils Relative: 1 %
Eosinophils Absolute: 0.2 K/uL (ref 0.0–0.5)
Eosinophils Relative: 3 %
HCT: 31.2 % — ABNORMAL LOW (ref 39.0–52.0)
Hemoglobin: 9.8 g/dL — ABNORMAL LOW (ref 13.0–17.0)
Immature Granulocytes: 0 %
Lymphocytes Relative: 18 %
Lymphs Abs: 1.6 K/uL (ref 0.7–4.0)
MCH: 27.6 pg (ref 26.0–34.0)
MCHC: 31.4 g/dL (ref 30.0–36.0)
MCV: 87.9 fL (ref 80.0–100.0)
Monocytes Absolute: 0.8 K/uL (ref 0.1–1.0)
Monocytes Relative: 9 %
Neutro Abs: 6 K/uL (ref 1.7–7.7)
Neutrophils Relative %: 69 %
Platelets: 329 K/uL (ref 150–400)
RBC: 3.55 MIL/uL — ABNORMAL LOW (ref 4.22–5.81)
RDW: 15.1 % (ref 11.5–15.5)
WBC: 8.7 K/uL (ref 4.0–10.5)
nRBC: 0 % (ref 0.0–0.2)

## 2024-06-05 LAB — APTT: aPTT: 42 s — ABNORMAL HIGH (ref 24–36)

## 2024-06-05 LAB — LACTIC ACID, PLASMA: Lactic Acid, Venous: 1.1 mmol/L (ref 0.5–1.9)

## 2024-06-05 LAB — SEDIMENTATION RATE: Sed Rate: 126 mm/h — ABNORMAL HIGH (ref 0–20)

## 2024-06-05 MED ORDER — ONDANSETRON HCL 4 MG/2ML IJ SOLN: 4.0000 mg | Freq: Three times a day (TID) | INTRAMUSCULAR | Status: DC | PRN

## 2024-06-05 MED ORDER — HYDRALAZINE HCL 20 MG/ML IJ SOLN: 5.0000 mg | INTRAMUSCULAR | Status: DC | PRN

## 2024-06-05 MED ORDER — ACETAMINOPHEN 325 MG PO TABS: 650.0000 mg | ORAL_TABLET | Freq: Four times a day (QID) | ORAL | Status: DC | PRN

## 2024-06-05 MED ORDER — VANCOMYCIN HCL 1250 MG/250ML IV SOLN
1250.0000 mg | Freq: Once | INTRAVENOUS | Status: AC
  Administered 2024-06-06: 1250 mg via INTRAVENOUS
  Filled 2024-06-05 (×2): qty 250

## 2024-06-05 MED ORDER — VANCOMYCIN HCL 1250 MG/250ML IV SOLN
1250.0000 mg | Freq: Once | INTRAVENOUS | Status: AC
  Administered 2024-06-06: 1250 mg via INTRAVENOUS
  Filled 2024-06-05: qty 250

## 2024-06-05 MED ORDER — PIPERACILLIN-TAZOBACTAM 3.375 G IVPB 30 MIN
3.3750 g | Freq: Once | INTRAVENOUS | Status: AC
  Administered 2024-06-05: 3.375 g via INTRAVENOUS
  Filled 2024-06-05: qty 50

## 2024-06-05 NOTE — H&P (Incomplete)
 History and Physical    Anthony Houston FMW:[id number] DOB: August 12, 1950 DOA: 06/05/2024  Referring MD/NP/PA:   PCP: Pcp, No   Patient coming from:  The patient is coming from home.     Chief Complaint:   HPI: Anthony Houston is a 73 y.o. male with medical history significant of      Data reviewed independently and ED Course: pt was found to have     ***       EKG: I have personally reviewed.  Not done in ED, will get one.   ***   Review of Systems:   General: no fevers, chills, no body weight gain, has poor appetite, has fatigue HEENT: no blurry vision, hearing changes or sore throat Respiratory: no dyspnea, coughing, wheezing CV: no chest pain, no palpitations GI: no nausea, vomiting, abdominal pain, diarrhea, constipation GU: no dysuria, burning on urination, increased urinary frequency, hematuria  Ext: no leg edema Neuro: no unilateral weakness, numbness, or tingling, no vision change or hearing loss Skin: no rash, no skin tear. MSK: No muscle spasm, no deformity, no limitation of range of movement in spin Heme: No easy bruising.  Travel history: No recent long distant travel.   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   Glaucoma    Hypertension    Knee pain, left    Multiple myeloma (HCC)     Past Surgical History:  Procedure Laterality Date   ABDOMINAL SURGERY  w/in 10 years   to remove cancer    DIALYSIS/PERMA CATHETER INSERTION N/A 07/26/2018   Procedure: DIALYSIS/PERMA CATHETER INSERTION;  Surgeon: Jama Cordella MATSU, MD;  Location: [facility] INVASIVE CV LAB;  Service: Cardiovascular;  Laterality: N/A;   DIALYSIS/PERMA CATHETER INSERTION N/A 05/05/2019   Procedure: DIALYSIS/PERMA CATHETER EXCHANGE;  Surgeon: Jama Cordella MATSU, MD;  Location: [facility] INVASIVE CV LAB;  Service: Cardiovascular;  Laterality: N/A;   NO PAST SURGERIES     PERIPHERAL VASCULAR THROMBECTOMY Left 05/05/2019   Procedure: PERIPHERAL VASCULAR THROMBECTOMY;  Surgeon: Jama Cordella MATSU, MD;  Location: [facility] INVASIVE CV LAB;  Service: Cardiovascular;  Laterality: Left;    Social History:  reports that he has never smoked. He has never used smokeless tobacco. He reports that he does not currently use alcohol. He reports that he does not use drugs.  Family History:  Family History  Problem Relation Age of Onset   Varicose Veins Neg Hx      Prior to Admission medications   Medication Sig Start Date End Date Taking? Authorizing Provider  acetaminophen  (TYLENOL ) 325 MG tablet Take 650 mg by mouth every 4 (four) hours as needed for mild pain (pain score 1-3).    [provider]  aspirin  81 MG chewable tablet Chew 81 mg by mouth daily.    [provider]  brimonidine -timolol  (COMBIGAN ) 0.2-0.5 % ophthalmic solution Place 1 drop into both eyes 2 (two) times daily.    [provider]  carvedilol  (COREG ) 12.5 MG tablet Take 12.5 mg by mouth at bedtime.    [provider]  diclofenac sodium (VOLTAREN) 1 % GEL Apply 4 g topically 4 (four) times daily.    [provider]  donepezil  (ARICEPT ) 10 MG tablet Take 10  mg by mouth at bedtime.    [provider]  entecavir  (BARACLUDE ) 0.5 MG tablet Take 0.5 mg by mouth every 3 (three) days.    [provider]  ipratropium-albuterol  (DUONEB) 0.5-2.5 (3) MG/3ML SOLN Inhale 3 mLs into the lungs 3 (three) times daily.    [provider]  K Phos  Mono-Sod Phos Di & Mono (PHOSPHA 250 NEUTRAL) 155-852-130 MG TABS Take 1 tablet by mouth daily. 06/19/22   [provider]  loperamide (IMODIUM A-D) 2 MG tablet Take 4 mg by mouth 3 (three) times daily as needed for diarrhea or loose stools.    [provider]  LUMIGAN 0.01 % SOLN Place 1  drop into both eyes at bedtime. 11/24/22   [provider]  magnesium  oxide (MAG-OX) 400 (240 Mg) MG tablet Take 400 mg by mouth daily.    [provider]  memantine  (NAMENDA ) 5 MG tablet Take 5 mg by mouth 2 (two) times daily. 12/02/22   [provider]  midodrine (PROAMATINE) 10 MG tablet Take 10 mg by mouth Every Tuesday,Thursday,and Saturday with dialysis. 06/16/23   [provider]  Oyster Shell 500 MG TABS Take 1,000 mg by mouth daily.    [provider]  pantoprazole  (PROTONIX ) 40 MG tablet Take 40 mg by mouth daily.    [provider]  RHOPRESSA 0.02 % SOLN Place 1 drop into both eyes at bedtime. 10/27/22   [provider]  simvastatin  (ZOCOR ) 10 MG tablet Take 10 mg by mouth at bedtime.    [provider]  Skin Protectants, Misc. (MINERIN) CREA Apply 1 application topically 2 (two) times daily.    [provider]  torsemide  (DEMADEX ) 20 MG tablet Take 80 mg by mouth daily.    [provider]    Physical Exam: Vitals:   06/05/24 2100 06/05/24 2126 06/05/24 2223 06/05/24 2322  BP: (!) 153/109  135/83 133/87  Pulse: 67  66 66  Resp: 18  16 17   Temp:    98 F (36.7 C)  TempSrc:    Oral  SpO2: 98%  98% 99%  Weight:  130.6 kg    Height:  6' 1 (1.854 m)     General: Not in acute distress HEENT:       Eyes: PERRL, EOMI, no jaundice       ENT: No discharge from the ears and nose, no pharynx injection, no tonsillar enlargement.        Neck: No JVD, no bruit, no mass felt. Heme: No neck lymph node enlargement. Cardiac: S1/S2, RRR, No murmurs, No gallops or rubs. Respiratory: No rales, wheezing, rhonchi or rubs. GI: Soft, nondistended, nontender, no rebound pain, no organomegaly, BS present. GU: No hematuria Ext: No pitting leg edema bilaterally. 1+DP/PT pulse bilaterally. Musculoskeletal: No joint deformities, No joint redness or warmth, no limitation of ROM in spin. Skin: No rashes.  Neuro:  Alert, oriented X3, cranial nerves II-XII grossly intact, moves all extremities normally. Muscle strength 5/5 in all extremities, sensation to light touch intact. Brachial reflex 2+ bilaterally. Knee reflex 1+ bilaterally. Negative Babinski's sign. Normal finger to nose test. Psych: Patient is not psychotic, no suicidal or hemocidal ideation.  Labs on Admission: I have personally reviewed following labs and imaging studies  CBC: Recent Labs  Lab 06/05/24 2132  WBC 8.7  NEUTROABS 6.0  HGB 9.8*  HCT 31.2*  MCV 87.9  PLT 329   Basic Metabolic Panel: Recent Labs  Lab 06/05/24 1658  NA 140  K 4.4  CL 99  CO2 27  GLUCOSE 79  BUN 29*  CREATININE 6.96*  CALCIUM  9.3   GFR: Estimated Creatinine Clearance: 13 mL/min (A) (by C-G formula based on SCr of 6.96 mg/dL (H)). Liver Function Tests: Recent Labs  Lab 06/05/24 1658  AST 24  ALT 11  ALKPHOS 89  BILITOT 0.4  PROT 8.3*  ALBUMIN 3.6   No results for input(s): LIPASE, AMYLASE in the last 168 hours. No results for input(s): AMMONIA in the last 168 hours. Coagulation Profile: No results for input(s): INR, PROTIME in the last 168 hours. Cardiac Enzymes: No results for input(s): CKTOTAL, CKMB, CKMBINDEX, TROPONINI in the last 168 hours. BNP (last 3 results) No results for input(s): PROBNP in the last 8760 hours. HbA1C: No results for input(s): HGBA1C in the last 72 hours. CBG: No results for input(s): GLUCAP in the last 168 hours. Lipid Profile: No results for input(s): CHOL, HDL, LDLCALC, TRIG, CHOLHDL, LDLDIRECT in the last 72 hours. Thyroid Function Tests: No results for input(s): TSH, T4TOTAL, FREET4, T3FREE, THYROIDAB in the last 72 hours. Anemia Panel: No results for input(s): VITAMINB12, FOLATE, FERRITIN, TIBC, IRON, RETICCTPCT in the last 72 hours. Urine analysis:    Component Value Date/Time   COLORURINE YELLOW (A) 07/06/2023 0244   APPEARANCEUR HAZY  (A) 07/06/2023 0244   APPEARANCEUR Cloudy 03/30/2012 2039   LABSPEC 1.023 07/06/2023 0244   LABSPEC 1.017 03/30/2012 2039   PHURINE 6.0 07/06/2023 0244   GLUCOSEU NEGATIVE 07/06/2023 0244   GLUCOSEU Negative 03/30/2012 2039   HGBUR SMALL (A) 07/06/2023 0244   BILIRUBINUR NEGATIVE 07/06/2023 0244   BILIRUBINUR Negative 03/30/2012 2039   KETONESUR NEGATIVE 07/06/2023 0244   PROTEINUR >=300 (A) 07/06/2023 0244   NITRITE NEGATIVE 07/06/2023 0244   LEUKOCYTESUR NEGATIVE 07/06/2023 0244   LEUKOCYTESUR 3+ 03/30/2012 2039   Sepsis Labs: @LABRCNTIP (procalcitonin:4,lacticidven:4) )No results found for this or any previous visit (from the past 240 hours).   Radiological Exams on Admission:   Assessment/Plan Active Problems:   * No active hospital problems. *   Assessment and Plan: No notes have been filed under this hospital service. Service: Hospitalist      Active Problems:   * No active hospital problems. *    DVT ppx: SQ Heparin          SQ Lovenox   Code Status: Full code   ***  Family Communication:     not done, no family member is at bed side.              Yes, patient's    at bed side.       by phone   ***  Disposition Plan:  Anticipate discharge back to previous environment  Consults called:    Admission status and Level of care: :    for obs as inpt        Dispo: The patient is from: {From:23814}              Anticipated d/c is to: {To:23815}              Anticipated d/c date is: {Days:23816}              Patient currently {Medically stable:23817}    Severity of Illness:  {Observation/Inpatient:21159}       Date of Service 06/05/2024    Caleb Exon [facility]   If 7PM-7AM, please contact night-coverage [email or web address] 06/05/2024, 11:28 PM

## 2024-06-05 NOTE — ED Notes (Signed)
 ED Provider at bedside.

## 2024-06-05 NOTE — ED Provider Notes (Signed)
 [facility] Provider Note    Event Date/Time   First MD Initiated Contact with Patient 06/05/24 1946     (approximate)   History   Wound Infection  First nurse note: Pt to ED via [facility] from [place] [facility] [place]. Facility reports home health typically changes dressing on feet but they could not remember the last time it has been changed or the last time pt has been bathed.   133/67 99% RA 68 HR  42 ET  22 RR 125 CBG  98.4 oral   Patient to ED from [facility] [place] [place]; wound to right foot with green discharge present.    HPI Anthony Houston is a 73 y.o. male PMH ESRD on dialysis, dementia, open wound of left foot, chronic hep C, CAD presents for evaluation of concern for wound infection - Patient reportedly had change in the drainage from the wound in his right foot.  Says he is not sure the last time his bandage was changed.  Denies any pain or fever. - Unsure date of his last dialysis session       Physical Exam   Triage Vital Signs: ED Triage Vitals [06/05/24 1654]  Encounter Vitals Group     BP (!) 166/79     Girls Systolic BP Percentile      Girls Diastolic BP Percentile      Boys Systolic BP Percentile      Boys Diastolic BP Percentile      Pulse Rate 79     Resp 18     Temp 98.3 F (36.8 C)     Temp Source Oral     SpO2 100 %     Weight      Height      Head Circumference      Peak Flow      Pain Score      Pain Loc      Pain Education      Exclude from Growth Chart     Most recent vital signs: Vitals:   06/05/24 2100 06/05/24 2223  BP: (!) 153/109 135/83  Pulse: 67 66  Resp: 18 16  Temp:    SpO2: 98% 98%     General: Awake, no distress.  CV:  Good peripheral perfusion. RRR, RP 2+. AVF LUE +thrill.  Resp:  Normal effort. CTAB Abd:  No distention. Nontender to deep palpation throughout Other:  Notable swelling of right foot with large wound dorsal aspect with some purulent drainage.  No crepitus.  Some darkening  of skin around this region.  Bedside ultrasound with no obvious fluid pocket.          ED Results / Procedures / Treatments   Labs (all labs ordered are listed, but only abnormal results are displayed) Labs Reviewed  CBC WITH DIFFERENTIAL/PLATELET - Abnormal; Notable for the following components:      Result Value   RBC 3.55 (*)    Hemoglobin 9.8 (*)    HCT 31.2 (*)    All other components within normal limits  COMPREHENSIVE METABOLIC PANEL WITH GFR - Abnormal; Notable for the following components:   BUN 29 (*)    Creatinine, Ser 6.96 (*)    Total Protein 8.3 (*)    GFR, Estimated 8 (*)    All other components within normal limits  SEDIMENTATION RATE - Abnormal; Notable for the following components:   Sed Rate 126 (*)    All other components within normal limits  AEROBIC CULTURE W GRAM STAIN (SUPERFICIAL SPECIMEN)  LACTIC ACID, PLASMA  URINALYSIS, W/ REFLEX TO CULTURE (INFECTION SUSPECTED)  C-REACTIVE PROTEIN     EKG  N/a   RADIOLOGY Radiology interpreted by myself radiology report reviewed.  No obvious acute pathology, send diffuse osteopenia in right foot limits interpretation.    PROCEDURES:  Critical Care performed: No  Procedures   MEDICATIONS ORDERED IN ED: Medications  piperacillin -tazobactam (ZOSYN ) IVPB 3.375 g (has no administration in time range)  vancomycin  (VANCOREADY) IVPB 1250 mg/250 mL (has no administration in time range)    And  vancomycin  (VANCOREADY) IVPB 1250 mg/250 mL (has no administration in time range)     IMPRESSION / MDM / ASSESSMENT AND PLAN / ED COURSE  I reviewed the triage vital signs and the nursing notes.                              DDX/MDM/AP: Differential diagnosis includes, but is not limited to, infected wound of right foot, do not clinically suspect underlying abscess with reassuring bedside ultrasound no obvious fluctuance on exam.  Consider underlying osteomyelitis.  No findings to suggest sepsis at this  time.  Plan: -Labs - X-ray right foot - Anticipate admission  Patient's presentation is most consistent with acute presentation with potential threat to life or bodily function.  The patient is on the cardiac monitor to evaluate for evidence of arrhythmia and/or significant heart rate changes.  ED course below.  Laboratory workup overall reassuring, no leukocytosis, lactate normal.  ESR elevated, no prior for comparison.  X-ray right foot with diffuse osteopenia which limits interpretation of possible osteomyelitis given-MRI right foot ordered (Noncon given patient does still make urine).  Patient is clinically stable and I believe it is reasonable to wait for podiatry evaluation until the morning.  Treating empirically with Vanco, Zosyn .  Admitting to hospitalist service.  Clinical Course as of 06/05/24 2319  Mon Jun 05, 2024  2138 CMP with renal function within baseline range (is on dialysis), potassium normal [MM]  2144 XR L foot: IMPRESSION: 1. Indistinct deformity of the proximal phalanx of the fourth toe, possibly from acute or healed fracture. 2. Soft tissue edema along the dorsal forefoot with a dorsal midfoot skin defect and overlying bandaging. 3. Diffuse bony demineralization. 4. Plantar calcaneal spur.   [MM]  2144 XR R foot: IMPRESSION: 1. Soft tissue swelling with lateral and dorsal soft tissue irregularity, suspicious for ulcers. 2. Diffuse osteopenia, limiting sensitivity for osseous destruction by plain film. Consider pre and postcontrast MRI.   [MM]  2145 CXR: IMPRESSION: 1. No acute cardiopulmonary findings.   [MM]  2152 CBC with no leukocytosis, anemia within baseline range [MM]  2220 Lactate wnl [MM]    Clinical Course User Index [MM] Clarine Ozell LABOR, MD     FINAL CLINICAL IMPRESSION(S) / ED DIAGNOSES   Final diagnoses:  Right foot infection     Rx / DC Orders   ED Discharge Orders     None        Note:  This document was prepared  using Dragon voice recognition software and may include unintentional dictation errors.   Clarine Ozell LABOR, MD 06/05/24 [phone]

## 2024-06-05 NOTE — ED Triage Notes (Signed)
 Patient to ED from [facility]; wound to right foot with green discharge present.

## 2024-06-05 NOTE — ED Notes (Signed)
 ED Provider at bedside with US  IV attempt.

## 2024-06-05 NOTE — Progress Notes (Signed)
 ED Pharmacy Antibiotic Sign Off An antibiotic consult was received from an ED provider for Vancomycin  per pharmacy dosing for wound infection. A chart review was completed to assess appropriateness.   The following one time order(s) were placed:  Vancomycin  2500 mg per pt wt: 130.9 kg  Further antibiotic and/or antibiotic pharmacy consults should be ordered by the admitting provider if indicated.   Thank you for allowing pharmacy to be a part of this patient's care.   Thank you, Rankin CANDIE Dills, PharmD, [facility] 06/05/2024 11:19 PM

## 2024-06-05 NOTE — ED Notes (Signed)
 [facility] contacted by this RN regarding IV Team availability and per [facility] they will not be here all night. MD Mian notified of the need for US  IV placement and attempts made out in triage and also RN without success. Supplies at the bedside when available for placement.

## 2024-06-05 NOTE — ED Notes (Signed)
 Fall bracelet placed on pt and fall alarm turned on. Unable to place fall risk socks due to wound on pt foot.

## 2024-06-05 NOTE — ED Triage Notes (Addendum)
 First nurse note: Pt to ED via [facility] from [place] [facility] [place]. Facility reports home health typically changes dressing on feet but they could not remember the last time it has been changed or the last time pt has been bathed.   133/67 99% RA 68 HR  42 ET  22 RR 125 CBG  98.4 oral

## 2024-06-05 NOTE — ED Notes (Signed)
 This RN felt patient is not appropriate for a hallway bed r/t state of the patients open wounds on foot and availability of appropriate equipment for the nature of patients visit.

## 2024-06-06 ENCOUNTER — Inpatient Hospital Stay

## 2024-06-06 DIAGNOSIS — L089 Local infection of the skin and subcutaneous tissue, unspecified: Principal | ICD-10-CM | POA: Diagnosis present

## 2024-06-06 DIAGNOSIS — E669 Obesity, unspecified: Secondary | ICD-10-CM | POA: Diagnosis present

## 2024-06-06 DIAGNOSIS — R197 Diarrhea, unspecified: Secondary | ICD-10-CM | POA: Diagnosis present

## 2024-06-06 DIAGNOSIS — L97412 Non-pressure chronic ulcer of right heel and midfoot with fat layer exposed: Secondary | ICD-10-CM | POA: Diagnosis present

## 2024-06-06 DIAGNOSIS — D631 Anemia in chronic kidney disease: Secondary | ICD-10-CM | POA: Diagnosis present

## 2024-06-06 LAB — C-REACTIVE PROTEIN: CRP: 18.6 mg/dL — ABNORMAL HIGH (ref <1.0)

## 2024-06-06 LAB — CBC
HCT: 30.1 % — ABNORMAL LOW (ref 39.0–52.0)
Hemoglobin: 9.8 g/dL — ABNORMAL LOW (ref 13.0–17.0)
MCH: 28.1 pg (ref 26.0–34.0)
MCHC: 32.6 g/dL (ref 30.0–36.0)
MCV: 86.2 fL (ref 80.0–100.0)
Platelets: 314 K/uL (ref 150–400)
RBC: 3.49 MIL/uL — ABNORMAL LOW (ref 4.22–5.81)
RDW: 15.1 % (ref 11.5–15.5)
WBC: 9.4 K/uL (ref 4.0–10.5)
nRBC: 0.2 % (ref 0.0–0.2)

## 2024-06-06 LAB — GLUCOSE, CAPILLARY: Glucose-Capillary: 84 mg/dL (ref 70–99)

## 2024-06-06 LAB — BASIC METABOLIC PANEL WITH GFR
Anion gap: 14 (ref 5–15)
BUN: 33 mg/dL — ABNORMAL HIGH (ref 8–23)
CO2: 25 mmol/L (ref 22–32)
Calcium: 8.7 mg/dL — ABNORMAL LOW (ref 8.9–10.3)
Chloride: 101 mmol/L (ref 98–111)
Creatinine, Ser: 7.81 mg/dL — ABNORMAL HIGH (ref 0.61–1.24)
GFR, Estimated: 7 mL/min — ABNORMAL LOW (ref >60)
Glucose, Bld: 100 mg/dL — ABNORMAL HIGH (ref 70–99)
Potassium: 4.5 mmol/L (ref 3.5–5.1)
Sodium: 140 mmol/L (ref 135–145)

## 2024-06-06 LAB — HEPATITIS B SURFACE ANTIGEN: Hepatitis B Surface Ag: NONREACTIVE

## 2024-06-06 MED ORDER — PENTAFLUOROPROP-TETRAFLUOROETH EX AERO
INHALATION_SPRAY | CUTANEOUS | Status: AC
  Filled 2024-06-06: qty 30

## 2024-06-06 MED ORDER — COLLAGENASE 250 UNIT/GM EX OINT
TOPICAL_OINTMENT | Freq: Every day | CUTANEOUS | Status: DC
  Filled 2024-06-06 (×2): qty 30

## 2024-06-06 MED ORDER — DONEPEZIL HCL 5 MG PO TABS
10.0000 mg | ORAL_TABLET | Freq: Every day | ORAL | Status: DC
  Administered 2024-06-06 – 2024-06-18 (×13): 10 mg via ORAL
  Filled 2024-06-06 (×13): qty 2

## 2024-06-06 MED ORDER — OXYCODONE-ACETAMINOPHEN 5-325 MG PO TABS
1.0000 | ORAL_TABLET | ORAL | Status: DC | PRN
  Administered 2024-06-06: 1 via ORAL
  Filled 2024-06-06: qty 1

## 2024-06-06 MED ORDER — HEPARIN SODIUM (PORCINE) 5000 UNIT/ML IJ SOLN
5000.0000 [IU] | Freq: Three times a day (TID) | INTRAMUSCULAR | Status: DC
  Administered 2024-06-06 – 2024-06-19 (×33): 5000 [IU] via SUBCUTANEOUS
  Filled 2024-06-06 (×36): qty 1

## 2024-06-06 MED ORDER — SIMVASTATIN 20 MG PO TABS
10.0000 mg | ORAL_TABLET | Freq: Every day | ORAL | Status: DC
  Administered 2024-06-06 – 2024-06-18 (×13): 10 mg via ORAL
  Filled 2024-06-06 (×13): qty 1

## 2024-06-06 MED ORDER — TIMOLOL MALEATE 0.5 % OP SOLN
1.0000 [drp] | Freq: Two times a day (BID) | OPHTHALMIC | Status: DC
  Administered 2024-06-06 – 2024-06-19 (×24): 1 [drp] via OPHTHALMIC
  Filled 2024-06-06: qty 5

## 2024-06-06 MED ORDER — ASPIRIN 81 MG PO CHEW
81.0000 mg | CHEWABLE_TABLET | Freq: Every day | ORAL | Status: DC
  Administered 2024-06-06 – 2024-06-19 (×12): 81 mg via ORAL
  Filled 2024-06-06 (×12): qty 1

## 2024-06-06 MED ORDER — MEMANTINE HCL 5 MG PO TABS
5.0000 mg | ORAL_TABLET | Freq: Two times a day (BID) | ORAL | Status: DC
  Administered 2024-06-06 – 2024-06-19 (×25): 5 mg via ORAL
  Filled 2024-06-06 (×25): qty 1

## 2024-06-06 MED ORDER — BRIMONIDINE TARTRATE 0.2 % OP SOLN
1.0000 [drp] | Freq: Two times a day (BID) | OPHTHALMIC | Status: DC
  Administered 2024-06-06 – 2024-06-19 (×24): 1 [drp] via OPHTHALMIC
  Filled 2024-06-06: qty 5

## 2024-06-06 MED ORDER — LATANOPROST 0.005 % OP SOLN
1.0000 [drp] | Freq: Every day | OPHTHALMIC | Status: DC
  Administered 2024-06-06 – 2024-06-18 (×13): 1 [drp] via OPHTHALMIC
  Filled 2024-06-06: qty 2.5

## 2024-06-06 MED ORDER — VANCOMYCIN VARIABLE DOSE PER UNSTABLE RENAL FUNCTION (PHARMACIST DOSING): Status: DC

## 2024-06-06 MED ORDER — VANCOMYCIN HCL IN DEXTROSE 1-5 GM/200ML-% IV SOLN
1000.0000 mg | INTRAVENOUS | Status: DC
  Administered 2024-06-06: 1000 mg via INTRAVENOUS
  Filled 2024-06-06: qty 200

## 2024-06-06 MED ORDER — BRIMONIDINE TARTRATE-TIMOLOL 0.2-0.5 % OP SOLN: 1.0000 [drp] | Freq: Two times a day (BID) | OPHTHALMIC | Status: DC

## 2024-06-06 MED ORDER — CHLORHEXIDINE GLUCONATE CLOTH 2 % EX PADS
6.0000 | MEDICATED_PAD | Freq: Every day | CUTANEOUS | Status: DC
  Administered 2024-06-06 – 2024-06-19 (×14): 6 via TOPICAL

## 2024-06-06 MED ORDER — PIPERACILLIN-TAZOBACTAM IN DEX 2-0.25 GM/50ML IV SOLN
2.2500 g | Freq: Three times a day (TID) | INTRAVENOUS | Status: DC
  Administered 2024-06-06 – 2024-06-16 (×28): 2.25 g via INTRAVENOUS
  Filled 2024-06-06 (×36): qty 50

## 2024-06-06 NOTE — Consult Note (Signed)
 Hospital Consult    Reason for Consult:  Right foot non healing ulcer/wound Requesting Physician:  Dr Marsa Honour MD  MRN #:  [id number]  History of Present Illness: This is a 73 y.o. male  with medical history significant of HTN, CAD, GERD, dementia, ESRD-HD (MWF?), GIST, HBV, who presents with foot wound and foot pain.   Patient has dementia and is unable to to provide accurate medical history, therefore, most of the history is obtained through Dr Honour and the nursing staff and his brother Belynda Ellen via phone.  History is limited.  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   Glaucoma    Hypertension    Knee pain, left    Multiple myeloma (HCC)     Past Surgical History:  Procedure Laterality Date   ABDOMINAL SURGERY  w/in 10 years   to remove cancer    DIALYSIS/PERMA CATHETER INSERTION N/A 07/26/2018   Procedure: DIALYSIS/PERMA CATHETER INSERTION;  Surgeon: Jama Cordella MATSU, MD;  Location: [facility];  Service: Cardiovascular;  Laterality: N/A;   DIALYSIS/PERMA CATHETER INSERTION N/A 05/05/2019   Procedure: DIALYSIS/PERMA CATHETER EXCHANGE;  Surgeon: Jama Cordella MATSU, MD;  Location: [facility];  Service: Cardiovascular;  Laterality: N/A;   NO PAST SURGERIES     PERIPHERAL VASCULAR THROMBECTOMY Left 05/05/2019   Procedure: PERIPHERAL VASCULAR THROMBECTOMY;  Surgeon: Jama Cordella MATSU, MD;  Location: [facility];  Service: Cardiovascular;  Laterality: Left;    Allergies  Allergen Reactions   Lisinopril Cough    Prior to Admission medications   Medication Sig Start Date End Date Taking? Authorizing Provider  acetaminophen  (TYLENOL ) 325 MG tablet Take 650 mg by mouth every 4 (four) hours as needed for mild pain (pain score 1-3).   Yes [provider]   alum & mag hydroxide-simeth (MAALOX/MYLANTA) 200-200-20 MG/5ML suspension Take 30 mLs by mouth every 6 (six) hours as needed for indigestion or heartburn.   Yes [provider]  aspirin  81 MG chewable tablet Chew 81 mg by mouth daily.   Yes [provider]  brimonidine -timolol  (COMBIGAN ) 0.2-0.5 % ophthalmic solution Place 1 drop into both eyes 2 (two) times daily.   Yes [provider]  carvedilol  (COREG ) 12.5 MG tablet Take 12.5 mg by mouth at bedtime.   Yes [provider]  diclofenac sodium (VOLTAREN) 1 % GEL Apply 4 g topically 4 (four) times daily.   Yes [provider]  donepezil  (ARICEPT ) 10 MG tablet Take 10 mg by mouth at bedtime.   Yes [provider]  entecavir  (BARACLUDE ) 0.5 MG tablet Take 0.5 mg by mouth every 3 (three) days.   Yes [provider]  guaiFENesin (ROBITUSSIN) 100 MG/5ML liquid Take 15 mLs by mouth every 4 (four) hours as needed for cough or to loosen phlegm.   Yes [provider]  ipratropium-albuterol  (DUONEB) 0.5-2.5 (3) MG/3ML SOLN Inhale 3 mLs into the lungs 3 (three) times daily.   Yes [provider]  K Phos  Mono-Sod Phos Di & Mono (PHOSPHA 250 NEUTRAL) 155-852-130 MG TABS Take 1 tablet by mouth daily. 06/19/22  Yes [provider]  loperamide (IMODIUM A-D) 2 MG tablet Take 4 mg by mouth 3 (three) times daily as needed for diarrhea or loose stools.   Yes [provider]  LUMIGAN 0.01 % SOLN Place 1 drop into both eyes at bedtime. 11/24/22  Yes [provider]  magnesium  hydroxide (MILK OF MAGNESIA) 400 MG/5ML suspension Take 30 mLs by mouth daily as needed for mild constipation.   Yes [provider]  magnesium  oxide (MAG-OX) 400 (240 Mg) MG tablet Take 400 mg by mouth daily.   Yes [provider]  Melatonin 10 MG TABS Take 1 tablet by mouth at bedtime.   Yes [provider]  memantine  (NAMENDA ) 5 MG tablet Take 5 mg by mouth 2 (two)  times daily. 12/02/22  Yes [provider]  midodrine (PROAMATINE) 10 MG tablet Take 10 mg by mouth Every Tuesday,Thursday,and Saturday with dialysis. 06/16/23  Yes [provider]  MUCINEX 600 MG 12 hr tablet Take 600 mg by mouth 2 (two) times daily. 12/20/23  Yes [provider]  Oyster Shell 500 MG TABS Take 1,000 mg by mouth daily.   Yes [provider]  pantoprazole  (PROTONIX ) 40 MG tablet Take 40 mg by mouth daily.   Yes [provider]  RHOPRESSA 0.02 % SOLN Place 1 drop into both eyes at bedtime. 10/27/22  Yes [provider]  simvastatin  (ZOCOR ) 10 MG tablet Take 10 mg by mouth at bedtime.   Yes [provider]  torsemide  (DEMADEX ) 20 MG tablet Take 80 mg by mouth daily.   Yes [provider]  traZODone (DESYREL) 50 MG tablet Take 75 mg by mouth at bedtime. 05/16/24  Yes [provider]  Skin Protectants, Misc. (MINERIN) CREA Apply 1 application topically 2 (two) times daily.    [provider]    Social History   Socioeconomic History   Marital status: Married    Spouse name: Ronal Caldron    Number of children: 2   Years of education: Not on file   Highest education level: Not on file  Occupational History   Occupation: retired    Comment: games developer  Tobacco Use   Smoking status: Never   Smokeless tobacco: Never  Substance and Sexual Activity   Alcohol use: Not Currently   Drug use: Never   Sexual activity: Not Currently  Other Topics Concern   Not on file  Social History Narrative   Not on file   Social Drivers of Health   Financial Resource Strain: Not on file  Food Insecurity: No Food Insecurity (07/06/2023)   Hunger Vital Sign    Worried About Running Out of Food in the Last Year: Never true    Ran Out of Food in the Last Year: Never true  Transportation Needs: No Transportation Needs (07/06/2023)   PRAPARE - Administrator, Civil Service (Medical): No    Lack  of Transportation (Non-Medical): No  Physical Activity: Not on file  Stress: Not on file  Social Connections: Not on file  Intimate Partner Violence: Not At Risk (07/06/2023)   Humiliation, Afraid, Rape, and Kick questionnaire    Fear of Current or Ex-Partner: No    Emotionally Abused: No    Physically Abused: No    Sexually Abused: No     Family History  Problem Relation Age of Onset   Varicose Veins Neg Hx     ROS: Otherwise negative unless mentioned in HPI  Physical Examination  Vitals:   06/06/24 0456 06/06/24 0836  BP: (!) 144/68 116/66  Pulse: 70 (!) 55  Resp: 16 16  Temp: 98.4 F (36.9 C) (!) 97.4 F (  36.3 C)  SpO2: 100% 100%   Body mass index is 38 kg/m.  General:  WDWN in NAD Gait: Not observed HENT: WNL, normocephalic Pulmonary: normal non-labored breathing, without Rales, rhonchi,  wheezing Cardiac: regular, without  Murmurs, rubs or gallops; without carotid bruits Abdomen: Positive bowel sounds throughout, soft, NT/ND, no masses Skin: without rashes Vascular Exam/Pulses: All extremities are warm to touch with palpable pulses. Unable to palpate right DP/PT pulses. Faint PT with doppler.  Extremities: with ischemic changes, without Gangrene , with cellulitis; with open wounds;  Musculoskeletal: no muscle wasting or atrophy  Neurologic: A&O X 1;  Hx of Advanced Dementia. No focal weakness or paresthesias are detected; speech is fluent/normal Psychiatric:  The pt has Normal affect. Lymph:  Unremarkable  CBC    Component Value Date/Time   WBC 9.4 06/06/2024 0319   RBC 3.49 (L) 06/06/2024 0319   HGB 9.8 (L) 06/06/2024 0319   HGB 13.9 04/01/2012 0508   HCT 30.1 (L) 06/06/2024 0319   HCT 41.2 04/01/2012 0508   PLT 314 06/06/2024 0319   PLT 235 04/01/2012 0508   MCV 86.2 06/06/2024 0319   MCV 84 04/01/2012 0508   MCH 28.1 06/06/2024 0319   MCHC 32.6 06/06/2024 0319   RDW 15.1 06/06/2024 0319   RDW 13.9 04/01/2012 0508   LYMPHSABS 1.6 06/05/2024  2132   LYMPHSABS 1.9 04/01/2012 0508   MONOABS 0.8 06/05/2024 2132   MONOABS 0.7 04/01/2012 0508   EOSABS 0.2 06/05/2024 2132   EOSABS 0.1 04/01/2012 0508   BASOSABS 0.0 06/05/2024 2132   BASOSABS 0.0 04/01/2012 0508    BMET    Component Value Date/Time   NA 140 06/06/2024 0319   NA 141 04/02/2012 0955   K 4.5 06/06/2024 0319   K 3.8 04/02/2012 0955   CL 101 06/06/2024 0319   CL 105 04/02/2012 0955   CO2 25 06/06/2024 0319   CO2 24 04/02/2012 0955   GLUCOSE 100 (H) 06/06/2024 0319   GLUCOSE 161 (H) 04/02/2012 0955   BUN 33 (H) 06/06/2024 0319   BUN 14 04/02/2012 0955   CREATININE 7.81 (H) 06/06/2024 0319   CREATININE 1.65 (H) 04/02/2012 0955   CALCIUM  8.7 (L) 06/06/2024 0319   CALCIUM  9.3 04/02/2012 0955   GFRNONAA 7 (L) 06/06/2024 0319   GFRNONAA 44 (L) 04/02/2012 0955   GFRAA 9 (L) 05/04/2019 2344   GFRAA 50 (L) 04/02/2012 0955    COAGS: Lab Results  Component Value Date   INR 1.1 06/05/2024   INR 1.3 (H) 07/05/2023     Non-Invasive Vascular Imaging:   EXAM:06/06/2024 MRI OF THE RIGHT ANKLE WITHOUT CONTRAST   TECHNIQUE: Multiplanar, multisequence MR imaging of the ankle was performed. No intravenous contrast was administered.   COMPARISON:  Radiographs 06/05/2024.   FINDINGS: Forefoot findings dictated separately.   TENDONS   Peroneal: Intact and normally positioned.   Posteromedial: Intact and normally positioned.   Anterior: The tibialis anterior tendon is moderately thickened within the distal lower leg. At the level of the soft tissue ulceration, the tendon is attenuated, suspicious for a high-grade partial tear. The extensor digitorum longus tendons appear unremarkable. The extensor hallucis longus tendon is not well visualized at the level of the ulceration.   Achilles: Intact.   Plantar Fascia: Intact.  Small plantar calcaneal spur.   LIGAMENTS   Lateral: The anterior and posterior talofibular and calcaneofibular ligaments are  intact.The inferior tibiofibular ligaments appear intact.   Medial: The deltoid and visualized portions of the spring  ligament appear intact.   CARTILAGE AND BONES   Ankle Joint: Small nonspecific ankle joint effusion. The talar dome and tibial plafond appear normal, without erosive changes.   Subtalar Joints/Sinus Tarsi: Unremarkable.   Bones: As seen on the forefoot examination, there is abnormal marrow signal within the navicular, medial and middle cuneiform bones with decreased T1 and increased T2 marrow signal. These marrow changes are in close proximity to soft tissue ulceration involving the dorsal and medial aspect of the midfoot, findings highly suspicious for osteomyelitis. No significant osseous abnormalities identified in the hindfoot.   Other: As above, soft tissue ulceration along the dorsal and medial aspect of the midfoot with associated partial tearing of the tibialis anterior and possible the extensor hallucis longus tendons. Moderate generalized subcutaneous edema surrounding the ankle and extending into the dorsal aspect of the foot. No organized fluid collection or foreign body identified.   IMPRESSION: 1. Soft tissue ulceration along the dorsal and medial aspect of the midfoot with associated partial tearing of the tibialis anterior and possible extensor hallucis longus tendons. 2. Abnormal marrow signal within the navicular, medial and middle cuneiform bones, highly suspicious for osteomyelitis. 3. No evidence of septic arthritis or osteomyelitis in the hindfoot. 4. Nonspecific small ankle joint effusion. 5. Forefoot findings dictated separately.  EXAM:06/06/2024 MRI OF THE RIGHT FOREFOOT WITHOUT CONTRAST   TECHNIQUE: Multiplanar, multisequence MR imaging of the right forefoot was performed. No intravenous contrast was administered.   COMPARISON:  Radiographs 06/05/2024   FINDINGS: Ankle and hindfoot findings are dictated separately.    Bones/Joint/Cartilage   Soft tissue ulceration along the dorsal medial aspect of the midfoot. Soft tissue findings are further discussed below. There is abnormal marrow signal within the navicular, middle and medial cuneiform bones with decreased T1 and increased T2 signal. Given the proximity to the overlying skin wounds, this is suspicious for osteomyelitis. There is mild T2 hyperintensity medially in the base of the 1st metatarsal. The other metatarsals and phalanges appear unremarkable. No significant joint effusions. The alignment is normal at the Lisfranc joint.   Ligaments   Intact Lisfranc ligament. The collateral ligaments of the metatarsophalangeal joints are intact.   Muscles and Tendons   Abnormality of the tibialis anterior tendon is described on ankle/hindfoot examination. The forefoot tendons appear intact, without significant tenosynovitis. Nonspecific mild T2 hyperintensity throughout the forefoot musculature.   Soft tissues   As above, soft tissue ulceration along the dorsal medial aspect of the midfoot. There is moderate edema throughout the dorsal aspect of the forefoot. No organized fluid collection or foreign body identified.   IMPRESSION: 1. Soft tissue ulceration along the dorsal medial aspect of the midfoot with underlying marrow signal abnormality in the navicular, middle and medial cuneiform bones suspicious for osteomyelitis. 2. Mild T2 hyperintensity medially in the base of the 1st metatarsal, nonspecific. 3. No evidence of soft tissue abscess. 4. Ankle and hindfoot findings are dictated separately.  EXAM:06/06/2024 NONINVASIVE PHYSIOLOGIC VASCULAR STUDY OF BILATERAL LOWER EXTREMITIES   TECHNIQUE: Evaluation of both lower extremities were performed at rest, including calculation of ankle-brachial indices with single level pressure measurements and doppler recording.   COMPARISON:  None available.   FINDINGS: Right ABI:  0.86   Left  ABI:  0.88   Right Lower Extremity: Posterior tibial and dorsalis pedis artery waveforms are monophasic.   Left Lower Extremity: Posterior tibial and dorsalis pedis artery waveforms are monophasic.   0.8-0.89 Mild PAD   IMPRESSION: Decreased ankle-brachial indices indicative of mild BILATERAL lower  extremity peripheral arterial disease.   Statin:  Yes.   Beta Blocker:  Yes.   Aspirin :  Yes.   ACEI:  No. ARB:  No. CCB use:  No Other antiplatelets/anticoagulants:  No.    ASSESSMENT/PLAN: This is a 73 y.o. male who resides at the [facility] [address]  He was brought to [facility] emergency department due to extensive nonhealing right foot wound/ulcer.  According to his records from [facility] patient has a significant history of hypertension, CAD, GERD, dementia, end-stage renal disease going to hemodialysis on Monday Wednesday Friday, GIST, HBV.  Due to his advanced dementia he is unable to provide an accurate medical history therefore most of the history obtained was through his chart and speaking with his brother Belynda Ellen via the phone this afternoon.  Vascular surgery was consulted via Dr. Marsa Honour from podiatry regarding diffuse changes to ABIs that were taken.  Patient was known to have an ABI of 0.86 bilaterally with monophasic pulses to his lower extremities.  This is indicative of atherosclerosis to his lower extremities.  Therefore the combination of a nonhealing foot/wound ulcer to his right foot and poor ABIs vascular surgery recommends patient undergo right lower extremity angiogram with possible intervention.  I spoke with the patient's brother Belynda Ellen via telephone this afternoon.  I discussed his brothers overall condition with him.  We discussed his brothers need for a right lower extremity angiogram to evaluate blood flow for healing purposes of a possible upcoming surgery with podiatry.  I discussed in detail the angiogram procedure, benefits, risk, and  complications.  He verbalizes understanding and wishes to proceed on his brother's behalf.  I answered all his questions this afternoon.  I informed him his brother to be made n.p.o. after midnight tonight for his procedure tomorrow.  I informed him that either myself or Dr. Marea would call him after the procedure and let him know the findings.  Via the telephone this afternoon the patient gives his consent for us  to proceed with the angiogram for his brother patient Anthony Houston.  Patient's most recent hemoglobin and hematocrit are 9.8/30.1.  Patient's most recent BUN and creatinine are 33/7.81.  Again he is a hemodialysis patient Monday Wednesday Friday.   -I discussed the case in detail with Dr. Selinda Marea MD and he agrees with the plan.   Gwendlyn JONELLE [facility] 06/06/2024 12:14 PM

## 2024-06-06 NOTE — Progress Notes (Signed)
 IV Vancomycin  bag restarted per night shift RN request. Night shift RN stated pharmacy advised to restart bag once IV access was established again.

## 2024-06-06 NOTE — Plan of Care (Signed)
  Problem: Health Behavior/Discharge Planning: Goal: Ability to manage health-related needs will improve Outcome: Progressing   Problem: Clinical Measurements: Goal: Ability to maintain clinical measurements within normal limits will improve Outcome: Progressing Goal: Will remain free from infection Outcome: Progressing Goal: Diagnostic test results will improve Outcome: Progressing Goal: Respiratory complications will improve Outcome: Progressing Goal: Cardiovascular complication will be avoided Outcome: Progressing   Problem: Activity: Goal: Risk for activity intolerance will decrease Outcome: Progressing   Problem: Nutrition: Goal: Adequate nutrition will be maintained Outcome: Progressing   Problem: Coping: Goal: Level of anxiety will decrease Outcome: Progressing   Problem: Elimination: Goal: Will not experience complications related to bowel motility Outcome: Progressing Goal: Will not experience complications related to urinary retention Outcome: Progressing   Problem: Pain Managment: Goal: General experience of comfort will improve and/or be controlled Outcome: Progressing   Problem: Safety: Goal: Ability to remain free from injury will improve Outcome: Progressing   Problem: Skin Integrity: Goal: Risk for impaired skin integrity will decrease Outcome: Progressing   Problem: Clinical Measurements: Goal: Ability to avoid or minimize complications of infection will improve Outcome: Progressing   Problem: Skin Integrity: Goal: Skin integrity will improve Outcome: Progressing   Problem: Education: Goal: Knowledge of General Education information will improve Description: Including pain rating scale, medication(s)/side effects and non-pharmacologic comfort measures Outcome: Not Progressing

## 2024-06-06 NOTE — Progress Notes (Addendum)
 Pt receives outpt HD at [facility].  TTS @10 :45. Navigator following to assist with any HD needs.  Suzen Satchel Dialysis Navigator [phone]

## 2024-06-06 NOTE — Progress Notes (Signed)
 US  guided IV on right arm found dislodged at patient's side in the bed. IV site dressed and IV discarded. of 2nd bag of Vancomycin  left to be infused. No IV team available for replacement. Pharmacy made aware. Will address with day shift resources.

## 2024-06-06 NOTE — Progress Notes (Signed)
 Hemodialysis Note:  Received patient in bed to unit. Alert and oriented. Informed consent singed and in chart.  Treatment initiated: 1304 Treatment completed: 1648  Access used: left AVG Access issues: None  Patient tolerated well. Transported back to room, alert without acute distress. Report given to patient's RN.  Total UF removed: 1 liter Medications given: Vancomycin  1000 mg IV  Post HD weight: 92.3 Kg  [facility]

## 2024-06-06 NOTE — Consult Note (Signed)
 PODIATRY CONSULTATION  NAME Anthony Houston MRN [id number] DOB 1950-11-08 DOA 06/05/2024   Reason for consult:  Chief Complaint  Patient presents with   Wound Infection    Attending/Consulting physician: MYRTIS Potter MD  History of present illness: Anthony Houston is a 73 y.o. male with medical history significant of HTN, CAD, GERD, dementia, ESRD-HD (MWF?), GIST, HBV, who presents with foot wound and foot pain.   Patient has dementia and is unable to to provide accurate medical history, therefore, most of the history is obtained by discussing the case with ED physician, per EMS report, and with the nursing staff.  History is limited.  Patient unable to provide history given dementia.  I asked him if he is able to walk he says he is.  Patient [address] nursing facility [address]  Unclear how often or what has been done for the wound or when it started.  Apparently there has been some new discharge present from the ulceration.  Unclear the last time the wound dressing had been changed.  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   Glaucoma    Hypertension    Knee pain, left    Multiple myeloma (HCC)        Latest Ref Rng & Units 06/06/2024    3:19 AM 06/05/2024    9:32 PM 07/12/2023    6:34 PM  CBC  WBC 4.0 - 10.5 K/uL 9.4  8.7  7.9   Hemoglobin 13.0 - 17.0 g/dL 9.8  9.8  88.6   Hematocrit 39.0 - 52.0 % 30.1  31.2  35.5   Platelets 150 - 400 K/uL 314  329  222        Latest Ref Rng & Units 06/06/2024    3:19 AM 06/05/2024    4:58 PM 07/12/2023    6:34 PM  BMP  Glucose 70 - 99 mg/dL 899  79  93   BUN 8 - 23 mg/dL 33  29  45   Creatinine 0.61 - 1.24 mg/dL 2.18  3.03  1.08   Sodium 135 - 145 mmol/L 140  140  140   Potassium 3.5 - 5.1 mmol/L 4.5  4.4  5.4   Chloride 98 - 111 mmol/L 101  99  101    CO2 22 - 32 mmol/L 25  27  26    Calcium  8.9 - 10.3 mg/dL 8.7  9.3  8.5       Physical Exam: Lower Extremity Exam  Ulceration proximal aspect of the right midfoot dorsal medial aspect with necrotic tissue present in the central aspect as well as significant fibrotic and necrotic tissues present.  Also with some more superficial ulceration present at the lateral right foot that are more superficial and dry.  Left foot with stable ulceration present at the dorsal midfoot that appears to be mostly healed with no evidence of infection  No active drainage noted.  Unable to assess depth of the wound.  Nonpalpable DP and PT pulses on right foot.  Sensation is diminished to the right foot  Edema noted to the right foot and ankle     ASSESSMENT/PLAN OF CARE 73 y.o. male with PMHx significant for  HTN, CAD, GERD, dementia, ESRD-HD (MWF?), GIST, HBV  with chronic ulceration right  dorsal medial proximal midfoot, possible underlying osteomyelitis also suspect some component of PAD  WBC 9.4 ESR 126, CRP 18.6 XR right foot: Diffuse osteopenia limiting sensitivity for osseous destruction MRI R foot / Ankle: ordered   -Chronic right proximal midfoot ulceration concern for PAD as well as osteomyelitis given elevated inflammatory markers.  Will evaluate with MRI of the foot and ankle.  If there is concern for osteomyelitis of the proximal midfoot including the navicular or talar head/neck patient will likely need below the knee amputation.  - I have asked vascular surgery to evaluate given suspect component of PAD.  ABIs have been completed with some abnormality noted on the right side.  -Should the MRI be negative for osteomyelitis and vascular does not need to intervene possibly OR tomorrow for right ankle wound debridement.  N.p.o. past midnight   - Continue IV abx broad spectrum pending further culture data - Anticoagulation: Hold pending possible OR - Wound care: Appreciate wound ostomy  care recommendations Santyl  dressings for now. - WB status: Weightbearing as tolerated though unclear ambulatory status at baseline. - Will continue to follow   Thank you for the consult.  Please contact me directly with any questions or concerns.           Marolyn JULIANNA Honour, DPM [facility] / [facility]    [address] [place], [place] [address]                Office [phone]  Fax [phone]

## 2024-06-06 NOTE — Progress Notes (Signed)
 [facility]  ROUNDING NOTE   Subjective:   Anthony Houston is a 73 year old male with past medical conditions including hypertension, GERD, dementia, CAD, and end-stage renal disease on hemodialysis.  Patient presents to the emergency department for evaluation of foot wound and pain and has been admitted for Foot ulcer (HCC) [L97.509] Right foot infection [L08.9]  Patient is known to our practice and receives outpatient dialysis treatments at [facility] on a TTS schedule, supervised by Dr. Dennise.  Last full treatment received on Thursday.  Patient is seen resting in bed.  Poor historian with delayed responses.  Appears comfortable, room air.  Lower extremity wounds dressed with gauze.  Labs on ED arrival unremarkable for renal patient.  Right foot and ankle MRI suspicious for osteomyelitis.  We have been consulted to manage dialysis needs.   Objective:  Vital signs in last 24 hours:  Temp:  [97.4 F (36.3 C)-98.4 F (36.9 C)] 97.7 F (36.5 C) (11/25 1234) Pulse Rate:  [55-79] 55 (11/25 1234) Resp:  [15-18] 15 (11/25 1234) BP: (116-166)/(66-118) 149/118 (11/25 1234) SpO2:  [96 %-100 %] 99 % (11/25 1234) Weight:  [93.3 kg-130.6 kg] 93.3 kg (11/25 1234)  Weight change:  Filed Weights   06/05/24 2126 06/06/24 1234  Weight: 130.6 kg 93.3 kg    Intake/Output: I/O last 3 completed shifts: In: 300.1 [IV Piggyback:300.1] Out: -    Intake/Output this shift:  Total I/O In: 167.7 [P.O.:120; IV Piggyback:47.7] Out: -   Physical Exam: General: NAD  Head: Normocephalic, atraumatic. Moist oral mucosal membranes  Eyes: Anicteric  Lungs:  Clear to auscultation, normal effort  Heart: Regular rate and rhythm  Abdomen:  Soft, nontender  Extremities: Trace peripheral edema.  Neurologic: Awake, alert  Skin: Warm,dry, bilateral lower extremity wound dressings  Access: Lt AVF    Basic Metabolic Panel: Recent Labs  Lab 06/05/24 1658 06/06/24 0319  NA  140 140  K 4.4 4.5  CL 99 101  CO2 27 25  GLUCOSE 79 100*  BUN 29* 33*  CREATININE 6.96* 7.81*  CALCIUM  9.3 8.7*    Liver Function Tests: Recent Labs  Lab 06/05/24 1658  AST 24  ALT 11  ALKPHOS 89  BILITOT 0.4  PROT 8.3*  ALBUMIN 3.6   No results for input(s): LIPASE, AMYLASE in the last 168 hours. No results for input(s): AMMONIA in the last 168 hours.  CBC: Recent Labs  Lab 06/05/24 2132 06/06/24 0319  WBC 8.7 9.4  NEUTROABS 6.0  --   HGB 9.8* 9.8*  HCT 31.2* 30.1*  MCV 87.9 86.2  PLT 329 314    Cardiac Enzymes: No results for input(s): CKTOTAL, CKMB, CKMBINDEX, TROPONINI in the last 168 hours.  BNP: Invalid input(s): POCBNP  CBG: Recent Labs  Lab 06/06/24 0834  GLUCAP 84    Microbiology: Results for orders placed or performed during the hospital encounter of 06/05/24  Culture, blood (single) w Reflex to ID Panel     Status: None (Preliminary result)   Collection Time: 06/05/24  4:58 PM   Specimen: BLOOD  Result Value Ref Range Status   Specimen Description BLOOD LEFT ANTECUBITAL  Final   Special Requests   Final    BOTTLES DRAWN AEROBIC AND ANAEROBIC Blood Culture adequate volume   Culture   Final    NO GROWTH < 12 HOURS Performed at [facility], [address], [place], [place] [address]    Report Status PENDING  Incomplete  Culture, blood (Routine X 2)  w Reflex to ID Panel     Status: None (Preliminary result)   Collection Time: 06/05/24  9:39 PM   Specimen: BLOOD  Result Value Ref Range Status   Specimen Description BLOOD BLOOD RIGHT ARM  Final   Special Requests   Final    BOTTLES DRAWN AEROBIC AND ANAEROBIC Blood Culture adequate volume   Culture   Final    NO GROWTH < 12 HOURS Performed at [facility], [address] [place], [place] [address]    Report Status PENDING  Incomplete  Aerobic Culture w Gram Stain (superficial specimen)     Status: None (Preliminary result)   Collection Time:  06/05/24  9:51 PM   Specimen: Wound  Result Value Ref Range Status   Specimen Description   Final    WOUND Performed at [facility], [address] [place], [place] [address]    Special Requests   Final    NONE Performed at [facility], [address] [place], [place] [address]    Gram Stain   Final    RARE WBC PRESENT, PREDOMINANTLY PMN RARE GRAM NEGATIVE RODS RARE GRAM POSITIVE COCCI Performed at [facility], [address] [place], [place] [address]    Culture PENDING  Incomplete   Report Status PENDING  Incomplete  Culture, blood (Routine X 2) w Reflex to ID Panel     Status: None (Preliminary result)   Collection Time: 06/05/24 11:25 PM   Specimen: BLOOD  Result Value Ref Range Status   Specimen Description BLOOD RIGHT ANTECUBITAL  Final   Special Requests   Final    BOTTLES DRAWN AEROBIC AND ANAEROBIC Blood Culture adequate volume   Culture   Final    NO GROWTH < 12 HOURS Performed at [facility], [address] [place], [place] [address]    Report Status PENDING  Incomplete    Coagulation Studies: Recent Labs    06/05/24 06-28-2147  LABPROT 15.3*  INR 1.1    Urinalysis: No results for input(s): COLORURINE, LABSPEC, PHURINE, GLUCOSEU, HGBUR, BILIRUBINUR, KETONESUR, PROTEINUR, UROBILINOGEN, NITRITE, LEUKOCYTESUR in the last 72 hours.  Invalid input(s): APPERANCEUR    Imaging: MR ANKLE RIGHT WO CONTRAST Result Date: 06/06/2024 CLINICAL DATA:  Right foot wound. Evaluate for osteomyelitis. EXAM: MRI OF THE RIGHT ANKLE WITHOUT CONTRAST TECHNIQUE: Multiplanar, multisequence MR imaging of the ankle was performed. No intravenous contrast was administered. COMPARISON:  Radiographs 06/05/2024. FINDINGS: Forefoot findings dictated separately. TENDONS Peroneal: Intact and normally positioned. Posteromedial: Intact and normally positioned. Anterior: The tibialis anterior tendon is moderately thickened within the  distal lower leg. At the level of the soft tissue ulceration, the tendon is attenuated, suspicious for a high-grade partial tear. The extensor digitorum longus tendons appear unremarkable. The extensor hallucis longus tendon is not well visualized at the level of the ulceration. Achilles: Intact. Plantar Fascia: Intact.  Small plantar calcaneal spur. LIGAMENTS Lateral: The anterior and posterior talofibular and calcaneofibular ligaments are intact.The inferior tibiofibular ligaments appear intact. Medial: The deltoid and visualized portions of the spring ligament appear intact. CARTILAGE AND BONES Ankle Joint: Small nonspecific ankle joint effusion. The talar dome and tibial plafond appear normal, without erosive changes. Subtalar Joints/Sinus Tarsi: Unremarkable. Bones: As seen on the forefoot examination, there is abnormal marrow signal within the navicular, medial and middle cuneiform bones with decreased T1 and increased T2 marrow signal. These marrow changes are in close proximity to soft tissue ulceration involving the dorsal and medial aspect of the  midfoot, findings highly suspicious for osteomyelitis. No significant osseous abnormalities identified in the hindfoot. Other: As above, soft tissue ulceration along the dorsal and medial aspect of the midfoot with associated partial tearing of the tibialis anterior and possible the extensor hallucis longus tendons. Moderate generalized subcutaneous edema surrounding the ankle and extending into the dorsal aspect of the foot. No organized fluid collection or foreign body identified. IMPRESSION: 1. Soft tissue ulceration along the dorsal and medial aspect of the midfoot with associated partial tearing of the tibialis anterior and possible extensor hallucis longus tendons. 2. Abnormal marrow signal within the navicular, medial and middle cuneiform bones, highly suspicious for osteomyelitis. 3. No evidence of septic arthritis or osteomyelitis in the hindfoot. 4.  Nonspecific small ankle joint effusion. 5. Forefoot findings dictated separately. Electronically Signed   By: Elsie Perone M.D.   On: 06/06/2024 12:48   MR FOOT RIGHT WO CONTRAST Result Date: 06/06/2024 CLINICAL DATA:  Right foot wound.  Evaluate for osteomyelitis. EXAM: MRI OF THE RIGHT FOREFOOT WITHOUT CONTRAST TECHNIQUE: Multiplanar, multisequence MR imaging of the right forefoot was performed. No intravenous contrast was administered. COMPARISON:  Radiographs 06/05/2024 FINDINGS: Ankle and hindfoot findings are dictated separately. Bones/Joint/Cartilage Soft tissue ulceration along the dorsal medial aspect of the midfoot. Soft tissue findings are further discussed below. There is abnormal marrow signal within the navicular, middle and medial cuneiform bones with decreased T1 and increased T2 signal. Given the proximity to the overlying skin wounds, this is suspicious for osteomyelitis. There is mild T2 hyperintensity medially in the base of the 1st metatarsal. The other metatarsals and phalanges appear unremarkable. No significant joint effusions. The alignment is normal at the Lisfranc joint. Ligaments Intact Lisfranc ligament. The collateral ligaments of the metatarsophalangeal joints are intact. Muscles and Tendons Abnormality of the tibialis anterior tendon is described on ankle/hindfoot examination. The forefoot tendons appear intact, without significant tenosynovitis. Nonspecific mild T2 hyperintensity throughout the forefoot musculature. Soft tissues As above, soft tissue ulceration along the dorsal medial aspect of the midfoot. There is moderate edema throughout the dorsal aspect of the forefoot. No organized fluid collection or foreign body identified. IMPRESSION: 1. Soft tissue ulceration along the dorsal medial aspect of the midfoot with underlying marrow signal abnormality in the navicular, middle and medial cuneiform bones suspicious for osteomyelitis. 2. Mild T2 hyperintensity medially in  the base of the 1st metatarsal, nonspecific. 3. No evidence of soft tissue abscess. 4. Ankle and hindfoot findings are dictated separately. Electronically Signed   By: Elsie Perone M.D.   On: 06/06/2024 12:43   US  ARTERIAL ABI (SCREENING LOWER EXTREMITY) Result Date: 06/06/2024 CLINICAL DATA:  Foot ulcer. Hypertension. Prior vascular surgery. Hyperlipidemia. BILATERAL rest pain and claudication. EXAM: NONINVASIVE PHYSIOLOGIC VASCULAR STUDY OF BILATERAL LOWER EXTREMITIES TECHNIQUE: Evaluation of both lower extremities were performed at rest, including calculation of ankle-brachial indices with single level pressure measurements and doppler recording. COMPARISON:  None available. FINDINGS: Right ABI:  0.86 Left ABI:  0.88 Right Lower Extremity: Posterior tibial and dorsalis pedis artery waveforms are monophasic. Left Lower Extremity: Posterior tibial and dorsalis pedis artery waveforms are monophasic. 0.8-0.89 Mild PAD IMPRESSION: Decreased ankle-brachial indices indicative of mild BILATERAL lower extremity peripheral arterial disease. Electronically Signed   By: Aliene Lloyd M.D.   On: 06/06/2024 10:06   DG Foot Complete Left Result Date: 06/05/2024 EXAM: 3 OR MORE VIEW(S) XRAY OF THE LEFT FOOT 06/05/2024 05:45:00 PM COMPARISON: None available. CLINICAL HISTORY: wound FINDINGS: BONES AND JOINTS: Diffuse bony demineralization. Indistinct deformity  of the proximal phalanx fourth toe could be from acute or healed fracture. Plantar calcaneal spur. No joint dislocation. SOFT TISSUES: Dorsal midfoot skin defect slash bandaging. Soft tissue edema especially along the dorsal forefoot. IMPRESSION: 1. Indistinct deformity of the proximal phalanx of the fourth toe, possibly from acute or healed fracture. 2. Soft tissue edema along the dorsal forefoot with a dorsal midfoot skin defect and overlying bandaging. 3. Diffuse bony demineralization. 4. Plantar calcaneal spur. Electronically signed by: Ryan Salvage MD  06/05/2024 06:50 PM EST RP Workstation: [id number]   DG Foot Complete Right Result Date: 06/05/2024 EXAM: 3 or more VIEW(S) XRAY OF THE RIGHT FOOT 06/05/2024 05:45:00 PM COMPARISON: None available. CLINICAL HISTORY: wound FINDINGS: BONES AND JOINTS: Diffuse osteopenia. The extent of osteopenia makes evaluation for focal osseous destruction challenging. Given this limitation, no focal osseous abnormality identified. No acute fracture. No joint dislocation. SOFT TISSUES: Mild to moderate diffuse soft tissue swelling. More focal soft tissue irregularity about the lateral midfoot and dorsum of the midfoot. IMPRESSION: 1. Soft tissue swelling with lateral and dorsal soft tissue irregularity, suspicious for ulcers. 2. Diffuse osteopenia, limiting sensitivity for osseous destruction by plain film. Consider pre and postcontrast MRI. Electronically signed by: Rockey Kilts MD 06/05/2024 06:46 PM EST RP Workstation: [id number]   DG Chest 2 View Result Date: 06/05/2024 EXAM: 2 VIEW(S) XRAY OF THE CHEST 06/05/2024 05:45:00 PM COMPARISON: 07/12/2023 CLINICAL HISTORY: Wound infection FINDINGS: LINES, TUBES AND DEVICES: Left upper extremity vascular stent noted. LUNGS AND PLEURA: No focal pulmonary opacity. No pleural effusion. No pneumothorax. HEART AND MEDIASTINUM: Aortic atherosclerosis. No acute abnormality of the cardiac and mediastinal silhouettes. BONES AND SOFT TISSUES: Old bilateral rib fractures. IMPRESSION: 1. No acute cardiopulmonary findings. Electronically signed by: Rockey Kilts MD 06/05/2024 06:42 PM EST RP Workstation: [id number]     Medications:    piperacillin -tazobactam Stopped (06/06/24 1150)    aspirin   81 mg Oral Daily   brimonidine   1 drop Both Eyes BID   And   timolol   1 drop Both Eyes BID   Chlorhexidine  Gluconate Cloth  6 each Topical Q0600   collagenase    Topical Daily   donepezil   10 mg Oral QHS   heparin   5,000 Units Subcutaneous Q8H   latanoprost   1 drop Both Eyes QHS    memantine   5 mg Oral BID   simvastatin   10 mg Oral QHS   vancomycin  variable dose per unstable renal function (pharmacist dosing)   Does not apply See admin instructions   acetaminophen , hydrALAZINE , ondansetron  (ZOFRAN ) IV, oxyCODONE -acetaminophen   Assessment/ Plan:  Anthony Houston is a 73 y.o.  male with past medical conditions including hypertension, GERD, dementia, CAD, and end-stage renal disease on hemodialysis.  Patient presents to the emergency department for evaluation of foot wound and pain and has been admitted for Foot ulcer (HCC) [L97.509] Right foot infection [L08.9]   End stage renal disease on hemodialysis. Last treatment received on Thursday. Will schedule dialysis today. Will also receive short treatment tomorrow due to holiday schedule  Lab Results  Component Value Date   CREATININE 7.81 (H) 06/06/2024   CREATININE 6.96 (H) 06/05/2024   CREATININE 8.91 (H) 07/12/2023    Intake/Output Summary (Last 24 hours) at 06/06/2024 1307 Last data filed at 06/06/2024 1237 Gross per 24 hour  Intake 467.72 ml  Output --  Net 467.72 ml   2. Anemia of chronic kidney disease Lab Results  Component Value Date   HGB 9.8 (L) 06/06/2024   Hgb acceptable for  this patient. Will continue to monitor  for need of ESA  3. Secondary Hyperparathyroidism: with outpatient labs:none available  Lab Results  Component Value Date   PTH 101 (H) 07/26/2018   CALCIUM  8.7 (L) 06/06/2024   PHOS 2.6 07/30/2018    Calcium  and phos stable  4. Hypotension with chronic kidney disease. Prescribed Midodrine outpatient   LOS: 0 Farah Lepak 11/25/20251:07 PM

## 2024-06-06 NOTE — Progress Notes (Signed)
 Zosyn  given late due to medication not being available. Medication was requested from pharmacy at 1001; response that med will be tubed soon. Awaiting medication. Will hang once sent up.  Will request future medication times to be adjusted accordingly to the q8h schedule.

## 2024-06-06 NOTE — Progress Notes (Signed)
 Pharmacy Antibiotic Note  Anthony Houston is a 73 y.o. male w/ ESRD on HD, admitted on 06/05/2024 with foot wound with infection.  Pharmacy has been consulted for Zosyn  and Vancomycin  dosing.  Plan: Zosyn  2.25 q8hr per indication & renal fxn.  Pt given Vancomycin  2500 mg once. Current HD schedule unknown, variable Vanc dosing by pharmacy order entered at this time.  Follow up culture results to assess for antibiotic optimization. Monitor renal function to assess for any necessary antibiotic dosing changes. Pharmacy will continue to follow and will adjust abx dosing whenever warranted.  Temp (24hrs), Avg:98.2 F (36.8 C), Min:98 F (36.7 C), Max:98.3 F (36.8 C)   Recent Labs  Lab 06/05/24 1658 06/05/24 2132  WBC  --  8.7  CREATININE 6.96*  --   LATICACIDVEN  --  1.1    Estimated Creatinine Clearance: 13 mL/min (A) (by C-G formula based on SCr of 6.96 mg/dL (H)).    Allergies  Allergen Reactions   Lisinopril Cough    Antimicrobials this admission: 11/24 Zosyn  >>  11/25 Vancomycin  >>   Microbiology results: 11/24 BCx: Pending 11/24 WoundCx: Pending  Thank you for allowing pharmacy to be a part of this patient's care.  Rankin CANDIE Dills, PharmD, MBA 06/06/2024 1:05 AM

## 2024-06-06 NOTE — Progress Notes (Signed)
 Progress Note   Patient: Anthony Houston FMW:[id number] DOB: [birth year] DOA: 06/05/2024     0 DOS: the patient was seen and examined on 06/06/2024   Brief hospital course: Anthony Houston is a 73 y.o. male with medical history significant of HTN, CAD, GERD, dementia, ESRD-HD (MWF?), GIST, HBV, who presents with foot wound and foot pain.  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  Vascular surgeon and podiatry on board.   Assessment and Plan:   Acute osteomyelitis of right foot MRI showing findings suggestive of acute osteomyelitis of the navicular, middle and medial cuneiform bones.  Continue empiric antibiotics with vancomycin  and Zosyn  Vascular surgeon planning angiography tomorrow Podiatry also planning surgery today after   ESRD on hemodialysis (MWF?) Nephrology is consulted for dialysis need   Hypertension Continue Coreg  and as needed hydralazine    CAD (coronary artery disease) Continue Zocor  and aspirin    Anemia in ESRD (end-stage renal disease) (HCC): Hemoglobin 9.8 (11.3 on 07/02/2023) Monitor CBC   Diarrhea -Follow-up C. Difficile   Dementia (HCC): No behavior disturbance -Donepezil  and Namenda    Obesity (BMI 30-39.9): Patient has Obesity Class II, with body weight 130.6 Kg and BMI 38  kg/m2.  - Encourage losing weight - Exercise and healthy diet   DVT ppx: SQ Heparin      Code Status: Full code     Family Communication:    No family present at bedside  Disposition Plan: Pending medical stabilization and clearance by vascular surgeon/podiatry   Consults called: Dr. Malvin of podiatry, Dr. Marcelino of renal    Subjective:  Patient seen and examined at bedside this morning Denies nausea vomiting abdominal pain chest pain cough Vascular surgery and podiatry planning surgical intervention  Physical Exam: General: Not in acute distress Heme: No neck lymph node enlargement. Cardiac:  S1/S2, RRR, No murmurs, No gallops or rubs. Respiratory: No rales, wheezing, rhonchi or rubs. GI: Soft, nondistended, nontender, no rebound pain GU: No hematuria Ext: Has chronic venous insufficiency changes in both legs.  Has greenish drainage from right foot wound Musculoskeletal: No joint deformities, No joint redness or warmth, no limitation of ROM in spin. Skin: No rashes.  Neuro: Alert and oriented following commands Psych: Patient is not psychotic, no suicidal or hemocidal ideation.    Vitals:   06/06/24 1330 06/06/24 1400 06/06/24 1430 06/06/24 1500  BP: 124/66 117/67 104/67 133/69  Pulse: (!) 57 (!) 58 (!) 56 61  Resp: 13 14 12 12   Temp:      TempSrc:      SpO2: 100% 100% 99% 99%  Weight:      Height:          Data Reviewed: MRI of the foot showing findings of osteomyelitis    Latest Ref Rng & Units 06/06/2024    3:19 AM 06/05/2024    9:32 PM 07/12/2023    6:34 PM  CBC  WBC 4.0 - 10.5 K/uL 9.4  8.7  7.9   Hemoglobin 13.0 - 17.0 g/dL 9.8  9.8  88.6   Hematocrit 39.0 - 52.0 % 30.1  31.2  35.5   Platelets 150 - 400 K/uL 314  329  222        Latest Ref Rng & Units 06/06/2024    3:19 AM 06/05/2024    4:58 PM 07/12/2023    6:34 PM  BMP  Glucose 70 - 99 mg/dL 899  79  93   BUN 8 - 23 mg/dL  33  29  45   Creatinine 0.61 - 1.24 mg/dL 2.18  3.03  1.08   Sodium 135 - 145 mmol/L 140  140  140   Potassium 3.5 - 5.1 mmol/L 4.5  4.4  5.4   Chloride 98 - 111 mmol/L 101  99  101   CO2 22 - 32 mmol/L 25  27  26    Calcium  8.9 - 10.3 mg/dL 8.7  9.3  8.5     Status is: Inpatient   Time spent: 51 minutes  Author: Drue ONEIDA Potter, MD 06/06/2024 3:22 PM  For on call review [email or web address].

## 2024-06-06 NOTE — Consult Note (Addendum)
 WOC Nurse Consult Note: Reason for Consult: Consult requested for right foot wound.  Performed remotely after review of progress notes and photos in the EMR.  Right inner anterior foot chronic full thickness wound with a significant amount  of slough/eschar.  8X4.8cm, according to the bedside nurses' wound care flow sheet.   X-ray indicates,  1.Soft tissue swelling with lateral and dorsal soft tissue irregularity, suspicious for ulcers. 2. Diffuse osteopenia, limiting sensitivity for osseous destruction by plain.  MRI is pending.    Pt could benefit from surgical debridement of the nonviable tissue. Podiatry consult is pending. Topical treatment orders provided for  bedside nurses to perform to assist with enzymatic debridement of nonviable tissue until further recommendations are available from the podiatry team.  Please refer to their team for further plan of care.   Apply Santyl  to right foot wound Q day, then cover with moist gauze and ABD pad and kerlex.    Please re-consult if further assistance is needed.  Thank-you,  Stephane Fought MSN, RN, CWOCN, CWCN-AP, CNS Contact Mon-Fri 0700-1500: [phone]

## 2024-06-07 ENCOUNTER — Encounter: Payer: Self-pay | Admitting: Vascular Surgery

## 2024-06-07 ENCOUNTER — Encounter
Admission: EM | Disposition: A | Discharge status: Skilled Nursing Facility | Payer: Self-pay | Source: Skilled Nursing Facility | Attending: Internal Medicine

## 2024-06-07 DIAGNOSIS — I2585 Chronic coronary microvascular dysfunction: Secondary | ICD-10-CM

## 2024-06-07 DIAGNOSIS — N186 End stage renal disease: Secondary | ICD-10-CM | POA: Diagnosis not present

## 2024-06-07 DIAGNOSIS — Z992 Dependence on renal dialysis: Secondary | ICD-10-CM | POA: Diagnosis not present

## 2024-06-07 DIAGNOSIS — L089 Local infection of the skin and subcutaneous tissue, unspecified: Secondary | ICD-10-CM | POA: Diagnosis not present

## 2024-06-07 HISTORY — PX: LOWER EXTREMITY ANGIOGRAPHY: CATH118251

## 2024-06-07 LAB — BASIC METABOLIC PANEL WITH GFR
Anion gap: 12 (ref 5–15)
BUN: 22 mg/dL (ref 8–23)
CO2: 26 mmol/L (ref 22–32)
Calcium: 8.5 mg/dL — ABNORMAL LOW (ref 8.9–10.3)
Chloride: 97 mmol/L — ABNORMAL LOW (ref 98–111)
Creatinine, Ser: 5.5 mg/dL — ABNORMAL HIGH (ref 0.61–1.24)
GFR, Estimated: 10 mL/min — ABNORMAL LOW (ref >60)
Glucose, Bld: 88 mg/dL (ref 70–99)
Potassium: 4.3 mmol/L (ref 3.5–5.1)
Sodium: 135 mmol/L (ref 135–145)

## 2024-06-07 LAB — CBC WITH DIFFERENTIAL/PLATELET
Abs Immature Granulocytes: 0.02 K/uL (ref 0.00–0.07)
Basophils Absolute: 0 K/uL (ref 0.0–0.1)
Basophils Relative: 1 %
Eosinophils Absolute: 0.3 K/uL (ref 0.0–0.5)
Eosinophils Relative: 4 %
HCT: 31.5 % — ABNORMAL LOW (ref 39.0–52.0)
Hemoglobin: 10.1 g/dL — ABNORMAL LOW (ref 13.0–17.0)
Immature Granulocytes: 0 %
Lymphocytes Relative: 24 %
Lymphs Abs: 1.6 K/uL (ref 0.7–4.0)
MCH: 27.5 pg (ref 26.0–34.0)
MCHC: 32.1 g/dL (ref 30.0–36.0)
MCV: 85.8 fL (ref 80.0–100.0)
Monocytes Absolute: 0.7 K/uL (ref 0.1–1.0)
Monocytes Relative: 11 %
Neutro Abs: 4 K/uL (ref 1.7–7.7)
Neutrophils Relative %: 60 %
Platelets: 303 K/uL (ref 150–400)
RBC: 3.67 MIL/uL — ABNORMAL LOW (ref 4.22–5.81)
RDW: 15.1 % (ref 11.5–15.5)
WBC: 6.6 K/uL (ref 4.0–10.5)
nRBC: 0 % (ref 0.0–0.2)

## 2024-06-07 LAB — VANCOMYCIN, RANDOM: Vancomycin Rm: 31 ug/mL

## 2024-06-07 LAB — PHOSPHORUS: Phosphorus: 1.6 mg/dL — ABNORMAL LOW (ref 2.5–4.6)

## 2024-06-07 SURGERY — LOWER EXTREMITY ANGIOGRAPHY
Anesthesia: Moderate Sedation | Laterality: Right

## 2024-06-07 MED ORDER — MIDAZOLAM HCL 2 MG/ML PO SYRP
8.0000 mg | ORAL_SOLUTION | Freq: Once | ORAL | Status: DC | PRN
  Filled 2024-06-07: qty 5

## 2024-06-07 MED ORDER — MIDAZOLAM HCL (PF) 2 MG/2ML IJ SOLN
INTRAMUSCULAR | Status: DC | PRN
  Administered 2024-06-07: 2 mg via INTRAVENOUS

## 2024-06-07 MED ORDER — HEPARIN (PORCINE) IN NACL 1000-0.9 UT/500ML-% IV SOLN
INTRAVENOUS | Status: DC | PRN
  Administered 2024-06-07: 1000 mL

## 2024-06-07 MED ORDER — METHYLPREDNISOLONE SODIUM SUCC 125 MG IJ SOLR: 125.0000 mg | Freq: Once | INTRAMUSCULAR | Status: DC | PRN

## 2024-06-07 MED ORDER — LIDOCAINE-PRILOCAINE 2.5-2.5 % EX CREA: 1.0000 | TOPICAL_CREAM | CUTANEOUS | Status: DC | PRN

## 2024-06-07 MED ORDER — HEPARIN SODIUM (PORCINE) 1000 UNIT/ML DIALYSIS: 1000.0000 [IU] | INTRAMUSCULAR | Status: DC | PRN

## 2024-06-07 MED ORDER — HEPARIN SODIUM (PORCINE) 1000 UNIT/ML DIALYSIS
1000.0000 [IU] | INTRAMUSCULAR | Status: DC | PRN
Start: 2024-06-07 — End: 2024-06-07

## 2024-06-07 MED ORDER — FENTANYL CITRATE (PF) 100 MCG/2ML IJ SOLN
INTRAMUSCULAR | Status: DC | PRN
  Administered 2024-06-07: 50 ug via INTRAVENOUS

## 2024-06-07 MED ORDER — PENTAFLUOROPROP-TETRAFLUOROETH EX AERO: 1.0000 | INHALATION_SPRAY | CUTANEOUS | Status: DC | PRN

## 2024-06-07 MED ORDER — HEPARIN SODIUM (PORCINE) 1000 UNIT/ML IJ SOLN
INTRAMUSCULAR | Status: AC
  Filled 2024-06-07: qty 10

## 2024-06-07 MED ORDER — VANCOMYCIN HCL IN DEXTROSE 1-5 GM/200ML-% IV SOLN
1000.0000 mg | INTRAVENOUS | Status: DC
  Administered 2024-06-07: 1000 mg via INTRAVENOUS
  Filled 2024-06-07: qty 200

## 2024-06-07 MED ORDER — SODIUM CHLORIDE 0.9 % IV SOLN: INTRAVENOUS | Status: DC

## 2024-06-07 MED ORDER — FAMOTIDINE 20 MG PO TABS: 40.0000 mg | ORAL_TABLET | Freq: Once | ORAL | Status: DC | PRN

## 2024-06-07 MED ORDER — MIDAZOLAM HCL 2 MG/2ML IJ SOLN
INTRAMUSCULAR | Status: AC
  Filled 2024-06-07: qty 2

## 2024-06-07 MED ORDER — CLOPIDOGREL BISULFATE 75 MG PO TABS
75.0000 mg | ORAL_TABLET | Freq: Every day | ORAL | Status: DC
  Administered 2024-06-07 – 2024-06-19 (×11): 75 mg via ORAL
  Filled 2024-06-07 (×11): qty 1

## 2024-06-07 MED ORDER — IODIXANOL 320 MG/ML IV SOLN
INTRAVENOUS | Status: DC | PRN
  Administered 2024-06-07: 50 mL via INTRA_ARTERIAL

## 2024-06-07 MED ORDER — HEPARIN SODIUM (PORCINE) 1000 UNIT/ML IJ SOLN
INTRAMUSCULAR | Status: DC | PRN
  Administered 2024-06-07: 5000 [IU] via INTRAVENOUS

## 2024-06-07 MED ORDER — LIDOCAINE-PRILOCAINE 2.5-2.5 % EX CREA
1.0000 | TOPICAL_CREAM | CUTANEOUS | Status: DC | PRN
  Filled 2024-06-07: qty 5

## 2024-06-07 MED ORDER — LIDOCAINE-EPINEPHRINE (PF) 1 %-1:200000 IJ SOLN
INTRAMUSCULAR | Status: DC | PRN
  Administered 2024-06-07: 10 mL via INTRADERMAL

## 2024-06-07 MED ORDER — DIPHENHYDRAMINE HCL 50 MG/ML IJ SOLN: 50.0000 mg | Freq: Once | INTRAMUSCULAR | Status: DC | PRN

## 2024-06-07 MED ORDER — SODIUM CHLORIDE 0.9 % IV SOLN: INTRAVENOUS | Status: AC | PRN

## 2024-06-07 MED ORDER — FENTANYL CITRATE (PF) 50 MCG/ML IJ SOSY
PREFILLED_SYRINGE | INTRAMUSCULAR | Status: AC
  Filled 2024-06-07: qty 1

## 2024-06-07 SURGICAL SUPPLY — 18 items
BALLOON LUTONIX 7X100X130 (BALLOONS) IMPLANT
BALLOON LUTONIX DCB 7X60X130 (BALLOONS) IMPLANT
BALLOON ULTRVRSE 3X220X150 (BALLOONS) IMPLANT
CATH ANGIO 5F PIGTAIL 65CM (CATHETERS) IMPLANT
CATH BEACON 5 .038 100 VERT TP (CATHETERS) IMPLANT
CATH CXI SUPP ANG 4FR 135 (CATHETERS) IMPLANT
COVER PROBE ULTRASOUND 5X96 (MISCELLANEOUS) IMPLANT
DEVICE PRESTO INFLATION (MISCELLANEOUS) IMPLANT
DEVICE STARCLOSE SE CLOSURE (Vascular Products) IMPLANT
GLIDEWIRE ADV .035X260CM (WIRE) IMPLANT
GUIDEWIRE PFTE-COATED .018X300 (WIRE) IMPLANT
PACK ANGIOGRAPHY (CUSTOM PROCEDURE TRAY) ×1 IMPLANT
SHEATH BRITE TIP 5FRX11 (SHEATH) IMPLANT
SHEATH FLEXOR ANSEL2 7FRX45 (SHEATH) IMPLANT
STENT VIABAHN 8X50X120 (Permanent Stent) IMPLANT
SYR MEDRAD MARK 7 150ML (SYRINGE) IMPLANT
TUBING CONTRAST HIGH PRESS 72 (TUBING) IMPLANT
WIRE J 3MM .035X145CM (WIRE) IMPLANT

## 2024-06-07 NOTE — Progress Notes (Signed)
 0 ultrafiltration, condition stable post hemodialysis, and hemodialysis run time equaled 24 minutes.  This patient was sent to special procedures via transportation condition stable.

## 2024-06-07 NOTE — Progress Notes (Signed)
 Light femoral angiogram performed on patient earlier today. During head to toe shift assessment noticed site was bleeding. Pressure dressing applied and patient placed in supine position. Dr. Marea notified, orders given to place patient on bedrest overnight. Order put in. No further updates.

## 2024-06-07 NOTE — Op Note (Signed)
 [facility]  Percutaneous Study/Intervention Procedural Note   Date of Surgery: 06/07/2024  Surgeon(s):Elsi Stelzer    Assistants:none  Pre-operative Diagnosis: PAD with ulceration right lower extremity  Post-operative diagnosis:  Same  Procedure(s) Performed:             1.  Ultrasound guidance for vascular access left femoral artery             2.  Catheter placement into right common femoral artery from left femoral approach             3.  Aortogram and selective right lower extremity angiogram             4.  Percutaneous transluminal angioplasty of right peroneal artery with 2 inflations with a 3 mm diameter by 22 cm length angioplasty balloon             5.  Percutaneous transluminal angioplasty of the right distal SFA/proximal popliteal artery at Hunter's canal with 7 mm diameter by 10 cm length Lutonix drug-coated angioplasty balloon  6.  Stent placement to the right distal SFA/proximal popliteal artery at Hunter's canal with 8 mm diameter by 5 cm length Viabahn stent             7.  StarClose closure device left femoral artery  EBL: 5 cc  Contrast: 50 cc  Fluoro Time: 4.5 minutes  Moderate Conscious Sedation Time: approximately 37 minutes using 2 mg of Versed  and 50 mcg of Fentanyl               Indications:  Patient is a 73 y.o.male with nonhealing ulceration and infection of the right foot.  The patient is brought in for angiography for further evaluation and potential treatment.  Due to the limb threatening nature of the situation, angiogram was performed for attempted limb salvage. The patient is aware that if the procedure fails, amputation would be expected.  The patient also understands that even with successful revascularization, amputation may still be required due to the severity of the situation.  Risks and benefits are discussed and informed consent is obtained.   Procedure:  The patient was identified and appropriate procedural time out was  performed.  The patient was then placed supine on the table and prepped and draped in the usual sterile fashion. Moderate conscious sedation was administered during a face to face encounter with the patient throughout the procedure with my supervision of the RN administering medicines and monitoring the patient's vital signs, pulse oximetry, telemetry and mental status throughout from the start of the procedure until the patient was taken to the recovery room. Ultrasound was used to evaluate the left common femoral artery.  It was patent .  A digital ultrasound image was acquired.  A Seldinger needle was used to access the left common femoral artery under direct ultrasound guidance and a permanent image was performed.  A 0.035 J wire was advanced without resistance and a 5Fr sheath was placed.  Pigtail catheter was placed into the aorta and an AP aortogram was performed. This demonstrated normal renal arteries and normal aorta and iliac segments without significant stenosis. I then crossed the aortic bifurcation and advanced to the right femoral head. Selective right lower extremity angiogram was then performed. This demonstrated fairly large vessels with a poor profunda femoris artery.  The proximal to mid superficial femoral artery were patent, but there was stenosis at Hunter's canal in the distal SFA/proximal popliteal artery in the 65 to 70% range.  The remainder of the popliteal artery normalized.  The anterior tibial artery was large but then occluded in the mid to distal segment without reconstitution in the foot.  The peroneal artery was occluded in the proximal to mid segment with reconstitution in the mid to distal segment but then had large collaterals and was the only filling to the foot.  The posterior tibial artery was chronically occluded without distal reconstitution. It was felt that it was in the patient's best interest to proceed with intervention after these images to avoid a second procedure  and a larger amount of contrast and fluoroscopy based off of the findings from the initial angiogram. The patient was systemically heparinized and a 7 French Ansell sheath was then placed over the [employer] wire. I then used a Kumpe catheter and the advantage wire to navigate through the SFA/popliteal lesion and get down into the tibioperoneal trunk where I exchanged for a 0.018 advantage wire and a CXI catheter.  I was able to cross the long segment occlusion in the peroneal artery without difficulty and parked the wire in the foot.  I then treated the peroneal artery with 2 inflations with a 3 mm diameter by 22 cm length angioplasty balloon inflated from the distal peroneal artery all the way up to the tibioperoneal trunk.  Each inflation was 10 to 12 atm for 1 minute.  The SFA/popliteal artery was addressed with a 7 mm diameter by 10 cm length Lutonix drug-coated angioplasty balloon inflated to 10 atm for 1 minute.  Completion imaging showed greater than 50% residual stenosis and wall irregularity in the right distal SFA/popliteal artery but the peroneal artery was now continuous and filled the foot reasonably well with nothing that appeared to be a greater than 25% residual stenosis in the peroneal artery.  I then stented the distal SFA and above-knee popliteal artery with a 8 mm diameter by 5 cm length Viabahn stent postdilated with a 7 mm balloon with excellent angiographic completion result and less than 10% residual stenosis in this location. I elected to terminate the procedure. The sheath was removed and StarClose closure device was deployed in the left femoral artery with excellent hemostatic result. The patient was taken to the recovery room in stable condition having tolerated the procedure well.  Findings:               Aortogram:  This demonstrated normal renal arteries and normal aorta and iliac segments without significant stenosis.             Right Lower Extremity:  This demonstrated  fairly large vessels with a poor profunda femoris artery.  The proximal to mid superficial femoral artery were patent, but there was stenosis at Hunter's canal in the distal SFA/proximal popliteal artery in the 65 to 70% range.  The remainder of the popliteal artery normalized.  The anterior tibial artery was large but then occluded in the mid to distal segment without reconstitution in the foot.  The peroneal artery was occluded in the proximal to mid segment with reconstitution in the mid to distal segment but then had large collaterals and was the only filling to the foot.  The posterior tibial artery was chronically occluded without distal reconstitution.    Disposition: Patient was taken to the recovery room in stable condition having tolerated the procedure well.  Complications: None  Selinda Gu 06/07/2024 10:39 AM   This note was created with Dragon Medical transcription system. Any errors in dictation are purely unintentional.

## 2024-06-07 NOTE — Progress Notes (Signed)
  Progress Note   Patient: Anthony Houston FMW:[id number] DOB: [birth year] DOA: 06/05/2024     1 DOS: the patient was seen and examined on 06/07/2024   Brief hospital course: Anthony Houston is a 73 y.o. male with medical history significant of HTN, CAD, GERD, dementia, ESRD-HD (MWF?), GIST, HBV, who presents with foot wound and foot pain.  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  Vascular surgeon and podiatry on board.    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  Continue empiric antibiotics with vancomycin  and Zosyn  Discussed with vascular surgery, patient had angioplasty and drug-eluting stent placed. Podiatry is planning for surgical debridement.   ESRD on hemodialysis (MWF?) Continue dialysis per nephrology.   Hypertension Continue Coreg  and as needed hydralazine    CAD (coronary artery disease) Continue Zocor  and aspirin    Anemia in ESRD (end-stage renal disease) (HCC): Hemoglobin 9.8 (11.3 on 07/02/2023) Hb stable   Diarrhea Condition seem to be improved, not able to collect stool   Dementia [facility]): No behavior disturbance -Donepezil  and Namenda    Obesity (BMI 30-39.9): Patient has Obesity Class II, with body weight 130.6 Kg and BMI 38  kg/m2.  Diet and excise.       Subjective:  Patient doing well today, diarrhea has resolved.  No nausea vomiting. No shortness of breath.  Physical Exam: Vitals:   06/07/24 1400 06/07/24 1430 06/07/24  1454 06/07/24 1500  BP: 127/63 123/65  122/68  Pulse: 64 63 68 64  Resp: 15 15 19 19   Temp:    98.3 F (36.8 C)  TempSrc:      SpO2: 95% 97% 97% 96%  Weight:      Height:       General exam: Appears calm and comfortable  Respiratory system: Clear to auscultation. Respiratory effort normal. Cardiovascular system: S1 & S2 heard, RRR. No JVD, murmurs, rubs, gallops or clicks. No pedal edema. Gastrointestinal system: Abdomen is nondistended, soft and nontender. No organomegaly or masses felt. Normal bowel sounds heard. Central nervous system: Alert and oriented x2. No focal neurological deficits. Extremities: Symmetric 5 x 5 power. Skin: No rashes, lesions or ulcers Psychiatry: Judgement and insight appear normal. Mood & affect appropriate.    Data Reviewed:  Reviewed MRI results and lab results.  Family Communication: None  Disposition: Status is: Inpatient Remains inpatient appropriate because: Severity of disease, IV treatment.     Time spent: 35 minutes  Author: Murvin Mana, MD 06/07/2024 3:24 PM  For on call review [email or web address].

## 2024-06-07 NOTE — Plan of Care (Signed)

## 2024-06-07 NOTE — Plan of Care (Signed)
 Podiatry plan of care re dorsal right proximal midfoot ulceration  Patient status post angiogram right lower extremity appreciate vascular surgery assistance.  MRI findings with concern for marrow edema of the navicular middle and medial cuneiform suspicious for osteomyelitis  Discussed the MRI findings with the patient's brother CHRISTELLA Ellen over the phone just now regarding concern for significant osseous infection in the midfoot on the right foot.  Discussed surgical options are limited in regards to osseous resection in the area.  Given extent of infection seen on the MRI recommendation will be for a proximal limb amputation either in the above or below the knee level per vascular surgery recommendations.  Alternatively could proceed with debridement of the wound however this will not adequately address the osseous infection and leave him at high risk for nonhealing and amputation in the future.  Would need prolonged course of antibiotic therapy in that scenario.  Discussed risks and benefits as well as options with the patient's brother in detail.  He says he will call the patient later today to discuss these recommendations with him and will think about what he would prefer to proceed with, he did sound open to the idea of more proximal amputation if recommended due to osseous infection.  Tentatively patient is scheduled for debridement on Friday with Dr. DELENA Ovens who is covering for our group this weekend.  However likely to cancel this case if patient/his brother agrees with proximal limb amputation as recommended.  Dr. Ovens will reach out to discuss further tomorrow with the patients brother Belynda re surgical plan/ decision on proximal limb amp vs salvage attempt.    For now continue IV antibiotic therapy and wound care with Santyl  pending family decision regarding proximal limb amputation.        Marolyn JULIANNA Honour, DPM [facility] / [facility]                   06/07/2024

## 2024-06-07 NOTE — Plan of Care (Signed)
  Problem: Clinical Measurements: Goal: Ability to maintain clinical measurements within normal limits will improve Outcome: Progressing   Problem: Clinical Measurements: Goal: Respiratory complications will improve Outcome: Progressing   Problem: Nutrition: Goal: Adequate nutrition will be maintained Outcome: Progressing   Problem: Pain Managment: Goal: General experience of comfort will improve and/or be controlled Outcome: Progressing   Problem: Skin Integrity: Goal: Skin integrity will improve Outcome: Progressing

## 2024-06-07 NOTE — Progress Notes (Signed)
 [facility]  ROUNDING NOTE   Subjective:   Anthony Houston is a 73 year old male with past medical conditions including hypertension, GERD, dementia, CAD, and end-stage renal disease on hemodialysis.  Patient presents to the emergency department for evaluation of foot wound and pain and has been admitted for Foot ulcer (HCC) [L97.509] Right foot infection [L08.9]  Patient is known to our practice and receives outpatient dialysis treatments at [facility] on a TTS schedule, supervised by Dr. Dennise.    Patient was seen and evaluated during dialysis   HEMODIALYSIS FLOWSHEET:  Blood Flow Rate (mL/min): 399 mL/min Arterial Pressure (mmHg): -223.83 mmHg Venous Pressure (mmHg): 199.18 mmHg TMP (mmHg): 5.86 mmHg Ultrafiltration Rate (mL/min): 600 mL/min Dialysate Flow Rate (mL/min): 299 ml/min  Denies discomfort at this time NPO for scheduled vascular procedure   Objective:  Vital signs in last 24 hours:  Temp:  [97.7 F (36.5 C)-98.7 F (37.1 C)] 98.1 F (36.7 C) (11/26 0933) Pulse Rate:  [55-71] 70 (11/26 0933) Resp:  [12-24] 15 (11/26 0933) BP: (104-149)/(63-118) 120/71 (11/26 0933) SpO2:  [97 %-100 %] 97 % (11/26 0933) Weight:  [92.3 kg-98.2 kg] 98.2 kg (11/26 0800)  Weight change: -37.3 kg Filed Weights   06/06/24 1234 06/06/24 1648 06/07/24 0800  Weight: 93.3 kg 92.3 kg 98.2 kg    Intake/Output: I/O last 3 completed shifts: In: 887.7 [P.O.:240; IV Piggyback:647.7] Out: 1000 [Other:1000]   Intake/Output this shift:  No intake/output data recorded.  Physical Exam: General: NAD  Head: Normocephalic, atraumatic. Moist oral mucosal membranes  Eyes: Anicteric  Lungs:  Clear to auscultation, normal effort  Heart: Regular rate and rhythm  Abdomen:  Soft, nontender  Extremities: Trace peripheral edema.  Neurologic: Awake, alert  Skin: Warm,dry, bilateral lower extremity wound dressings  Access: Lt AVF    Basic Metabolic Panel: Recent  Labs  Lab 06/05/24 1658 06/06/24 0319 06/07/24 0559  NA 140 140 135  K 4.4 4.5 4.3  CL 99 101 97*  CO2 27 25 26   GLUCOSE 79 100* 88  BUN 29* 33* 22  CREATININE 6.96* 7.81* 5.50*  CALCIUM  9.3 8.7* 8.5*    Liver Function Tests: Recent Labs  Lab 06/05/24 1658  AST 24  ALT 11  ALKPHOS 89  BILITOT 0.4  PROT 8.3*  ALBUMIN 3.6   No results for input(s): LIPASE, AMYLASE in the last 168 hours. No results for input(s): AMMONIA in the last 168 hours.  CBC: Recent Labs  Lab 06/05/24 2132 06/06/24 0319  WBC 8.7 9.4  NEUTROABS 6.0  --   HGB 9.8* 9.8*  HCT 31.2* 30.1*  MCV 87.9 86.2  PLT 329 314    Cardiac Enzymes: No results for input(s): CKTOTAL, CKMB, CKMBINDEX, TROPONINI in the last 168 hours.  BNP: Invalid input(s): POCBNP  CBG: Recent Labs  Lab 06/06/24 0834  GLUCAP 84    Microbiology: Results for orders placed or performed during the hospital encounter of 06/05/24  Culture, blood (single) w Reflex to ID Panel     Status: None (Preliminary result)   Collection Time: 06/05/24  4:58 PM   Specimen: BLOOD  Result Value Ref Range Status   Specimen Description BLOOD LEFT ANTECUBITAL  Final   Special Requests   Final    BOTTLES DRAWN AEROBIC AND ANAEROBIC Blood Culture adequate volume   Culture   Final    NO GROWTH 1 DAY Performed at [facility], [address] [place], [place] [address]    Report Status PENDING  Incomplete  Culture, blood (Routine X 2) w Reflex to ID Panel     Status: None (Preliminary result)   Collection Time: 06/05/24  9:39 PM   Specimen: BLOOD  Result Value Ref Range Status   Specimen Description BLOOD BLOOD RIGHT ARM  Final   Special Requests   Final    BOTTLES DRAWN AEROBIC AND ANAEROBIC Blood Culture adequate volume   Culture   Final    NO GROWTH 1 DAY Performed at [facility], [address] [place], [place] [address]    Report Status PENDING  Incomplete  Aerobic Culture w Gram Stain  (superficial specimen)     Status: None (Preliminary result)   Collection Time: 06/05/24  9:51 PM   Specimen: Wound  Result Value Ref Range Status   Specimen Description   Final    WOUND Performed at [facility], [address], [place], [place] [address]    Special Requests   Final    NONE Performed at [facility], [address] [place], [place] [address]    Gram Stain   Final    RARE WBC PRESENT, PREDOMINANTLY PMN RARE GRAM NEGATIVE RODS RARE GRAM POSITIVE COCCI    Culture   Final    CULTURE REINCUBATED FOR BETTER GROWTH Performed at [facility], [address], [place], [place] [address]    Report Status PENDING  Incomplete  Culture, blood (Routine X 2) w Reflex to ID Panel     Status: None (Preliminary result)   Collection Time: 06/05/24 11:25 PM   Specimen: BLOOD  Result Value Ref Range Status   Specimen Description BLOOD RIGHT ANTECUBITAL  Final   Special Requests   Final    BOTTLES DRAWN AEROBIC AND ANAEROBIC Blood Culture adequate volume   Culture   Final    NO GROWTH 2 DAYS Performed at [facility], [address] [place], [place] [address]    Report Status PENDING  Incomplete    Coagulation Studies: Recent Labs    06/05/24 07/12/47  LABPROT 15.3*  INR 1.1    Urinalysis: No results for input(s): COLORURINE, LABSPEC, PHURINE, GLUCOSEU, HGBUR, BILIRUBINUR, KETONESUR, PROTEINUR, UROBILINOGEN, NITRITE, LEUKOCYTESUR in the last 72 hours.  Invalid input(s): APPERANCEUR    Imaging: MR ANKLE RIGHT WO CONTRAST Result Date: 06/06/2024 CLINICAL DATA:  Right foot wound. Evaluate for osteomyelitis. EXAM: MRI OF THE RIGHT ANKLE WITHOUT CONTRAST TECHNIQUE: Multiplanar, multisequence MR imaging of the ankle was performed. No intravenous contrast was administered. COMPARISON:  Radiographs 06/05/2024. FINDINGS: Forefoot findings dictated separately. TENDONS Peroneal: Intact and normally positioned.  Posteromedial: Intact and normally positioned. Anterior: The tibialis anterior tendon is moderately thickened within the distal lower leg. At the level of the soft tissue ulceration, the tendon is attenuated, suspicious for a high-grade partial tear. The extensor digitorum longus tendons appear unremarkable. The extensor hallucis longus tendon is not well visualized at the level of the ulceration. Achilles: Intact. Plantar Fascia: Intact.  Small plantar calcaneal spur. LIGAMENTS Lateral: The anterior and posterior talofibular and calcaneofibular ligaments are intact.The inferior tibiofibular ligaments appear intact. Medial: The deltoid and visualized portions of the spring ligament appear intact. CARTILAGE AND BONES Ankle Joint: Small nonspecific ankle joint effusion. The talar dome and tibial plafond appear normal, without erosive changes. Subtalar Joints/Sinus Tarsi: Unremarkable. Bones: As seen on the forefoot examination, there is abnormal marrow signal within the navicular, medial and middle cuneiform bones with decreased T1 and increased T2 marrow signal. These marrow changes are in  close proximity to soft tissue ulceration involving the dorsal and medial aspect of the midfoot, findings highly suspicious for osteomyelitis. No significant osseous abnormalities identified in the hindfoot. Other: As above, soft tissue ulceration along the dorsal and medial aspect of the midfoot with associated partial tearing of the tibialis anterior and possible the extensor hallucis longus tendons. Moderate generalized subcutaneous edema surrounding the ankle and extending into the dorsal aspect of the foot. No organized fluid collection or foreign body identified. IMPRESSION: 1. Soft tissue ulceration along the dorsal and medial aspect of the midfoot with associated partial tearing of the tibialis anterior and possible extensor hallucis longus tendons. 2. Abnormal marrow signal within the navicular, medial and middle cuneiform  bones, highly suspicious for osteomyelitis. 3. No evidence of septic arthritis or osteomyelitis in the hindfoot. 4. Nonspecific small ankle joint effusion. 5. Forefoot findings dictated separately. Electronically Signed   By: Elsie Perone M.D.   On: 06/06/2024 12:48   MR FOOT RIGHT WO CONTRAST Result Date: 06/06/2024 CLINICAL DATA:  Right foot wound.  Evaluate for osteomyelitis. EXAM: MRI OF THE RIGHT FOREFOOT WITHOUT CONTRAST TECHNIQUE: Multiplanar, multisequence MR imaging of the right forefoot was performed. No intravenous contrast was administered. COMPARISON:  Radiographs 06/05/2024 FINDINGS: Ankle and hindfoot findings are dictated separately. Bones/Joint/Cartilage Soft tissue ulceration along the dorsal medial aspect of the midfoot. Soft tissue findings are further discussed below. There is abnormal marrow signal within the navicular, middle and medial cuneiform bones with decreased T1 and increased T2 signal. Given the proximity to the overlying skin wounds, this is suspicious for osteomyelitis. There is mild T2 hyperintensity medially in the base of the 1st metatarsal. The other metatarsals and phalanges appear unremarkable. No significant joint effusions. The alignment is normal at the Lisfranc joint. Ligaments Intact Lisfranc ligament. The collateral ligaments of the metatarsophalangeal joints are intact. Muscles and Tendons Abnormality of the tibialis anterior tendon is described on ankle/hindfoot examination. The forefoot tendons appear intact, without significant tenosynovitis. Nonspecific mild T2 hyperintensity throughout the forefoot musculature. Soft tissues As above, soft tissue ulceration along the dorsal medial aspect of the midfoot. There is moderate edema throughout the dorsal aspect of the forefoot. No organized fluid collection or foreign body identified. IMPRESSION: 1. Soft tissue ulceration along the dorsal medial aspect of the midfoot with underlying marrow signal abnormality in the  navicular, middle and medial cuneiform bones suspicious for osteomyelitis. 2. Mild T2 hyperintensity medially in the base of the 1st metatarsal, nonspecific. 3. No evidence of soft tissue abscess. 4. Ankle and hindfoot findings are dictated separately. Electronically Signed   By: Elsie Perone M.D.   On: 06/06/2024 12:43   US  ARTERIAL ABI (SCREENING LOWER EXTREMITY) Result Date: 06/06/2024 CLINICAL DATA:  Foot ulcer. Hypertension. Prior vascular surgery. Hyperlipidemia. BILATERAL rest pain and claudication. EXAM: NONINVASIVE PHYSIOLOGIC VASCULAR STUDY OF BILATERAL LOWER EXTREMITIES TECHNIQUE: Evaluation of both lower extremities were performed at rest, including calculation of ankle-brachial indices with single level pressure measurements and doppler recording. COMPARISON:  None available. FINDINGS: Right ABI:  0.86 Left ABI:  0.88 Right Lower Extremity: Posterior tibial and dorsalis pedis artery waveforms are monophasic. Left Lower Extremity: Posterior tibial and dorsalis pedis artery waveforms are monophasic. 0.8-0.89 Mild PAD IMPRESSION: Decreased ankle-brachial indices indicative of mild BILATERAL lower extremity peripheral arterial disease. Electronically Signed   By: Aliene Lloyd M.D.   On: 06/06/2024 10:06   DG Foot Complete Left Result Date: 06/05/2024 EXAM: 3 OR MORE VIEW(S) XRAY OF THE LEFT FOOT 06/05/2024 05:45:00 PM COMPARISON:  None available. CLINICAL HISTORY: wound FINDINGS: BONES AND JOINTS: Diffuse bony demineralization. Indistinct deformity of the proximal phalanx fourth toe could be from acute or healed fracture. Plantar calcaneal spur. No joint dislocation. SOFT TISSUES: Dorsal midfoot skin defect slash bandaging. Soft tissue edema especially along the dorsal forefoot. IMPRESSION: 1. Indistinct deformity of the proximal phalanx of the fourth toe, possibly from acute or healed fracture. 2. Soft tissue edema along the dorsal forefoot with a dorsal midfoot skin defect and overlying  bandaging. 3. Diffuse bony demineralization. 4. Plantar calcaneal spur. Electronically signed by: Ryan Salvage MD 06/05/2024 06:50 PM EST RP Workstation: [id number]   DG Foot Complete Right Result Date: 06/05/2024 EXAM: 3 or more VIEW(S) XRAY OF THE RIGHT FOOT 06/05/2024 05:45:00 PM COMPARISON: None available. CLINICAL HISTORY: wound FINDINGS: BONES AND JOINTS: Diffuse osteopenia. The extent of osteopenia makes evaluation for focal osseous destruction challenging. Given this limitation, no focal osseous abnormality identified. No acute fracture. No joint dislocation. SOFT TISSUES: Mild to moderate diffuse soft tissue swelling. More focal soft tissue irregularity about the lateral midfoot and dorsum of the midfoot. IMPRESSION: 1. Soft tissue swelling with lateral and dorsal soft tissue irregularity, suspicious for ulcers. 2. Diffuse osteopenia, limiting sensitivity for osseous destruction by plain film. Consider pre and postcontrast MRI. Electronically signed by: Rockey Kilts MD 06/05/2024 06:46 PM EST RP Workstation: [id number]   DG Chest 2 View Result Date: 06/05/2024 EXAM: 2 VIEW(S) XRAY OF THE CHEST 06/05/2024 05:45:00 PM COMPARISON: 07/12/2023 CLINICAL HISTORY: Wound infection FINDINGS: LINES, TUBES AND DEVICES: Left upper extremity vascular stent noted. LUNGS AND PLEURA: No focal pulmonary opacity. No pleural effusion. No pneumothorax. HEART AND MEDIASTINUM: Aortic atherosclerosis. No acute abnormality of the cardiac and mediastinal silhouettes. BONES AND SOFT TISSUES: Old bilateral rib fractures. IMPRESSION: 1. No acute cardiopulmonary findings. Electronically signed by: Rockey Kilts MD 06/05/2024 06:42 PM EST RP Workstation: [id number]     Medications:    sodium chloride      [MAR Hold] piperacillin -tazobactam 2.25 g (06/07/24 0311)   [MAR Hold] vancomycin  Stopped (06/06/24 1721)    [MAR Hold] aspirin   81 mg Oral Daily   [MAR Hold] brimonidine   1 drop Both Eyes BID   And   [MAR Hold]  timolol   1 drop Both Eyes BID   [MAR Hold] Chlorhexidine  Gluconate Cloth  6 each Topical Q0600   [MAR Hold] collagenase    Topical Daily   [MAR Hold] donepezil   10 mg Oral QHS   [MAR Hold] heparin   5,000 Units Subcutaneous Q8H   [MAR Hold] latanoprost   1 drop Both Eyes QHS   [MAR Hold] memantine   5 mg Oral BID   [MAR Hold] simvastatin   10 mg Oral QHS   [MAR Hold] acetaminophen , diphenhydrAMINE , famotidine , [MAR Hold] hydrALAZINE , methylPREDNISolone  (SOLU-MEDROL ) injection, midazolam , [MAR Hold] ondansetron  (ZOFRAN ) IV, [MAR Hold] oxyCODONE -acetaminophen   Assessment/ Plan:  [name] is a 73 y.o.  male with past medical conditions including hypertension, GERD, dementia, CAD, and end-stage renal disease on hemodialysis.  Patient presents to the emergency department for evaluation of foot wound and pain and has been admitted for Foot ulcer (HCC) [L97.509] Right foot infection [L08.9]   End stage renal disease on hemodialysis. Received dialysis yesterday, UF 1L achieved. Due to holiday scheduling, patient was to receive additional treatment today. Due to scheduling conflict, patient will return to dialysis after vascular procedure to complete treatment.    2. Anemia of chronic kidney disease Lab Results  Component Value Date   HGB 9.8 (L) 06/06/2024  Hgb  acceptable for this patient. Will continue to monitor.   3. Secondary Hyperparathyroidism: with outpatient labs:none available  Lab Results  Component Value Date   PTH 101 (H) 07/26/2018   CALCIUM  8.5 (L) 06/07/2024   PHOS 2.6 07/30/2018    Calcium  acceptable, awaiting updated phos.   4. Hypotension with chronic kidney disease. Prescribed Midodrine outpatient. Blood pressure stable   LOS: 1 Danetra Glock 11/26/20259:36 AM

## 2024-06-07 NOTE — Hospital Course (Addendum)
 Anthony Houston is a 73 y.o. male with medical history significant of HTN, CAD, GERD, dementia, ESRD-HD (MWF?), GIST, HBV, who presents with foot wound and foot pain.  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  Patient had angioplasty and drug-eluting stent placed on 11/16.  Continued on antibiotics.

## 2024-06-08 DIAGNOSIS — L089 Local infection of the skin and subcutaneous tissue, unspecified: Secondary | ICD-10-CM | POA: Diagnosis not present

## 2024-06-08 DIAGNOSIS — N186 End stage renal disease: Secondary | ICD-10-CM | POA: Diagnosis not present

## 2024-06-08 DIAGNOSIS — Z992 Dependence on renal dialysis: Secondary | ICD-10-CM | POA: Diagnosis not present

## 2024-06-08 DIAGNOSIS — I10 Essential (primary) hypertension: Secondary | ICD-10-CM | POA: Diagnosis not present

## 2024-06-08 LAB — GLUCOSE, CAPILLARY: Glucose-Capillary: 88 mg/dL (ref 70–99)

## 2024-06-08 MED ORDER — SODIUM PHOSPHATES 45 MMOLE/15ML IV SOLN
15.0000 mmol | Freq: Once | INTRAVENOUS | Status: AC
  Administered 2024-06-08: 15 mmol via INTRAVENOUS
  Filled 2024-06-08: qty 5

## 2024-06-08 MED ORDER — MAGNESIUM SULFATE 2 GM/50ML IV SOLN: 2.0000 g | Freq: Once | INTRAVENOUS | Status: DC

## 2024-06-08 NOTE — Plan of Care (Signed)
 Podiatry plan of care for dorsal right proximal midfoot ulceration Patient status post angiogram right lower extremity, appreciate vascular assistance  Discussed the MRI findings, results of revascularization, location of ulceration with the patient's brother.  Due to the location of the ulcer being over the tibialis anterior tendon, if full resolution of bone infection and healing of wound occurs, his function with the limb would be limited and likely require additional intervention.  Patient's brother does express understanding of this and would like to move forward with proximal limb amputation.  Recommend consult for evaluation of below the knee amputation.  The patient's brother would like to discuss this with the surgeon before formally proceeding.  I&D for tomorrow canceled.  For now, continue IV antibiotic therapy and wound care with Santyl .  Prentice Ovens, D.P.M.

## 2024-06-08 NOTE — Progress Notes (Signed)
 [facility]  ROUNDING NOTE   Subjective:   Anthony Houston is a 73 year old male with past medical conditions including hypertension, GERD, dementia, CAD, and end-stage renal disease on hemodialysis.  Patient presents to the emergency department for evaluation of foot wound and pain and has been admitted for Foot ulcer (HCC) [L97.509] Right foot infection [L08.9]  Patient is known to our practice and receives outpatient dialysis treatments at [facility] on a TTS schedule, supervised by Dr. Dennise.    Update:  Patient underwent hemodialysis treatment yesterday as well as angiogram. Tolerated both procedures well. In good spirits this a.m.   Objective:  Vital signs in last 24 hours:  Temp:  [98.3 F (36.8 C)-99.5 F (37.5 C)] 98.7 F (37.1 C) (11/27 0716) Pulse Rate:  [63-81] 66 (11/27 0716) Resp:  [12-20] 16 (11/27 0716) BP: (114-148)/(59-80) 124/63 (11/27 0716) SpO2:  [95 %-99 %] 99 % (11/27 0716)  Weight change: 4.9 kg Filed Weights   06/06/24 1234 06/06/24 1648 06/07/24 0800  Weight: 93.3 kg 92.3 kg 98.2 kg    Intake/Output: I/O last 3 completed shifts: In: 610 [P.O.:240; IV Piggyback:370] Out: 1000 [Other:1000]   Intake/Output this shift:  No intake/output data recorded.  Physical Exam: General: NAD  Head: Normocephalic, atraumatic. Moist oral mucosal membranes  Eyes: Anicteric  Lungs:  Clear to auscultation, normal effort  Heart: Regular rate and rhythm  Abdomen:  Soft, nontender  Extremities: Trace peripheral edema.  Neurologic: Awake, alert  Skin: Warm,dry, bilateral lower extremity wound dressings  Access: Lt AVF    Basic Metabolic Panel: Recent Labs  Lab 06/05/24 1658 06/06/24 0319 06/07/24 0559 06/07/24 1648  NA 140 140 135  --   K 4.4 4.5 4.3  --   CL 99 101 97*  --   CO2 27 25 26   --   GLUCOSE 79 100* 88  --   BUN 29* 33* 22  --   CREATININE 6.96* 7.81* 5.50*  --   CALCIUM  9.3 8.7* 8.5*  --   PHOS  --   --    --  1.6*    Liver Function Tests: Recent Labs  Lab 06/05/24 1658  AST 24  ALT 11  ALKPHOS 89  BILITOT 0.4  PROT 8.3*  ALBUMIN 3.6   No results for input(s): LIPASE, AMYLASE in the last 168 hours. No results for input(s): AMMONIA in the last 168 hours.  CBC: Recent Labs  Lab 06/05/24 2132 06/06/24 0319 06/07/24 1648  WBC 8.7 9.4 6.6  NEUTROABS 6.0  --  4.0  HGB 9.8* 9.8* 10.1*  HCT 31.2* 30.1* 31.5*  MCV 87.9 86.2 85.8  PLT 329 314 303    Cardiac Enzymes: No results for input(s): CKTOTAL, CKMB, CKMBINDEX, TROPONINI in the last 168 hours.  BNP: Invalid input(s): POCBNP  CBG: Recent Labs  Lab 06/06/24 0834 06/08/24 0904  GLUCAP 84 88    Microbiology: Results for orders placed or performed during the hospital encounter of 06/05/24  Culture, blood (single) w Reflex to ID Panel     Status: None (Preliminary result)   Collection Time: 06/05/24  4:58 PM   Specimen: BLOOD  Result Value Ref Range Status   Specimen Description BLOOD LEFT ANTECUBITAL  Final   Special Requests   Final    BOTTLES DRAWN AEROBIC AND ANAEROBIC Blood Culture adequate volume   Culture   Final    NO GROWTH 2 DAYS Performed at [facility], [address], [place], [place] [address]  Report Status PENDING  Incomplete  Culture, blood (Routine X 2) w Reflex to ID Panel     Status: None (Preliminary result)   Collection Time: 06/05/24  9:39 PM   Specimen: BLOOD  Result Value Ref Range Status   Specimen Description BLOOD BLOOD RIGHT ARM  Final   Special Requests   Final    BOTTLES DRAWN AEROBIC AND ANAEROBIC Blood Culture adequate volume   Culture   Final    NO GROWTH 2 DAYS Performed at [facility], [address] [place], [place] [address]    Report Status PENDING  Incomplete  Aerobic Culture w Gram Stain (superficial specimen)     Status: None (Preliminary result)   Collection Time: 06/05/24  9:51 PM   Specimen: Wound  Result Value Ref Range  Status   Specimen Description   Final    WOUND Performed at [facility], [address] [place], [place] [address]    Special Requests   Final    NONE Performed at [facility], [address] [place], [place] [address]    Gram Stain   Final    RARE WBC PRESENT, PREDOMINANTLY PMN RARE GRAM NEGATIVE RODS RARE GRAM POSITIVE COCCI    Culture   Final    ABUNDANT PSEUDOMONAS AERUGINOSA ABUNDANT GRAM NEGATIVE RODS MODERATE KLEBSIELLA PNEUMONIAE CULTURE REINCUBATED FOR BETTER GROWTH Performed at [facility], [address], [place], [place] [address]    Report Status PENDING  Incomplete  Culture, blood (Routine X 2) w Reflex to ID Panel     Status: None (Preliminary result)   Collection Time: 06/05/24 11:25 PM   Specimen: BLOOD  Result Value Ref Range Status   Specimen Description BLOOD RIGHT ANTECUBITAL  Final   Special Requests   Final    BOTTLES DRAWN AEROBIC AND ANAEROBIC Blood Culture adequate volume   Culture   Final    NO GROWTH 3 DAYS Performed at [facility], [address] [place], [place] [address]    Report Status PENDING  Incomplete    Coagulation Studies: Recent Labs    06/05/24 06/26/2147  LABPROT 15.3*  INR 1.1    Urinalysis: No results for input(s): COLORURINE, LABSPEC, PHURINE, GLUCOSEU, HGBUR, BILIRUBINUR, KETONESUR, PROTEINUR, UROBILINOGEN, NITRITE, LEUKOCYTESUR in the last 72 hours.  Invalid input(s): APPERANCEUR    Imaging: PERIPHERAL VASCULAR CATHETERIZATION Result Date: 06/07/2024 See surgical note for result.  MR ANKLE RIGHT WO CONTRAST Result Date: 06/06/2024 CLINICAL DATA:  Right foot wound. Evaluate for osteomyelitis. EXAM: MRI OF THE RIGHT ANKLE WITHOUT CONTRAST TECHNIQUE: Multiplanar, multisequence MR imaging of the ankle was performed. No intravenous contrast was administered. COMPARISON:  Radiographs 06/05/2024. FINDINGS: Forefoot findings dictated separately. TENDONS  Peroneal: Intact and normally positioned. Posteromedial: Intact and normally positioned. Anterior: The tibialis anterior tendon is moderately thickened within the distal lower leg. At the level of the soft tissue ulceration, the tendon is attenuated, suspicious for a high-grade partial tear. The extensor digitorum longus tendons appear unremarkable. The extensor hallucis longus tendon is not well visualized at the level of the ulceration. Achilles: Intact. Plantar Fascia: Intact.  Small plantar calcaneal spur. LIGAMENTS Lateral: The anterior and posterior talofibular and calcaneofibular ligaments are intact.The inferior tibiofibular ligaments appear intact. Medial: The deltoid and visualized portions of the spring ligament appear intact. CARTILAGE AND BONES Ankle Joint: Small nonspecific ankle joint effusion. The talar dome and tibial plafond appear normal, without erosive changes. Subtalar Joints/Sinus Tarsi: Unremarkable. Bones: As seen on the forefoot examination,  there is abnormal marrow signal within the navicular, medial and middle cuneiform bones with decreased T1 and increased T2 marrow signal. These marrow changes are in close proximity to soft tissue ulceration involving the dorsal and medial aspect of the midfoot, findings highly suspicious for osteomyelitis. No significant osseous abnormalities identified in the hindfoot. Other: As above, soft tissue ulceration along the dorsal and medial aspect of the midfoot with associated partial tearing of the tibialis anterior and possible the extensor hallucis longus tendons. Moderate generalized subcutaneous edema surrounding the ankle and extending into the dorsal aspect of the foot. No organized fluid collection or foreign body identified. IMPRESSION: 1. Soft tissue ulceration along the dorsal and medial aspect of the midfoot with associated partial tearing of the tibialis anterior and possible extensor hallucis longus tendons. 2. Abnormal marrow signal within  the navicular, medial and middle cuneiform bones, highly suspicious for osteomyelitis. 3. No evidence of septic arthritis or osteomyelitis in the hindfoot. 4. Nonspecific small ankle joint effusion. 5. Forefoot findings dictated separately. Electronically Signed   By: Elsie Perone M.D.   On: 06/06/2024 12:48   MR FOOT RIGHT WO CONTRAST Result Date: 06/06/2024 CLINICAL DATA:  Right foot wound.  Evaluate for osteomyelitis. EXAM: MRI OF THE RIGHT FOREFOOT WITHOUT CONTRAST TECHNIQUE: Multiplanar, multisequence MR imaging of the right forefoot was performed. No intravenous contrast was administered. COMPARISON:  Radiographs 06/05/2024 FINDINGS: Ankle and hindfoot findings are dictated separately. Bones/Joint/Cartilage Soft tissue ulceration along the dorsal medial aspect of the midfoot. Soft tissue findings are further discussed below. There is abnormal marrow signal within the navicular, middle and medial cuneiform bones with decreased T1 and increased T2 signal. Given the proximity to the overlying skin wounds, this is suspicious for osteomyelitis. There is mild T2 hyperintensity medially in the base of the 1st metatarsal. The other metatarsals and phalanges appear unremarkable. No significant joint effusions. The alignment is normal at the Lisfranc joint. Ligaments Intact Lisfranc ligament. The collateral ligaments of the metatarsophalangeal joints are intact. Muscles and Tendons Abnormality of the tibialis anterior tendon is described on ankle/hindfoot examination. The forefoot tendons appear intact, without significant tenosynovitis. Nonspecific mild T2 hyperintensity throughout the forefoot musculature. Soft tissues As above, soft tissue ulceration along the dorsal medial aspect of the midfoot. There is moderate edema throughout the dorsal aspect of the forefoot. No organized fluid collection or foreign body identified. IMPRESSION: 1. Soft tissue ulceration along the dorsal medial aspect of the midfoot with  underlying marrow signal abnormality in the navicular, middle and medial cuneiform bones suspicious for osteomyelitis. 2. Mild T2 hyperintensity medially in the base of the 1st metatarsal, nonspecific. 3. No evidence of soft tissue abscess. 4. Ankle and hindfoot findings are dictated separately. Electronically Signed   By: Elsie Perone M.D.   On: 06/06/2024 12:43     Medications:    sodium chloride  10 mL/hr at 06/08/24 0328   piperacillin -tazobactam 2.25 g (06/08/24 0330)   sodium PHOSPHATE  IVPB (in mmol) 15 mmol (06/08/24 0913)   [START ON 06/10/2024] vancomycin  Stopped (06/07/24 1524)    aspirin   81 mg Oral Daily   brimonidine   1 drop Both Eyes BID   And   timolol   1 drop Both Eyes BID   Chlorhexidine  Gluconate Cloth  6 each Topical Q0600   clopidogrel   75 mg Oral Daily   collagenase    Topical Daily   donepezil   10 mg Oral QHS   heparin   5,000 Units Subcutaneous Q8H   latanoprost   1 drop Both Eyes QHS  memantine   5 mg Oral BID   simvastatin   10 mg Oral QHS   sodium chloride , acetaminophen , Heparin  (Porcine) in NaCl, hydrALAZINE , iodixanol , ondansetron  (ZOFRAN ) IV, oxyCODONE -acetaminophen   Assessment/ Plan:  Anthony Houston is a 73 y.o.  male with past medical conditions including hypertension, GERD, dementia, CAD, and end-stage renal disease on hemodialysis.  Patient presents to the emergency department for evaluation of foot wound and pain and has been admitted for Foot ulcer (HCC) [L97.509] Right foot infection [L08.9]   End stage renal disease on hemodialysis.  Dialysis was cut short yesterday as he had to be taken down for angiogram.  We will plan for hemodialysis treatment again tomorrow.   2. Anemia of chronic kidney disease Lab Results  Component Value Date   HGB 10.1 (L) 06/07/2024  Hemoglobin within acceptable range.  Continue to monitor CBC.  3. Secondary Hyperparathyroidism: with outpatient labs:none available  Lab Results  Component Value Date    PTH 101 (H) 07/26/2018   CALCIUM  8.5 (L) 06/07/2024   PHOS 1.6 (L) 06/07/2024    Phosphorus low at 1.6.  Sodium phosphate  ordered.  4. Hypotension with chronic kidney disease. Prescribed Midodrine outpatient. Blood pressure currently 120/63.   LOS: 2 Lakeasha Petion 11/27/202510:53 AM

## 2024-06-08 NOTE — Progress Notes (Signed)
  Progress Note   Patient: Anthony Houston FMW:[id number] DOB: 06/01/1951 DOA: 06/05/2024     2 DOS: the patient was seen and examined on 06/08/2024   Brief hospital course: Anthony Houston is a 73 y.o. male with medical history significant of HTN, CAD, GERD, dementia, ESRD-HD (MWF?), GIST, HBV, who presents with foot wound and foot pain.  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  Vascular surgeon and podiatry on board.    Principal Problem:   Right foot infection with wound Active Problems:   ESRD on hemodialysis (HCC)   Hypertension   CAD (coronary artery disease)   Anemia in ESRD (end-stage renal disease) (HCC)   Diarrhea   Dementia (HCC)   Obesity (BMI 30-39.9)   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)   Hypophosphatemia   Assessment and Plan: Acute osteomyelitis of right foot Peripheral arterial disease. 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  Continue empiric antibiotics with vancomycin  and Zosyn  Discussed with vascular surgery, patient had angioplasty and drug-eluting stent placed. Discussed with podiatry, patient may need BKA.  Patient condition stable today.   ESRD on hemodialysis (MWF?) Continue dialysis per nephrology.   Hypertension Continue Coreg  and as needed hydralazine    CAD (coronary artery disease) Continue Zocor  and aspirin    Anemia in ESRD (end-stage renal disease) (HCC): Hemoglobin 9.8 (11.3 on 07/02/2023) Hb stable   Diarrhea Condition seem to be improved, not able to collect stool   Dementia [facility]): No behavior disturbance -Donepezil  and Namenda    Obesity (BMI 30-39.9): Patient has Obesity Class II, with body weight 130.6 Kg and BMI 38  kg/m2.  Diet and excise.          Subjective:  Patient doing well today, some pain in the leg.  Physical Exam: Vitals:   06/07/24 1610 06/07/24 1932  06/08/24 0448 06/08/24 0716  BP: 130/80 116/67 123/67 124/63  Pulse: 70 71 81 66  Resp: 16 17 17 16   Temp: 98.6 F (37 C) 99.2 F (37.3 C) 99.5 F (37.5 C) 98.7 F (37.1 C)  TempSrc:    Oral  SpO2: 97% 99% 98% 99%  Weight:      Height:       General exam: Appears calm and comfortable  Respiratory system: Clear to auscultation. Respiratory effort normal. Cardiovascular system: S1 & S2 heard, RRR. No JVD, murmurs, rubs, gallops or clicks. No pedal edema. Gastrointestinal system: Abdomen is nondistended, soft and nontender. No organomegaly or masses felt. Normal bowel sounds heard. Central nervous system: Alert and oriented. No focal neurological deficits. Extremities: Symmetric 5 x 5 power. Skin: No rashes, lesions or ulcers Psychiatry: Judgement and insight appear normal. Mood & affect appropriate.    Data Reviewed:  Lab results reviewed.  Family Communication: None  Disposition: Status is: Inpatient Remains inpatient appropriate because: Severity of disease, inpatient procedure     Time spent: 35 minutes  Author: Murvin Mana, MD 06/08/2024 12:25 PM  For on call review [email or web address].

## 2024-06-08 NOTE — Plan of Care (Signed)
 ?  Problem: Clinical Measurements: ?Goal: Ability to avoid or minimize complications of infection will improve ?Outcome: Progressing ?  ?Problem: Skin Integrity: ?Goal: Skin integrity will improve ?Outcome: Progressing ?  ?

## 2024-06-09 ENCOUNTER — Encounter
Admission: EM | Disposition: A | Discharge status: Skilled Nursing Facility | Payer: Self-pay | Source: Skilled Nursing Facility | Attending: Internal Medicine

## 2024-06-09 DIAGNOSIS — N186 End stage renal disease: Secondary | ICD-10-CM | POA: Diagnosis not present

## 2024-06-09 DIAGNOSIS — Z992 Dependence on renal dialysis: Secondary | ICD-10-CM | POA: Diagnosis not present

## 2024-06-09 DIAGNOSIS — L089 Local infection of the skin and subcutaneous tissue, unspecified: Secondary | ICD-10-CM | POA: Diagnosis not present

## 2024-06-09 LAB — CBC WITH DIFFERENTIAL/PLATELET
Abs Immature Granulocytes: 0.01 K/uL (ref 0.00–0.07)
Basophils Absolute: 0 K/uL (ref 0.0–0.1)
Basophils Relative: 1 %
Eosinophils Absolute: 0.4 K/uL (ref 0.0–0.5)
Eosinophils Relative: 6 %
HCT: 26.3 % — ABNORMAL LOW (ref 39.0–52.0)
Hemoglobin: 8.4 g/dL — ABNORMAL LOW (ref 13.0–17.0)
Immature Granulocytes: 0 %
Lymphocytes Relative: 28 %
Lymphs Abs: 1.7 K/uL (ref 0.7–4.0)
MCH: 27.8 pg (ref 26.0–34.0)
MCHC: 31.9 g/dL (ref 30.0–36.0)
MCV: 87.1 fL (ref 80.0–100.0)
Monocytes Absolute: 0.6 K/uL (ref 0.1–1.0)
Monocytes Relative: 10 %
Neutro Abs: 3.3 K/uL (ref 1.7–7.7)
Neutrophils Relative %: 55 %
Platelets: 260 K/uL (ref 150–400)
RBC: 3.02 MIL/uL — ABNORMAL LOW (ref 4.22–5.81)
RDW: 15.1 % (ref 11.5–15.5)
WBC: 5.9 K/uL (ref 4.0–10.5)
nRBC: 0 % (ref 0.0–0.2)

## 2024-06-09 LAB — RENAL FUNCTION PANEL
Albumin: 2.9 g/dL — ABNORMAL LOW (ref 3.5–5.0)
Anion gap: 13 (ref 5–15)
BUN: 33 mg/dL — ABNORMAL HIGH (ref 8–23)
CO2: 27 mmol/L (ref 22–32)
Calcium: 8.3 mg/dL — ABNORMAL LOW (ref 8.9–10.3)
Chloride: 97 mmol/L — ABNORMAL LOW (ref 98–111)
Creatinine, Ser: 7.33 mg/dL — ABNORMAL HIGH (ref 0.61–1.24)
GFR, Estimated: 7 mL/min — ABNORMAL LOW (ref >60)
Glucose, Bld: 125 mg/dL — ABNORMAL HIGH (ref 70–99)
Phosphorus: 3.9 mg/dL (ref 2.5–4.6)
Potassium: 4.3 mmol/L (ref 3.5–5.1)
Sodium: 136 mmol/L (ref 135–145)

## 2024-06-09 LAB — GLUCOSE, CAPILLARY: Glucose-Capillary: 92 mg/dL (ref 70–99)

## 2024-06-09 SURGERY — IRRIGATION AND DEBRIDEMENT FOOT
Anesthesia: Choice | Laterality: Right

## 2024-06-09 NOTE — Progress Notes (Signed)
 Patient refused 0500 dressing change. Writer tried to provide education to patient regarding staying on track for dressing changes. Patient states It is too early. Can I do it later?  Writer will pass on to oncoming nurse.

## 2024-06-09 NOTE — Progress Notes (Signed)
  Progress Note   Patient: Anthony Houston FMW:[id number] DOB: 1951/04/14 DOA: 06/05/2024     3 DOS: the patient was seen and examined on 06/09/2024   Brief hospital course: COTY LARSH is a 73 y.o. male with medical history significant of HTN, CAD, GERD, dementia, ESRD-HD (MWF?), GIST, HBV, who presents with foot wound and foot pain.  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  Patient had angioplasty and drug-eluting stent placed on 11/16.  Continued on antibiotics.   Principal Problem:   Right foot infection with wound Active Problems:   ESRD on hemodialysis (HCC)   Hypertension   CAD (coronary artery disease)   Anemia in ESRD (end-stage renal disease) (HCC)   Diarrhea   Dementia (HCC)   Obesity (BMI 30-39.9)   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)   Hypophosphatemia   Assessment and Plan: Acute osteomyelitis of right foot Peripheral arterial disease. 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  Continue empiric antibiotics with vancomycin  and Zosyn  Discussed with vascular surgery, patient had angioplasty and drug-eluting stent placed. Wound culture grew Pseudomonas, Klebsiella as well as Enterobacter. Vascular surgery is considering BKA.   ESRD on hemodialysis (MWF?) Hypophosphatemia. Anemia of end-stage renal disease. Continue dialysis per nephrology. Received sodium phosphate  11/27.  Hemoglobin stable.   Hypertension Continue Coreg  and as needed hydralazine    CAD (coronary artery disease) Continue Zocor  and aspirin    Diarrhea Condition seem to be improved, not able to collect stool   Dementia [facility]): No behavior disturbance -Donepezil  and Namenda    Obesity (BMI 30-39.9): Patient has Obesity Class II, with body weight 130.6 Kg and BMI 38  kg/m2.  Diet and excise.       Subjective:  Patient doing well  today, no significant pain  Physical Exam: Vitals:   06/08/24 1518 06/08/24 2122 06/09/24 0501 06/09/24 0817  BP: 114/67 115/73 116/69 123/73  Pulse: 69 82 66 66  Resp: 16 17 17 15   Temp: 98.6 F (37 C) 99.7 F (37.6 C) 99.3 F (37.4 C) (!) 97.4 F (36.3 C)  TempSrc: Oral     SpO2: 99% 100% 100% 97%  Weight:      Height:       General exam: Appears calm and comfortable  Respiratory system: Clear to auscultation. Respiratory effort normal. Cardiovascular system: S1 & S2 heard, RRR. No JVD, murmurs, rubs, gallops or clicks. No pedal edema. Gastrointestinal system: Abdomen is nondistended, soft and nontender. No organomegaly or masses felt. Normal bowel sounds heard. Central nervous system: Alert and oriented x2. No focal neurological deficits. Extremities: Symmetric 5 x 5 power. Skin: No rashes, lesions or ulcers Psychiatry: Mood & affect appropriate.    Data Reviewed:  Lab results reviewed.  Family Communication: None  Disposition: Status is: Inpatient Remains inpatient appropriate because: Severity of disease, IV treatment, inpatient procedure     Time spent: 35 minutes  Author: Murvin Mana, MD 06/09/2024 10:42 AM  For on call review [email or web address].

## 2024-06-09 NOTE — Plan of Care (Signed)

## 2024-06-09 NOTE — Progress Notes (Signed)
 [facility]  ROUNDING NOTE   Subjective:   Anthony Houston is a 73 year old male with past medical conditions including hypertension, GERD, dementia, CAD, and end-stage renal disease on hemodialysis.  Patient presents to the emergency department for evaluation of foot wound and pain and has been admitted for Foot ulcer (HCC) [L97.509] Right foot infection [L08.9]  Patient is known to our practice and receives outpatient dialysis treatments at [facility] on a TTS schedule, supervised by Dr. Dennise.    Update:  Patient seen sitting up in bed Remains on room air  Denies pain or discomfort   Objective:  Vital signs in last 24 hours:  Temp:  [97.4 F (36.3 C)-99.7 F (37.6 C)] 97.4 F (36.3 C) (11/28 0817) Pulse Rate:  [66-82] 66 (11/28 0817) Resp:  [15-17] 15 (11/28 0817) BP: (114-123)/(67-73) 123/73 (11/28 0817) SpO2:  [97 %-100 %] 97 % (11/28 0817)  Weight change:  Filed Weights   06/06/24 1234 06/06/24 1648 06/07/24 0800  Weight: 93.3 kg 92.3 kg 98.2 kg    Intake/Output: I/O last 3 completed shifts: In: 290 [P.O.:240; IV Piggyback:50] Out: -    Intake/Output this shift:  No intake/output data recorded.  Physical Exam: General: NAD  Head: Normocephalic, atraumatic. Moist oral mucosal membranes  Eyes: Anicteric  Lungs:  Clear to auscultation, normal effort  Heart: Regular rate and rhythm  Abdomen:  Soft, nontender  Extremities: Trace peripheral edema.  Neurologic: Awake, alert  Skin: Warm,dry, bilateral lower extremity wound dressings  Access: Lt AVF    Basic Metabolic Panel: Recent Labs  Lab 06/05/24 1658 06/06/24 0319 06/07/24 0559 06/07/24 1648  NA 140 140 135  --   K 4.4 4.5 4.3  --   CL 99 101 97*  --   CO2 27 25 26   --   GLUCOSE 79 100* 88  --   BUN 29* 33* 22  --   CREATININE 6.96* 7.81* 5.50*  --   CALCIUM  9.3 8.7* 8.5*  --   PHOS  --   --   --  1.6*    Liver Function Tests: Recent Labs  Lab 06/05/24 1658   AST 24  ALT 11  ALKPHOS 89  BILITOT 0.4  PROT 8.3*  ALBUMIN 3.6   No results for input(s): LIPASE, AMYLASE in the last 168 hours. No results for input(s): AMMONIA in the last 168 hours.  CBC: Recent Labs  Lab 06/05/24 2132 06/06/24 0319 06/07/24 1648  WBC 8.7 9.4 6.6  NEUTROABS 6.0  --  4.0  HGB 9.8* 9.8* 10.1*  HCT 31.2* 30.1* 31.5*  MCV 87.9 86.2 85.8  PLT 329 314 303    Cardiac Enzymes: No results for input(s): CKTOTAL, CKMB, CKMBINDEX, TROPONINI in the last 168 hours.  BNP: Invalid input(s): POCBNP  CBG: Recent Labs  Lab 06/06/24 0834 06/08/24 0904 06/09/24 1002  GLUCAP 84 88 92    Microbiology: Results for orders placed or performed during the hospital encounter of 06/05/24  Culture, blood (single) w Reflex to ID Panel     Status: None (Preliminary result)   Collection Time: 06/05/24  4:58 PM   Specimen: BLOOD  Result Value Ref Range Status   Specimen Description BLOOD LEFT ANTECUBITAL  Final   Special Requests   Final    BOTTLES DRAWN AEROBIC AND ANAEROBIC Blood Culture adequate volume   Culture   Final    NO GROWTH 3 DAYS Performed at [facility], [address], [place], [place] [address]  Report Status PENDING  Incomplete  Culture, blood (Routine X 2) w Reflex to ID Panel     Status: None (Preliminary result)   Collection Time: 06/05/24  9:39 PM   Specimen: BLOOD  Result Value Ref Range Status   Specimen Description BLOOD BLOOD RIGHT ARM  Final   Special Requests   Final    BOTTLES DRAWN AEROBIC AND ANAEROBIC Blood Culture adequate volume   Culture   Final    NO GROWTH 3 DAYS Performed at [facility], [address], [place], [place] [address]    Report Status PENDING  Incomplete  Aerobic Culture w Gram Stain (superficial specimen)     Status: None (Preliminary result)   Collection Time: 06/05/24  9:51 PM   Specimen: Wound  Result Value Ref Range Status   Specimen Description   Final     WOUND Performed at [facility], [address], [place], [place] [address]    Special Requests   Final    NONE Performed at [facility], [address] [place], [place] [address]    Gram Stain   Final    RARE WBC PRESENT, PREDOMINANTLY PMN RARE GRAM NEGATIVE RODS RARE GRAM POSITIVE COCCI    Culture   Final    ABUNDANT PSEUDOMONAS AERUGINOSA ABUNDANT ENTEROBACTER CLOACAE MODERATE KLEBSIELLA PNEUMONIAE SUSCEPTIBILITIES TO FOLLOW Performed at [facility], [address], [place], [place] [address]    Report Status PENDING  Incomplete   Organism ID, Bacteria ENTEROBACTER CLOACAE  Final   Organism ID, Bacteria KLEBSIELLA PNEUMONIAE  Final      Susceptibility   Enterobacter cloacae - MIC*    CEFEPIME  <=0.12 SENSITIVE Sensitive     ERTAPENEM <=0.12 SENSITIVE Sensitive     CIPROFLOXACIN <=0.06 SENSITIVE Sensitive     GENTAMICIN <=1 SENSITIVE Sensitive     MEROPENEM <=0.25 SENSITIVE Sensitive     TRIMETH/SULFA <=20 SENSITIVE Sensitive     PIP/TAZO Value in next row Sensitive      <=4 SENSITIVEThis is a modified FDA-approved test that has been validated and its performance characteristics determined by the reporting laboratory.  This laboratory is certified under the Clinical Laboratory Improvement Amendments CLIA as qualified to perform high complexity clinical laboratory testing.    * ABUNDANT ENTEROBACTER CLOACAE   Klebsiella pneumoniae - MIC*    AMPICILLIN Value in next row Resistant      <=4 SENSITIVEThis is a modified FDA-approved test that has been validated and its performance characteristics determined by the reporting laboratory.  This laboratory is certified under the Clinical Laboratory Improvement Amendments CLIA as qualified to perform high complexity clinical laboratory testing.    CEFAZOLIN  (NON-URINE) Value in next row Sensitive      <=4 SENSITIVEThis is a modified FDA-approved test that has been validated and its performance characteristics  determined by the reporting laboratory.  This laboratory is certified under the Clinical Laboratory Improvement Amendments CLIA as qualified to perform high complexity clinical laboratory testing.    CEFEPIME  Value in next row Sensitive      <=4 SENSITIVEThis is a modified FDA-approved test that has been validated and its performance characteristics determined by the reporting laboratory.  This laboratory is certified under the Clinical Laboratory Improvement Amendments CLIA as qualified to perform high complexity clinical laboratory testing.    ERTAPENEM Value in next row Sensitive      <=4 SENSITIVEThis is a modified FDA-approved test that has been validated and its performance characteristics determined by the reporting laboratory.  This laboratory is certified under the Clinical Laboratory Improvement Amendments CLIA as qualified to perform high complexity clinical laboratory testing.    CEFTRIAXONE  Value in next row Sensitive      <=4 SENSITIVEThis is a modified FDA-approved test that has been validated and its performance characteristics determined by the reporting laboratory.  This laboratory is certified under the Clinical Laboratory Improvement Amendments CLIA as qualified to perform high complexity clinical laboratory testing.    CIPROFLOXACIN Value in next row Sensitive      <=4 SENSITIVEThis is a modified FDA-approved test that has been validated and its performance characteristics determined by the reporting laboratory.  This laboratory is certified under the Clinical Laboratory Improvement Amendments CLIA as qualified to perform high complexity clinical laboratory testing.    GENTAMICIN Value in next row Sensitive      <=4 SENSITIVEThis is a modified FDA-approved test that has been validated and its performance characteristics determined by the reporting laboratory.  This laboratory is certified under the Clinical Laboratory Improvement Amendments CLIA as qualified to perform high complexity  clinical laboratory testing.    MEROPENEM Value in next row Sensitive      <=4 SENSITIVEThis is a modified FDA-approved test that has been validated and its performance characteristics determined by the reporting laboratory.  This laboratory is certified under the Clinical Laboratory Improvement Amendments CLIA as qualified to perform high complexity clinical laboratory testing.    TRIMETH/SULFA Value in next row Sensitive      <=4 SENSITIVEThis is a modified FDA-approved test that has been validated and its performance characteristics determined by the reporting laboratory.  This laboratory is certified under the Clinical Laboratory Improvement Amendments CLIA as qualified to perform high complexity clinical laboratory testing.    AMPICILLIN/SULBACTAM Value in next row Sensitive      <=4 SENSITIVEThis is a modified FDA-approved test that has been validated and its performance characteristics determined by the reporting laboratory.  This laboratory is certified under the Clinical Laboratory Improvement Amendments CLIA as qualified to perform high complexity clinical laboratory testing.    PIP/TAZO Value in next row Sensitive      <=4 SENSITIVEThis is a modified FDA-approved test that has been validated and its performance characteristics determined by the reporting laboratory.  This laboratory is certified under the Clinical Laboratory Improvement Amendments CLIA as qualified to perform high complexity clinical laboratory testing.    * MODERATE KLEBSIELLA PNEUMONIAE  Culture, blood (Routine X 2) w Reflex to ID Panel     Status: None (Preliminary result)   Collection Time: 06/05/24 11:25 PM   Specimen: BLOOD  Result Value Ref Range Status   Specimen Description BLOOD RIGHT ANTECUBITAL  Final   Special Requests   Final    BOTTLES DRAWN AEROBIC AND ANAEROBIC Blood Culture adequate volume   Culture   Final    NO GROWTH 4 DAYS Performed at [facility], [address] [place], [place]  [address]    Report Status PENDING  Incomplete    Coagulation Studies: No results for input(s): LABPROT, INR in the last 72 hours.   Urinalysis: No results for input(s): COLORURINE, LABSPEC, PHURINE, GLUCOSEU, HGBUR, BILIRUBINUR, KETONESUR, PROTEINUR, UROBILINOGEN, NITRITE, LEUKOCYTESUR in the last 72 hours.  Invalid input(s): APPERANCEUR    Imaging: No results found.    Medications:    piperacillin -tazobactam 2.25 g (06/09/24 0409)   [START ON 06/10/2024] vancomycin  Stopped (06/07/24 1524)    aspirin   81 mg Oral Daily   brimonidine   1 drop Both Eyes BID  And   timolol   1 drop Both Eyes BID   Chlorhexidine  Gluconate Cloth  6 each Topical Q0600   clopidogrel   75 mg Oral Daily   collagenase    Topical Daily   donepezil   10 mg Oral QHS   heparin   5,000 Units Subcutaneous Q8H   latanoprost   1 drop Both Eyes QHS   memantine   5 mg Oral BID   simvastatin   10 mg Oral QHS   acetaminophen , Heparin  (Porcine) in NaCl, hydrALAZINE , iodixanol , ondansetron  (ZOFRAN ) IV, oxyCODONE -acetaminophen   Assessment/ Plan:  [name] is a 73 y.o.  male with past medical conditions including hypertension, GERD, dementia, CAD, and end-stage renal disease on hemodialysis.  Patient presents to the emergency department for evaluation of foot wound and pain and has been admitted for Foot ulcer (HCC) [L97.509] Right foot infection [L08.9]   End stage renal disease on hemodialysis.  Patient will receive short dialysis treatment later today and resume outpatient schedule tomorrow.    2. Anemia of chronic kidney disease Lab Results  Component Value Date   HGB 10.1 (L) 06/07/2024  Hemoglobin 10.1.  Continue to monitor CBC and need for ESA.  3. Secondary Hyperparathyroidism: with outpatient labs:none available  Lab Results  Component Value Date   PTH 101 (H) 07/26/2018   CALCIUM  8.5 (L) 06/07/2024   PHOS 1.6 (L) 06/07/2024    Will obtain updated labs during  dialysis.  4. Hypotension with chronic kidney disease. Prescribed Midodrine outpatient. Blood pressure 123/73   LOS: 3 Keidra Withers 11/28/202510:36 AM

## 2024-06-09 NOTE — Progress Notes (Signed)
   06/09/24 1516  Vitals  Temp 97.8 F (36.6 C)  BP 124/80  MAP (mmHg) 92  Pulse Rate 64  ECG Heart Rate 60  Resp 18  Weight 96.6 kg  Oxygen Therapy  SpO2 99 %  Patient Activity (if Appropriate) In bed  Pulse Oximetry Type Continuous  Oximetry Probe Site Changed No  During Treatment Monitoring  Blood Flow Rate (mL/min) 199 mL/min  Arterial Pressure (mmHg) -100.4 mmHg  Venous Pressure (mmHg) 99.39 mmHg  TMP (mmHg) 5.25 mmHg  Ultrafiltration Rate (mL/min) 600 mL/min  Dialysate Flow Rate (mL/min) 300 ml/min  Dialysate Potassium Concentration 3  Dialysate Calcium  Concentration 2.5  Duration of HD Treatment -hour(s) 3 hour(s)  Cumulative Fluid Removed (mL) per Treatment  1500.11  Post Treatment  Dialyzer Clearance Clear  Liters Processed 67.6  Fluid Removed (mL) 1500 mL  Tolerated HD Treatment Yes  Post-Hemodialysis Comments Goal has been met. Tx has been tolerated. VS have remained stable. Thrill and bruit are present. No verbalized  concerns. Patient is stable to return to his assigned room.

## 2024-06-10 DIAGNOSIS — L089 Local infection of the skin and subcutaneous tissue, unspecified: Secondary | ICD-10-CM | POA: Diagnosis not present

## 2024-06-10 DIAGNOSIS — I10 Essential (primary) hypertension: Secondary | ICD-10-CM | POA: Diagnosis not present

## 2024-06-10 DIAGNOSIS — Z992 Dependence on renal dialysis: Secondary | ICD-10-CM | POA: Diagnosis not present

## 2024-06-10 DIAGNOSIS — N186 End stage renal disease: Secondary | ICD-10-CM | POA: Diagnosis not present

## 2024-06-10 LAB — AEROBIC CULTURE W GRAM STAIN (SUPERFICIAL SPECIMEN)

## 2024-06-10 LAB — CULTURE, BLOOD (ROUTINE X 2)
Culture: NO GROWTH
Special Requests: ADEQUATE

## 2024-06-10 LAB — GLUCOSE, CAPILLARY: Glucose-Capillary: 115 mg/dL — ABNORMAL HIGH (ref 70–99)

## 2024-06-10 NOTE — Progress Notes (Signed)
  Progress Note   Patient: Anthony Houston FMW:[id number] DOB: 1951/03/01 DOA: 06/05/2024     4 DOS: the patient was seen and examined on 06/10/2024   Brief hospital course: Anthony Houston is a 73 y.o. male with medical history significant of HTN, CAD, GERD, dementia, ESRD-HD (MWF?), GIST, HBV, who presents with foot wound and foot pain.  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  Patient had angioplasty and drug-eluting stent placed on 11/16.  Continued on antibiotics.   Principal Problem:   Right foot infection with wound Active Problems:   ESRD on hemodialysis (HCC)   Hypertension   CAD (coronary artery disease)   Anemia in ESRD (end-stage renal disease) (HCC)   Diarrhea   Dementia (HCC)   Obesity (BMI 30-39.9)   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)   Hypophosphatemia   Assessment and Plan: Acute osteomyelitis of right foot Peripheral arterial disease. 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  Continue empiric antibiotics with vancomycin  and Zosyn  Discussed with vascular surgery, patient had angioplasty and drug-eluting stent placed. Wound culture grew Pseudomonas, Klebsiella as well as Enterobacter. Vascular surgery is considering BKA.  Vancomycin  discontinued.  Continue to follow.   ESRD on hemodialysis (MWF?) Hypophosphatemia. Anemia of end-stage renal disease. Continue dialysis per nephrology. Received sodium phosphate  11/27.  Hemoglobin stable.   Hypertension Continue Coreg  and as needed hydralazine    CAD (coronary artery disease) Continue Zocor  and aspirin    Diarrhea Condition seem to be improved, not able to collect stool   Dementia [facility]): No behavior disturbance -Donepezil  and Namenda    Obesity (BMI 30-39.9): Patient has Obesity Class II, with body weight 130.6 Kg and BMI 38  kg/m2.  Diet and  excise.       Subjective:  Patient doing well today, denies any pain, no shortness of breath.  Physical Exam: Vitals:   06/10/24 1030 06/10/24 1054 06/10/24 1055 06/10/24 1136  BP: 99/70 109/70 115/73 130/82  Pulse: 62 63 72 69  Resp: 17 20 17 16   Temp:   97.8 F (36.6 C) 98.4 F (36.9 C)  TempSrc:      SpO2: 99% 100% 100% 99%  Weight:   93.3 kg   Height:       General exam: Appears calm and comfortable  Respiratory system: Clear to auscultation. Respiratory effort normal. Cardiovascular system: S1 & S2 heard, RRR. No JVD, murmurs, rubs, gallops or clicks. No pedal edema. Gastrointestinal system: Abdomen is nondistended, soft and nontender. No organomegaly or masses felt. Normal bowel sounds heard. Central nervous system: Alert and oriented x2. No focal neurological deficits. Extremities: Symmetric 5 x 5 power. Skin: Right mid foot ulcer Psychiatry: Judgement and insight appear normal. Mood & affect appropriate.    Data Reviewed:  Lab results reviewed.  Family Communication: None  Disposition: Status is: Inpatient Remains inpatient appropriate because: Severity of disease, IV treatment, pending inpatient procedure.     Time spent: 35 minutes  Author: Murvin Mana, MD 06/10/2024 12:15 PM  For on call review [email or web address].

## 2024-06-10 NOTE — Plan of Care (Signed)

## 2024-06-10 NOTE — Progress Notes (Signed)
 [facility]  ROUNDING NOTE   Subjective:   Anthony Houston is a 73 year old male with past medical conditions including hypertension, GERD, dementia, CAD, and end-stage renal disease on hemodialysis.  Patient presents to the emergency department for evaluation of foot wound and pain and has been admitted for Foot ulcer (HCC) [L97.509] Right foot infection [L08.9]  Patient is known to our practice and receives outpatient dialysis treatments at [facility] on a TTS schedule, supervised by Dr. Dennise.    Update:  Patient seen and evaluated during dialysis   HEMODIALYSIS FLOWSHEET:  Blood Flow Rate (mL/min): 200 mL/min Arterial Pressure (mmHg): -94.95 mmHg Venous Pressure (mmHg): 100.4 mmHg TMP (mmHg): 5.25 mmHg Ultrafiltration Rate (mL/min): 598 mL/min Dialysate Flow Rate (mL/min): 300 ml/min  Tolerating treatment well Denies pain or discomfort Remains on room air  Objective:  Vital signs in last 24 hours:  Temp:  [97.8 F (36.6 C)-98.6 F (37 C)] 98.4 F (36.9 C) (11/29 1136) Pulse Rate:  [56-72] 69 (11/29 1136) Resp:  [15-20] 16 (11/29 1136) BP: (99-140)/(66-113) 130/82 (11/29 1136) SpO2:  [98 %-100 %] 99 % (11/29 1136) Weight:  [93.3 kg-96.6 kg] 93.3 kg (11/29 1055)  Weight change:  Filed Weights   06/09/24 1516 06/10/24 0734 06/10/24 1055  Weight: 96.6 kg 94.6 kg 93.3 kg    Intake/Output: I/O last 3 completed shifts: In: -  Out: 1500 [Other:1500]   Intake/Output this shift:  Total I/O In: -  Out: 1500 [Other:1500]  Physical Exam: General: NAD  Head: Normocephalic, atraumatic. Moist oral mucosal membranes  Eyes: Anicteric  Lungs:  Clear to auscultation, normal effort  Heart: Regular rate and rhythm  Abdomen:  Soft, nontender  Extremities: Trace peripheral edema.  Neurologic: Awake, alert  Skin: Warm,dry, bilateral lower extremity wound dressings  Access: Lt AVF    Basic Metabolic Panel: Recent Labs  Lab 06/05/24 1658  06/06/24 0319 06/07/24 0559 06/07/24 1648 06/09/24 1237  NA 140 140 135  --  136  K 4.4 4.5 4.3  --  4.3  CL 99 101 97*  --  97*  CO2 27 25 26   --  27  GLUCOSE 79 100* 88  --  125*  BUN 29* 33* 22  --  33*  CREATININE 6.96* 7.81* 5.50*  --  7.33*  CALCIUM  9.3 8.7* 8.5*  --  8.3*  PHOS  --   --   --  1.6* 3.9    Liver Function Tests: Recent Labs  Lab 06/05/24 1658 06/09/24 1237  AST 24  --   ALT 11  --   ALKPHOS 89  --   BILITOT 0.4  --   PROT 8.3*  --   ALBUMIN 3.6 2.9*   No results for input(s): LIPASE, AMYLASE in the last 168 hours. No results for input(s): AMMONIA in the last 168 hours.  CBC: Recent Labs  Lab 06/05/24 2132 06/06/24 0319 06/07/24 1648 06/09/24 1237  WBC 8.7 9.4 6.6 5.9  NEUTROABS 6.0  --  4.0 3.3  HGB 9.8* 9.8* 10.1* 8.4*  HCT 31.2* 30.1* 31.5* 26.3*  MCV 87.9 86.2 85.8 87.1  PLT 329 314 303 260    Cardiac Enzymes: No results for input(s): CKTOTAL, CKMB, CKMBINDEX, TROPONINI in the last 168 hours.  BNP: Invalid input(s): POCBNP  CBG: Recent Labs  Lab 06/06/24 0834 06/08/24 0904 06/09/24 1002 06/10/24 0715  GLUCAP 84 88 92 115*    Microbiology: Results for orders placed or performed during the hospital encounter of 06/05/24  Culture, blood (single) w Reflex to ID Panel     Status: None (Preliminary result)   Collection Time: 06/05/24  4:58 PM   Specimen: BLOOD  Result Value Ref Range Status   Specimen Description BLOOD LEFT ANTECUBITAL  Final   Special Requests   Final    BOTTLES DRAWN AEROBIC AND ANAEROBIC Blood Culture adequate volume   Culture   Final    NO GROWTH 4 DAYS Performed at [facility], [address], [place], [place] [address]    Report Status PENDING  Incomplete  Culture, blood (Routine X 2) w Reflex to ID Panel     Status: None (Preliminary result)   Collection Time: 06/05/24  9:39 PM   Specimen: BLOOD  Result Value Ref Range Status   Specimen Description BLOOD BLOOD RIGHT ARM   Final   Special Requests   Final    BOTTLES DRAWN AEROBIC AND ANAEROBIC Blood Culture adequate volume   Culture   Final    NO GROWTH 4 DAYS Performed at [facility], [address] [place], [place] [address]    Report Status PENDING  Incomplete  Aerobic Culture w Gram Stain (superficial specimen)     Status: None   Collection Time: 06/05/24  9:51 PM   Specimen: Wound  Result Value Ref Range Status   Specimen Description   Final    WOUND Performed at [facility], [address], [place], [place] [address]    Special Requests   Final    NONE Performed at [facility], [address] [place], [place] [address]    Gram Stain   Final    RARE WBC PRESENT, PREDOMINANTLY PMN RARE GRAM NEGATIVE RODS RARE GRAM POSITIVE COCCI Performed at [facility], [address], [place], [place] [address]    Culture   Final    ABUNDANT PSEUDOMONAS AERUGINOSA ABUNDANT ENTEROBACTER CLOACAE MODERATE KLEBSIELLA PNEUMONIAE    Report Status 06/10/2024 FINAL  Final   Organism ID, Bacteria ENTEROBACTER CLOACAE  Final   Organism ID, Bacteria KLEBSIELLA PNEUMONIAE  Final   Organism ID, Bacteria PSEUDOMONAS AERUGINOSA  Final      Susceptibility   Enterobacter cloacae - MIC*    CEFEPIME  <=0.12 SENSITIVE Sensitive     ERTAPENEM <=0.12 SENSITIVE Sensitive     CIPROFLOXACIN <=0.06 SENSITIVE Sensitive     GENTAMICIN <=1 SENSITIVE Sensitive     MEROPENEM <=0.25 SENSITIVE Sensitive     TRIMETH/SULFA <=20 SENSITIVE Sensitive     PIP/TAZO Value in next row Sensitive      <=4 SENSITIVEThis is a modified FDA-approved test that has been validated and its performance characteristics determined by the reporting laboratory.  This laboratory is certified under the Clinical Laboratory Improvement Amendments CLIA as qualified to perform high complexity clinical laboratory testing.    * ABUNDANT ENTEROBACTER CLOACAE   Klebsiella pneumoniae - MIC*    AMPICILLIN Value in next row  Resistant      <=4 SENSITIVEThis is a modified FDA-approved test that has been validated and its performance characteristics determined by the reporting laboratory.  This laboratory is certified under the Clinical Laboratory Improvement Amendments CLIA as qualified to perform high complexity clinical laboratory testing.    CEFAZOLIN  (NON-URINE) Value in next row Sensitive      <=4 SENSITIVEThis is a modified FDA-approved test that has been validated and its performance characteristics determined by the reporting laboratory.  This laboratory is certified under the Clinical Laboratory Improvement Amendments CLIA as qualified to  perform high complexity clinical laboratory testing.    CEFEPIME  Value in next row Sensitive      <=4 SENSITIVEThis is a modified FDA-approved test that has been validated and its performance characteristics determined by the reporting laboratory.  This laboratory is certified under the Clinical Laboratory Improvement Amendments CLIA as qualified to perform high complexity clinical laboratory testing.    ERTAPENEM Value in next row Sensitive      <=4 SENSITIVEThis is a modified FDA-approved test that has been validated and its performance characteristics determined by the reporting laboratory.  This laboratory is certified under the Clinical Laboratory Improvement Amendments CLIA as qualified to perform high complexity clinical laboratory testing.    CEFTRIAXONE  Value in next row Sensitive      <=4 SENSITIVEThis is a modified FDA-approved test that has been validated and its performance characteristics determined by the reporting laboratory.  This laboratory is certified under the Clinical Laboratory Improvement Amendments CLIA as qualified to perform high complexity clinical laboratory testing.    CIPROFLOXACIN Value in next row Sensitive      <=4 SENSITIVEThis is a modified FDA-approved test that has been validated and its performance characteristics determined by the reporting  laboratory.  This laboratory is certified under the Clinical Laboratory Improvement Amendments CLIA as qualified to perform high complexity clinical laboratory testing.    GENTAMICIN Value in next row Sensitive      <=4 SENSITIVEThis is a modified FDA-approved test that has been validated and its performance characteristics determined by the reporting laboratory.  This laboratory is certified under the Clinical Laboratory Improvement Amendments CLIA as qualified to perform high complexity clinical laboratory testing.    MEROPENEM Value in next row Sensitive      <=4 SENSITIVEThis is a modified FDA-approved test that has been validated and its performance characteristics determined by the reporting laboratory.  This laboratory is certified under the Clinical Laboratory Improvement Amendments CLIA as qualified to perform high complexity clinical laboratory testing.    TRIMETH/SULFA Value in next row Sensitive      <=4 SENSITIVEThis is a modified FDA-approved test that has been validated and its performance characteristics determined by the reporting laboratory.  This laboratory is certified under the Clinical Laboratory Improvement Amendments CLIA as qualified to perform high complexity clinical laboratory testing.    AMPICILLIN/SULBACTAM Value in next row Sensitive      <=4 SENSITIVEThis is a modified FDA-approved test that has been validated and its performance characteristics determined by the reporting laboratory.  This laboratory is certified under the Clinical Laboratory Improvement Amendments CLIA as qualified to perform high complexity clinical laboratory testing.    PIP/TAZO Value in next row Sensitive      <=4 SENSITIVEThis is a modified FDA-approved test that has been validated and its performance characteristics determined by the reporting laboratory.  This laboratory is certified under the Clinical Laboratory Improvement Amendments CLIA as qualified to perform high complexity clinical laboratory  testing.    * MODERATE KLEBSIELLA PNEUMONIAE   Pseudomonas aeruginosa - MIC*    MEROPENEM Value in next row Sensitive      <=4 SENSITIVEThis is a modified FDA-approved test that has been validated and its performance characteristics determined by the reporting laboratory.  This laboratory is certified under the Clinical Laboratory Improvement Amendments CLIA as qualified to perform high complexity clinical laboratory testing.    CIPROFLOXACIN Value in next row Sensitive      <=4 SENSITIVEThis is a modified FDA-approved test that has been validated and its performance characteristics  determined by the reporting laboratory.  This laboratory is certified under the Clinical Laboratory Improvement Amendments CLIA as qualified to perform high complexity clinical laboratory testing.    IMIPENEM Value in next row Sensitive      <=4 SENSITIVEThis is a modified FDA-approved test that has been validated and its performance characteristics determined by the reporting laboratory.  This laboratory is certified under the Clinical Laboratory Improvement Amendments CLIA as qualified to perform high complexity clinical laboratory testing.    PIP/TAZO Value in next row Sensitive      16 SENSITIVEThis is a modified FDA-approved test that has been validated and its performance characteristics determined by the reporting laboratory.  This laboratory is certified under the Clinical Laboratory Improvement Amendments CLIA as qualified to perform high complexity clinical laboratory testing.    CEFEPIME  Value in next row Sensitive      16 SENSITIVEThis is a modified FDA-approved test that has been validated and its performance characteristics determined by the reporting laboratory.  This laboratory is certified under the Clinical Laboratory Improvement Amendments CLIA as qualified to perform high complexity clinical laboratory testing.    CEFTAZIDIME/AVIBACTAM Value in next row Sensitive      16 SENSITIVEThis is a modified  FDA-approved test that has been validated and its performance characteristics determined by the reporting laboratory.  This laboratory is certified under the Clinical Laboratory Improvement Amendments CLIA as qualified to perform high complexity clinical laboratory testing.    CEFTOLOZANE/TAZOBACTAM Value in next row Sensitive      16 SENSITIVEThis is a modified FDA-approved test that has been validated and its performance characteristics determined by the reporting laboratory.  This laboratory is certified under the Clinical Laboratory Improvement Amendments CLIA as qualified to perform high complexity clinical laboratory testing.    TOBRAMYCIN Value in next row Sensitive      16 SENSITIVEThis is a modified FDA-approved test that has been validated and its performance characteristics determined by the reporting laboratory.  This laboratory is certified under the Clinical Laboratory Improvement Amendments CLIA as qualified to perform high complexity clinical laboratory testing.    CEFTAZIDIME Value in next row Sensitive      16 SENSITIVEThis is a modified FDA-approved test that has been validated and its performance characteristics determined by the reporting laboratory.  This laboratory is certified under the Clinical Laboratory Improvement Amendments CLIA as qualified to perform high complexity clinical laboratory testing.    * ABUNDANT PSEUDOMONAS AERUGINOSA  Culture, blood (Routine X 2) w Reflex to ID Panel     Status: None   Collection Time: 06/05/24 11:25 PM   Specimen: BLOOD  Result Value Ref Range Status   Specimen Description BLOOD RIGHT ANTECUBITAL  Final   Special Requests   Final    BOTTLES DRAWN AEROBIC AND ANAEROBIC Blood Culture adequate volume   Culture   Final    NO GROWTH 5 DAYS Performed at [facility], [address] [place], [place] [address]    Report Status 06/10/2024 FINAL  Final    Coagulation Studies: No results for input(s): LABPROT, INR in the last  72 hours.   Urinalysis: No results for input(s): COLORURINE, LABSPEC, PHURINE, GLUCOSEU, HGBUR, BILIRUBINUR, KETONESUR, PROTEINUR, UROBILINOGEN, NITRITE, LEUKOCYTESUR in the last 72 hours.  Invalid input(s): APPERANCEUR    Imaging: No results found.    Medications:    piperacillin -tazobactam 2.25 g (06/10/24 0344)    aspirin   81 mg Oral Daily   brimonidine   1 drop Both Eyes BID   And  timolol   1 drop Both Eyes BID   Chlorhexidine  Gluconate Cloth  6 each Topical Q0600   clopidogrel   75 mg Oral Daily   collagenase    Topical Daily   donepezil   10 mg Oral QHS   heparin   5,000 Units Subcutaneous Q8H   latanoprost   1 drop Both Eyes QHS   memantine   5 mg Oral BID   simvastatin   10 mg Oral QHS   acetaminophen , hydrALAZINE , iodixanol , ondansetron  (ZOFRAN ) IV, oxyCODONE -acetaminophen   Assessment/ Plan:  [name] is a 73 y.o.  male with past medical conditions including hypertension, GERD, dementia, CAD, and end-stage renal disease on hemodialysis.  Patient presents to the emergency department for evaluation of foot wound and pain and has been admitted for Foot ulcer (HCC) [L97.509] Right foot infection [L08.9]   End stage renal disease on hemodialysis.  Receiving dialysis today, UF goal 1 to 1.5 L as tolerated.  Next treatment scheduled for Tuesday.   2. Anemia of chronic kidney disease Lab Results  Component Value Date   HGB 8.4 (L) 06/09/2024  Hemoglobin decreased, 8.4..  Will likely require ESA with next dialysis treatment.  3. Secondary Hyperparathyroidism: with outpatient labs:none available  Lab Results  Component Value Date   PTH 101 (H) 07/26/2018   CALCIUM  8.3 (L) 06/09/2024   PHOS 3.9 06/09/2024    Calcium  and phosphorus within optimal range.  4. Hypotension with chronic kidney disease. Prescribed Midodrine outpatient. Blood pressure 115/73 during dialysis.   LOS: 4 [place] 11/29/202511:49 AM

## 2024-06-10 NOTE — Progress Notes (Signed)
   06/10/24 1055  Vitals  Temp 97.8 F (36.6 C)  BP 115/73  MAP (mmHg) 87  Pulse Rate 72  ECG Heart Rate 72  Resp 17  Weight 93.3 kg  Type of Weight Post-Dialysis  Oxygen Therapy  SpO2 100 %  O2 Device Room Air  Patient Activity (if Appropriate) In bed  Pulse Oximetry Type Continuous  Oximetry Probe Site Changed No  During Treatment Monitoring  Blood Flow Rate (mL/min) 200 mL/min  Arterial Pressure (mmHg) -94.95 mmHg  Venous Pressure (mmHg) 100.4 mmHg  TMP (mmHg) 5.25 mmHg  Ultrafiltration Rate (mL/min) 598 mL/min  Dialysate Flow Rate (mL/min) 300 ml/min  Dialysate Potassium Concentration 2  Dialysate Calcium  Concentration 2.5  Duration of HD Treatment -hour(s) 3 hour(s)  Cumulative Fluid Removed (mL) per Treatment  1500.09  Post Treatment  Dialyzer Clearance Clear  Hemodialysis Intake (mL) 0 mL  Liters Processed 72  Fluid Removed (mL) 1500 mL  Tolerated HD Treatment Yes  Post-Hemodialysis Comments Tx has been tolerated. Set goal has been met. Vs have remained stable. No prololnged bleeding. Hemostasis has been achieved. No verbalized concerns. No pain or distress reported

## 2024-06-11 DIAGNOSIS — Z992 Dependence on renal dialysis: Secondary | ICD-10-CM | POA: Diagnosis not present

## 2024-06-11 DIAGNOSIS — L089 Local infection of the skin and subcutaneous tissue, unspecified: Secondary | ICD-10-CM | POA: Diagnosis not present

## 2024-06-11 DIAGNOSIS — N186 End stage renal disease: Secondary | ICD-10-CM | POA: Diagnosis not present

## 2024-06-11 DIAGNOSIS — D631 Anemia in chronic kidney disease: Secondary | ICD-10-CM | POA: Diagnosis not present

## 2024-06-11 LAB — CULTURE, BLOOD (ROUTINE X 2)
Culture: NO GROWTH
Special Requests: ADEQUATE

## 2024-06-11 LAB — CULTURE, BLOOD (SINGLE)
Culture: NO GROWTH
Special Requests: ADEQUATE

## 2024-06-11 LAB — GLUCOSE, CAPILLARY: Glucose-Capillary: 90 mg/dL (ref 70–99)

## 2024-06-11 NOTE — Progress Notes (Signed)
 [facility]  ROUNDING NOTE   Subjective:   Anthony Houston is a 73 year old male with past medical conditions including hypertension, GERD, dementia, CAD, and end-stage renal disease on hemodialysis.  Patient presents to the emergency department for evaluation of foot wound and pain and has been admitted for Foot ulcer (HCC) [L97.509] Right foot infection [L08.9]  Patient is known to our practice and receives outpatient dialysis treatments at [facility] on a TTS schedule, supervised by Dr. Dennise.    Update:  Patient seen sitting up in bed Currently eating breakfast, independently Remains on room air Denies pain or discomfort Denies shortness of breath  Objective:  Vital signs in last 24 hours:  Temp:  [97.8 F (36.6 C)-98.9 F (37.2 C)] 98 F (36.7 C) (11/30 0721) Pulse Rate:  [62-72] 64 (11/30 0721) Resp:  [16-20] 17 (11/30 0721) BP: (99-136)/(61-89) 101/89 (11/30 0721) SpO2:  [98 %-100 %] 100 % (11/30 0721) Weight:  [93.3 kg] 93.3 kg (11/29 1055)  Weight change: -2.8 kg Filed Weights   06/09/24 1516 06/10/24 0734 06/10/24 1055  Weight: 96.6 kg 94.6 kg 93.3 kg    Intake/Output: I/O last 3 completed shifts: In: 240 [P.O.:240] Out: 1500 [Other:1500]   Intake/Output this shift:  No intake/output data recorded.  Physical Exam: General: NAD  Head: Normocephalic, atraumatic. Moist oral mucosal membranes  Eyes: Anicteric  Lungs:  Clear to auscultation, normal effort  Heart: Regular rate and rhythm  Abdomen:  Soft, nontender  Extremities: Trace peripheral edema.  Neurologic: Awake, alert  Skin: Warm,dry, bilateral lower extremity wound dressings  Access: Lt AVF    Basic Metabolic Panel: Recent Labs  Lab 06/05/24 1658 06/06/24 0319 06/07/24 0559 06/07/24 1648 06/09/24 1237  NA 140 140 135  --  136  K 4.4 4.5 4.3  --  4.3  CL 99 101 97*  --  97*  CO2 27 25 26   --  27  GLUCOSE 79 100* 88  --  125*  BUN 29* 33* 22  --  33*   CREATININE 6.96* 7.81* 5.50*  --  7.33*  CALCIUM  9.3 8.7* 8.5*  --  8.3*  PHOS  --   --   --  1.6* 3.9    Liver Function Tests: Recent Labs  Lab 06/05/24 1658 06/09/24 1237  AST 24  --   ALT 11  --   ALKPHOS 89  --   BILITOT 0.4  --   PROT 8.3*  --   ALBUMIN 3.6 2.9*   No results for input(s): LIPASE, AMYLASE in the last 168 hours. No results for input(s): AMMONIA in the last 168 hours.  CBC: Recent Labs  Lab 06/05/24 2132 06/06/24 0319 06/07/24 1648 06/09/24 1237  WBC 8.7 9.4 6.6 5.9  NEUTROABS 6.0  --  4.0 3.3  HGB 9.8* 9.8* 10.1* 8.4*  HCT 31.2* 30.1* 31.5* 26.3*  MCV 87.9 86.2 85.8 87.1  PLT 329 314 303 260    Cardiac Enzymes: No results for input(s): CKTOTAL, CKMB, CKMBINDEX, TROPONINI in the last 168 hours.  BNP: Invalid input(s): POCBNP  CBG: Recent Labs  Lab 06/06/24 0834 06/08/24 0904 06/09/24 1002 06/10/24 0715 06/11/24 0722  GLUCAP 84 88 92 115* 90    Microbiology: Results for orders placed or performed during the hospital encounter of 06/05/24  Culture, blood (single) w Reflex to ID Panel     Status: None   Collection Time: 06/05/24  4:58 PM   Specimen: BLOOD  Result Value Ref Range Status  Specimen Description BLOOD LEFT ANTECUBITAL  Final   Special Requests   Final    BOTTLES DRAWN AEROBIC AND ANAEROBIC Blood Culture adequate volume   Culture   Final    NO GROWTH 5 DAYS Performed at [facility], [address] [place], [place] [address]    Report Status 06/11/2024 FINAL  Final  Culture, blood (Routine X 2) w Reflex to ID Panel     Status: None   Collection Time: 06/05/24  9:39 PM   Specimen: BLOOD  Result Value Ref Range Status   Specimen Description BLOOD BLOOD RIGHT ARM  Final   Special Requests   Final    BOTTLES DRAWN AEROBIC AND ANAEROBIC Blood Culture adequate volume   Culture   Final    NO GROWTH 5 DAYS Performed at [facility], [address] [place], [place] [address]     Report Status 06/11/2024 FINAL  Final  Aerobic Culture w Gram Stain (superficial specimen)     Status: None   Collection Time: 06/05/24  9:51 PM   Specimen: Wound  Result Value Ref Range Status   Specimen Description   Final    WOUND Performed at [facility], [address], [place], [place] [address]    Special Requests   Final    NONE Performed at [facility], [address] [place], [place] [address]    Gram Stain   Final    RARE WBC PRESENT, PREDOMINANTLY PMN RARE GRAM NEGATIVE RODS RARE GRAM POSITIVE COCCI Performed at [facility], [address] [place], [place] [address]    Culture   Final    ABUNDANT PSEUDOMONAS AERUGINOSA ABUNDANT ENTEROBACTER CLOACAE MODERATE KLEBSIELLA PNEUMONIAE    Report Status 06/10/2024 FINAL  Final   Organism ID, Bacteria ENTEROBACTER CLOACAE  Final   Organism ID, Bacteria KLEBSIELLA PNEUMONIAE  Final   Organism ID, Bacteria PSEUDOMONAS AERUGINOSA  Final      Susceptibility   Enterobacter cloacae - MIC*    CEFEPIME  <=0.12 SENSITIVE Sensitive     ERTAPENEM <=0.12 SENSITIVE Sensitive     CIPROFLOXACIN <=0.06 SENSITIVE Sensitive     GENTAMICIN <=1 SENSITIVE Sensitive     MEROPENEM <=0.25 SENSITIVE Sensitive     TRIMETH/SULFA <=20 SENSITIVE Sensitive     PIP/TAZO Value in next row Sensitive      <=4 SENSITIVEThis is a modified FDA-approved test that has been validated and its performance characteristics determined by the reporting laboratory.  This laboratory is certified under the Clinical Laboratory Improvement Amendments CLIA as qualified to perform high complexity clinical laboratory testing.    * ABUNDANT ENTEROBACTER CLOACAE   Klebsiella pneumoniae - MIC*    AMPICILLIN Value in next row Resistant      <=4 SENSITIVEThis is a modified FDA-approved test that has been validated and its performance characteristics determined by the reporting laboratory.  This laboratory is certified under the Clinical Laboratory  Improvement Amendments CLIA as qualified to perform high complexity clinical laboratory testing.    CEFAZOLIN  (NON-URINE) Value in next row Sensitive      <=4 SENSITIVEThis is a modified FDA-approved test that has been validated and its performance characteristics determined by the reporting laboratory.  This laboratory is certified under the Clinical Laboratory Improvement Amendments CLIA as qualified to perform high complexity clinical laboratory testing.    CEFEPIME  Value in next row Sensitive      <=4 SENSITIVEThis is a modified FDA-approved test that has been validated and its performance characteristics determined  by the reporting laboratory.  This laboratory is certified under the Clinical Laboratory Improvement Amendments CLIA as qualified to perform high complexity clinical laboratory testing.    ERTAPENEM Value in next row Sensitive      <=4 SENSITIVEThis is a modified FDA-approved test that has been validated and its performance characteristics determined by the reporting laboratory.  This laboratory is certified under the Clinical Laboratory Improvement Amendments CLIA as qualified to perform high complexity clinical laboratory testing.    CEFTRIAXONE  Value in next row Sensitive      <=4 SENSITIVEThis is a modified FDA-approved test that has been validated and its performance characteristics determined by the reporting laboratory.  This laboratory is certified under the Clinical Laboratory Improvement Amendments CLIA as qualified to perform high complexity clinical laboratory testing.    CIPROFLOXACIN Value in next row Sensitive      <=4 SENSITIVEThis is a modified FDA-approved test that has been validated and its performance characteristics determined by the reporting laboratory.  This laboratory is certified under the Clinical Laboratory Improvement Amendments CLIA as qualified to perform high complexity clinical laboratory testing.    GENTAMICIN Value in next row Sensitive      <=4  SENSITIVEThis is a modified FDA-approved test that has been validated and its performance characteristics determined by the reporting laboratory.  This laboratory is certified under the Clinical Laboratory Improvement Amendments CLIA as qualified to perform high complexity clinical laboratory testing.    MEROPENEM Value in next row Sensitive      <=4 SENSITIVEThis is a modified FDA-approved test that has been validated and its performance characteristics determined by the reporting laboratory.  This laboratory is certified under the Clinical Laboratory Improvement Amendments CLIA as qualified to perform high complexity clinical laboratory testing.    TRIMETH/SULFA Value in next row Sensitive      <=4 SENSITIVEThis is a modified FDA-approved test that has been validated and its performance characteristics determined by the reporting laboratory.  This laboratory is certified under the Clinical Laboratory Improvement Amendments CLIA as qualified to perform high complexity clinical laboratory testing.    AMPICILLIN/SULBACTAM Value in next row Sensitive      <=4 SENSITIVEThis is a modified FDA-approved test that has been validated and its performance characteristics determined by the reporting laboratory.  This laboratory is certified under the Clinical Laboratory Improvement Amendments CLIA as qualified to perform high complexity clinical laboratory testing.    PIP/TAZO Value in next row Sensitive      <=4 SENSITIVEThis is a modified FDA-approved test that has been validated and its performance characteristics determined by the reporting laboratory.  This laboratory is certified under the Clinical Laboratory Improvement Amendments CLIA as qualified to perform high complexity clinical laboratory testing.    * MODERATE KLEBSIELLA PNEUMONIAE   Pseudomonas aeruginosa - MIC*    MEROPENEM Value in next row Sensitive      <=4 SENSITIVEThis is a modified FDA-approved test that has been validated and its performance  characteristics determined by the reporting laboratory.  This laboratory is certified under the Clinical Laboratory Improvement Amendments CLIA as qualified to perform high complexity clinical laboratory testing.    CIPROFLOXACIN Value in next row Sensitive      <=4 SENSITIVEThis is a modified FDA-approved test that has been validated and its performance characteristics determined by the reporting laboratory.  This laboratory is certified under the Clinical Laboratory Improvement Amendments CLIA as qualified to perform high complexity clinical laboratory testing.    IMIPENEM Value in next row Sensitive      <=  4 SENSITIVEThis is a modified FDA-approved test that has been validated and its performance characteristics determined by the reporting laboratory.  This laboratory is certified under the Clinical Laboratory Improvement Amendments CLIA as qualified to perform high complexity clinical laboratory testing.    PIP/TAZO Value in next row Sensitive      16 SENSITIVEThis is a modified FDA-approved test that has been validated and its performance characteristics determined by the reporting laboratory.  This laboratory is certified under the Clinical Laboratory Improvement Amendments CLIA as qualified to perform high complexity clinical laboratory testing.    CEFEPIME  Value in next row Sensitive      16 SENSITIVEThis is a modified FDA-approved test that has been validated and its performance characteristics determined by the reporting laboratory.  This laboratory is certified under the Clinical Laboratory Improvement Amendments CLIA as qualified to perform high complexity clinical laboratory testing.    CEFTAZIDIME/AVIBACTAM Value in next row Sensitive      16 SENSITIVEThis is a modified FDA-approved test that has been validated and its performance characteristics determined by the reporting laboratory.  This laboratory is certified under the Clinical Laboratory Improvement Amendments CLIA as qualified to  perform high complexity clinical laboratory testing.    CEFTOLOZANE/TAZOBACTAM Value in next row Sensitive      16 SENSITIVEThis is a modified FDA-approved test that has been validated and its performance characteristics determined by the reporting laboratory.  This laboratory is certified under the Clinical Laboratory Improvement Amendments CLIA as qualified to perform high complexity clinical laboratory testing.    TOBRAMYCIN Value in next row Sensitive      16 SENSITIVEThis is a modified FDA-approved test that has been validated and its performance characteristics determined by the reporting laboratory.  This laboratory is certified under the Clinical Laboratory Improvement Amendments CLIA as qualified to perform high complexity clinical laboratory testing.    CEFTAZIDIME Value in next row Sensitive      16 SENSITIVEThis is a modified FDA-approved test that has been validated and its performance characteristics determined by the reporting laboratory.  This laboratory is certified under the Clinical Laboratory Improvement Amendments CLIA as qualified to perform high complexity clinical laboratory testing.    * ABUNDANT PSEUDOMONAS AERUGINOSA  Culture, blood (Routine X 2) w Reflex to ID Panel     Status: None   Collection Time: 06/05/24 11:25 PM   Specimen: BLOOD  Result Value Ref Range Status   Specimen Description BLOOD RIGHT ANTECUBITAL  Final   Special Requests   Final    BOTTLES DRAWN AEROBIC AND ANAEROBIC Blood Culture adequate volume   Culture   Final    NO GROWTH 5 DAYS Performed at [facility], [address], [place], [place] [address]    Report Status 06/10/2024 FINAL  Final    Coagulation Studies: No results for input(s): LABPROT, INR in the last 72 hours.   Urinalysis: No results for input(s): COLORURINE, LABSPEC, PHURINE, GLUCOSEU, HGBUR, BILIRUBINUR, KETONESUR, PROTEINUR, UROBILINOGEN, NITRITE, LEUKOCYTESUR in the last 72  hours.  Invalid input(s): APPERANCEUR    Imaging: No results found.    Medications:    piperacillin -tazobactam 2.25 g (06/11/24 0332)    aspirin   81 mg Oral Daily   brimonidine   1 drop Both Eyes BID   And   timolol   1 drop Both Eyes BID   Chlorhexidine  Gluconate Cloth  6 each Topical Q0600   clopidogrel   75 mg Oral Daily   collagenase    Topical Daily   donepezil   10 mg Oral  QHS   heparin   5,000 Units Subcutaneous Q8H   latanoprost   1 drop Both Eyes QHS   memantine   5 mg Oral BID   simvastatin   10 mg Oral QHS   acetaminophen , hydrALAZINE , iodixanol , ondansetron  (ZOFRAN ) IV, oxyCODONE -acetaminophen   Assessment/ Plan:  [name] is a 73 y.o.  male with past medical conditions including hypertension, GERD, dementia, CAD, and end-stage renal disease on hemodialysis.  Patient presents to the emergency department for evaluation of foot wound and pain and has been admitted for Foot ulcer (HCC) [L97.509] Right foot infection [L08.9]   End stage renal disease on hemodialysis.  Patient received dialysis yesterday with UF 1.5 L achieved.  Next treatment scheduled for Tuesday.   2. Anemia of chronic kidney disease Lab Results  Component Value Date   HGB 8.4 (L) 06/09/2024  Will continue to monitor hemoglobin and assess need for ESA.   3. Secondary Hyperparathyroidism: with outpatient labs:none available  Lab Results  Component Value Date   PTH 101 (H) 07/26/2018   CALCIUM  8.3 (L) 06/09/2024   PHOS 3.9 06/09/2024    Will continue to monitor bone minerals during this admission  4. Hypotension with chronic kidney disease. Prescribed Midodrine outpatient. Blood pressure soft this morning, will continue to monitor for need to restart Midodrine   LOS: 5 Aneliese Beaudry 11/30/20259:06 AM

## 2024-06-11 NOTE — Plan of Care (Signed)
  Problem: Education: Goal: Knowledge of General Education information will improve Description: Including pain rating scale, medication(s)/side effects and non-pharmacologic comfort measures Outcome: Progressing   Problem: Health Behavior/Discharge Planning: Goal: Ability to manage health-related needs will improve Outcome: Progressing   Problem: Clinical Measurements: Goal: Will remain free from infection Outcome: Progressing Goal: Cardiovascular complication will be avoided Outcome: Progressing   Problem: Nutrition: Goal: Adequate nutrition will be maintained Outcome: Progressing   Problem: Pain Managment: Goal: General experience of comfort will improve and/or be controlled Outcome: Progressing

## 2024-06-11 NOTE — Care Management Important Message (Signed)
 Important Message  Patient Details  Name: Anthony Houston MRN: [id number] Date of Birth: Dec 27, 1950   Important Message Given:  Yes - Medicare IM     Sharry Beining W, CMA 06/11/2024, 1:54 PM

## 2024-06-11 NOTE — Progress Notes (Signed)
  Progress Note   Patient: Anthony Houston FMW:[id number] DOB: [birth year] DOA: 06/05/2024     5 DOS: the patient was seen and examined on 06/11/2024   Brief hospital course: Anthony Houston is a 73 y.o. male with medical history significant of HTN, CAD, GERD, dementia, ESRD-HD (MWF?), GIST, HBV, who presents with foot wound and foot pain.  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  Patient had angioplasty and drug-eluting stent placed on 11/16.  Continued on antibiotics.   Principal Problem:   Right foot infection with wound Active Problems:   ESRD on hemodialysis (HCC)   Hypertension   CAD (coronary artery disease)   Anemia in ESRD (end-stage renal disease) (HCC)   Diarrhea   Dementia (HCC)   Obesity (BMI 30-39.9)   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)   Hypophosphatemia   Assessment and Plan: Acute osteomyelitis of right foot Peripheral arterial disease. 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  Continue empiric antibiotics with vancomycin  and Zosyn  Discussed with vascular surgery, patient had angioplasty and drug-eluting stent placed. Wound culture grew Pseudomonas, Klebsiella as well as Enterobacter. Vascular surgery is considering BKA.  Vancomycin  discontinued.  Patient is pending surgery for BKA.  Continue antibiotics with Zosyn .   ESRD on hemodialysis (MWF?) Hypophosphatemia. Anemia of end-stage renal disease. Continue dialysis per nephrology. Received sodium phosphate  11/27.  Hemoglobin stable. Recheck lab tomorrow.   Hypertension Continue Coreg  and as needed hydralazine    CAD (coronary artery disease) Continue Zocor  and aspirin    Diarrhea Condition seem to be improved, not able to collect stool   Dementia [facility]): No behavior disturbance -Donepezil  and Namenda    Obesity (BMI 30-39.9): Patient has Obesity  Class II, with body weight 130.6 Kg and BMI 38  kg/m2.  Diet and excise.       Subjective:  Patient doing well, good appetite.  No longer has any diarrhea.  Physical Exam: Vitals:   06/10/24 1622 06/10/24 2013 06/11/24 0356 06/11/24 0721  BP: 134/69 136/78 125/61 101/89  Pulse: 67 71 68 64  Resp: 16 17 17 17   Temp: 98.9 F (37.2 C) 98.3 F (36.8 C) 98.2 F (36.8 C) 98 F (36.7 C)  TempSrc:    Oral  SpO2: 99% 100% 99% 100%  Weight:      Height:       General exam: Appears calm and comfortable  Respiratory system: Clear to auscultation. Respiratory effort normal. Cardiovascular system: S1 & S2 heard, RRR. No JVD, murmurs, rubs, gallops or clicks. No pedal edema. Gastrointestinal system: Abdomen is nondistended, soft and nontender. No organomegaly or masses felt. Normal bowel sounds heard. Central nervous system: Alert and oriented. No focal neurological deficits. Extremities: Symmetric 5 x 5 power. Skin: No rashes, lesions or ulcers Psychiatry: Judgement and insight appear normal. Mood & affect appropriate.    Data Reviewed:  There are no new results to review at this time.  Family Communication: None  Disposition: Status is: Inpatient Remains inpatient appropriate because: Severity of disease, pending inpatient procedure, antibiotics.     Time spent: 35 minutes  Author: Murvin Mana, MD 06/11/2024 12:11 PM  For on call review [email or web address].

## 2024-06-12 DIAGNOSIS — N186 End stage renal disease: Secondary | ICD-10-CM | POA: Diagnosis not present

## 2024-06-12 DIAGNOSIS — Z992 Dependence on renal dialysis: Secondary | ICD-10-CM | POA: Diagnosis not present

## 2024-06-12 DIAGNOSIS — D631 Anemia in chronic kidney disease: Secondary | ICD-10-CM | POA: Diagnosis not present

## 2024-06-12 DIAGNOSIS — L089 Local infection of the skin and subcutaneous tissue, unspecified: Secondary | ICD-10-CM | POA: Diagnosis not present

## 2024-06-12 LAB — CBC WITH DIFFERENTIAL/PLATELET
Abs Immature Granulocytes: 0.03 K/uL (ref 0.00–0.07)
Basophils Absolute: 0 K/uL (ref 0.0–0.1)
Basophils Relative: 1 %
Eosinophils Absolute: 0.5 K/uL (ref 0.0–0.5)
Eosinophils Relative: 7 %
HCT: 28.4 % — ABNORMAL LOW (ref 39.0–52.0)
Hemoglobin: 9.3 g/dL — ABNORMAL LOW (ref 13.0–17.0)
Immature Granulocytes: 1 %
Lymphocytes Relative: 25 %
Lymphs Abs: 1.6 K/uL (ref 0.7–4.0)
MCH: 27.8 pg (ref 26.0–34.0)
MCHC: 32.7 g/dL (ref 30.0–36.0)
MCV: 85 fL (ref 80.0–100.0)
Monocytes Absolute: 0.7 K/uL (ref 0.1–1.0)
Monocytes Relative: 11 %
Neutro Abs: 3.5 K/uL (ref 1.7–7.7)
Neutrophils Relative %: 55 %
Platelets: 273 K/uL (ref 150–400)
RBC: 3.34 MIL/uL — ABNORMAL LOW (ref 4.22–5.81)
RDW: 15.2 % (ref 11.5–15.5)
WBC: 6.3 K/uL (ref 4.0–10.5)
nRBC: 0 % (ref 0.0–0.2)

## 2024-06-12 MED ORDER — SENNOSIDES-DOCUSATE SODIUM 8.6-50 MG PO TABS
2.0000 | ORAL_TABLET | Freq: Two times a day (BID) | ORAL | Status: DC
  Administered 2024-06-12 – 2024-06-19 (×12): 2 via ORAL
  Filled 2024-06-12 (×12): qty 2

## 2024-06-12 MED ORDER — ENTECAVIR 0.5 MG PO TABS
0.5000 mg | ORAL_TABLET | ORAL | Status: DC
  Administered 2024-06-12 – 2024-06-18 (×2): 0.5 mg via ORAL
  Filled 2024-06-12 (×3): qty 1

## 2024-06-12 MED ORDER — EPOETIN ALFA-EPBX 4000 UNIT/ML IJ SOLN
4000.0000 [IU] | INTRAMUSCULAR | Status: DC
  Administered 2024-06-13 – 2024-06-17 (×3): 4000 [IU] via INTRAVENOUS
  Filled 2024-06-12 (×2): qty 1

## 2024-06-12 MED ORDER — POLYETHYLENE GLYCOL 3350 17 G PO PACK
17.0000 g | PACK | Freq: Every day | ORAL | Status: DC | PRN
  Administered 2024-06-12: 17 g via ORAL
  Filled 2024-06-12: qty 1

## 2024-06-12 NOTE — Progress Notes (Signed)
  Progress Note   Patient: Anthony Houston FMW:[id number] DOB: [birth year] DOA: 06/05/2024     6 DOS: the patient was seen and examined on 06/12/2024   Brief hospital course: EDGE MAUGER is a 73 y.o. male with medical history significant of HTN, CAD, GERD, dementia, ESRD-HD (MWF?), GIST, HBV, who presents with foot wound and foot pain.  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  Patient had angioplasty and drug-eluting stent placed on 11/16.  Continued on antibiotics.   Principal Problem:   Right foot infection with wound Active Problems:   ESRD on hemodialysis (HCC)   Hypertension   CAD (coronary artery disease)   Anemia in ESRD (end-stage renal disease) (HCC)   Diarrhea   Dementia (HCC)   Obesity (BMI 30-39.9)   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)   Hypophosphatemia   Assessment and Plan:  Acute osteomyelitis of right foot Peripheral arterial disease. 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  Continue empiric antibiotics with vancomycin  and Zosyn  Discussed with vascular surgery, patient had angioplasty and drug-eluting stent placed. Wound culture grew Pseudomonas, Klebsiella as well as Enterobacter. Vascular surgery is considering BKA.  Vancomycin  discontinued.  Patient is pending surgery for BKA.  Continue antibiotics with Zosyn . Condition stable.  Discussed with vascular surgery, has not get consent from brother for BKA.   ESRD on hemodialysis (MWF?) Hypophosphatemia. Anemia of end-stage renal disease. Continue dialysis per nephrology. Received sodium phosphate  11/27.  Hemoglobin increased to 9.3.  Stable.    Hypertension Continue Coreg  and as needed hydralazine    CAD (coronary artery disease) Continue Zocor  and aspirin    Diarrhea Condition seem to be improved, not able to collect stool   Dementia [facility]): No  behavior disturbance -Donepezil  and Namenda    Obesity (BMI 30-39.9): Patient has Obesity Class II, with body weight 130.6 Kg and BMI 38  kg/m2.  Diet and excise.        Subjective:  Patient doing well, currently has no complaints.  Physical Exam: Vitals:   06/11/24 1601 06/11/24 2019 06/12/24 0501 06/12/24 0842  BP: 118/72 138/83 131/68 115/70  Pulse: 69 69 63 65  Resp: 17 18 18 16   Temp: 98 F (36.7 C) 98 F (36.7 C) 98 F (36.7 C) 98.1 F (36.7 C)  TempSrc: Oral Oral Oral   SpO2: 100% 100% 98% 98%  Weight:      Height:       General exam: Appears calm and comfortable  Respiratory system: Clear to auscultation. Respiratory effort normal. Cardiovascular system: S1 & S2 heard, RRR. No JVD, murmurs, rubs, gallops or clicks. No pedal edema. Gastrointestinal system: Abdomen is nondistended, soft and nontender. No organomegaly or masses felt. Normal bowel sounds heard. Central nervous system: Alert and oriented x2.  No focal neurological deficits. Extremities: Symmetric 5 x 5 power. Skin: No rashes, lesions or ulcers Psychiatry: Mood & affect appropriate.    Data Reviewed:  Lab results reviewed.  Family Communication: None  Disposition: Status is: Inpatient Remains inpatient appropriate because: Severity of disease, IV treatment, inpatient procedure.     Time spent: 35 minutes  Author: Murvin Mana, MD 06/12/2024 11:53 AM  For on call review [email or web address]

## 2024-06-12 NOTE — Plan of Care (Signed)
  Problem: Skin Integrity: Goal: Skin integrity will improve Outcome: Progressing   Problem: Elimination: Goal: Will not experience complications related to bowel motility Outcome: Progressing   Problem: Nutrition: Goal: Adequate nutrition will be maintained Outcome: Progressing

## 2024-06-12 NOTE — Plan of Care (Signed)
  Problem: Clinical Measurements: Goal: Respiratory complications will improve Outcome: Progressing Goal: Cardiovascular complication will be avoided Outcome: Progressing   Problem: Safety: Goal: Ability to remain free from injury will improve Outcome: Progressing   Problem: Skin Integrity: Goal: Risk for impaired skin integrity will decrease Outcome: Progressing   

## 2024-06-12 NOTE — Progress Notes (Signed)
 [facility]  ROUNDING NOTE   Subjective:   Anthony Houston is a 73 year old male with past medical conditions including hypertension, GERD, dementia, CAD, and end-stage renal disease on hemodialysis.  Patient presents to the emergency department for evaluation of foot wound and pain and has been admitted for Foot ulcer (HCC) [L97.509] Right foot infection [L08.9]  Patient is known to our practice and receives outpatient dialysis treatments at [facility] on a TTS schedule, supervised by Dr. Dennise.    Update:  Patient sitting up in bed Alert Denies pain or discomfort Room air Denies shortness of breath  Objective:  Vital signs in last 24 hours:  Temp:  [98 F (36.7 C)-98.1 F (36.7 C)] 98.1 F (36.7 C) (12/01 0842) Pulse Rate:  [63-69] 65 (12/01 0842) Resp:  [16-18] 16 (12/01 0842) BP: (115-138)/(68-83) 115/70 (12/01 0842) SpO2:  [98 %-100 %] 98 % (12/01 0842)  Weight change:  Filed Weights   06/09/24 1516 06/10/24 0734 06/10/24 1055  Weight: 96.6 kg 94.6 kg 93.3 kg    Intake/Output: I/O last 3 completed shifts: In: 600 [P.O.:600] Out: -    Intake/Output this shift:  No intake/output data recorded.  Physical Exam: General: NAD  Head: Normocephalic, atraumatic. Moist oral mucosal membranes  Eyes: Anicteric  Lungs:  Clear to auscultation, normal effort  Heart: Regular rate and rhythm  Abdomen:  Soft, nontender  Extremities: Trace peripheral edema.  Neurologic: Awake, alert  Skin: Warm,dry, Right lower extremity wound dressings  Access: Lt AVF    Basic Metabolic Panel: Recent Labs  Lab 06/05/24 1658 06/06/24 0319 06/07/24 0559 06/07/24 1648 06/09/24 1237  NA 140 140 135  --  136  K 4.4 4.5 4.3  --  4.3  CL 99 101 97*  --  97*  CO2 27 25 26   --  27  GLUCOSE 79 100* 88  --  125*  BUN 29* 33* 22  --  33*  CREATININE 6.96* 7.81* 5.50*  --  7.33*  CALCIUM  9.3 8.7* 8.5*  --  8.3*  PHOS  --   --   --  1.6* 3.9    Liver  Function Tests: Recent Labs  Lab 06/05/24 1658 06/09/24 1237  AST 24  --   ALT 11  --   ALKPHOS 89  --   BILITOT 0.4  --   PROT 8.3*  --   ALBUMIN 3.6 2.9*   No results for input(s): LIPASE, AMYLASE in the last 168 hours. No results for input(s): AMMONIA in the last 168 hours.  CBC: Recent Labs  Lab 06/05/24 2132 06/06/24 0319 06/07/24 1648 06/09/24 1237 06/12/24 0800  WBC 8.7 9.4 6.6 5.9 6.3  NEUTROABS 6.0  --  4.0 3.3 3.5  HGB 9.8* 9.8* 10.1* 8.4* 9.3*  HCT 31.2* 30.1* 31.5* 26.3* 28.4*  MCV 87.9 86.2 85.8 87.1 85.0  PLT 329 314 303 260 273    Cardiac Enzymes: No results for input(s): CKTOTAL, CKMB, CKMBINDEX, TROPONINI in the last 168 hours.  BNP: Invalid input(s): POCBNP  CBG: Recent Labs  Lab 06/06/24 0834 06/08/24 0904 06/09/24 1002 06/10/24 0715 06/11/24 0722  GLUCAP 84 88 92 115* 90    Microbiology: Results for orders placed or performed during the hospital encounter of 06/05/24  Culture, blood (single) w Reflex to ID Panel     Status: None   Collection Time: 06/05/24  4:58 PM   Specimen: BLOOD  Result Value Ref Range Status   Specimen Description BLOOD LEFT ANTECUBITAL  Final   Special Requests   Final    BOTTLES DRAWN AEROBIC AND ANAEROBIC Blood Culture adequate volume   Culture   Final    NO GROWTH 5 DAYS Performed at [facility], [address] [place], [place] [address]    Report Status 06/11/2024 FINAL  Final  Culture, blood (Routine X 2) w Reflex to ID Panel     Status: None   Collection Time: 06/05/24  9:39 PM   Specimen: BLOOD  Result Value Ref Range Status   Specimen Description BLOOD BLOOD RIGHT ARM  Final   Special Requests   Final    BOTTLES DRAWN AEROBIC AND ANAEROBIC Blood Culture adequate volume   Culture   Final    NO GROWTH 5 DAYS Performed at [facility], [address] [place], [place] [address]    Report Status 06/11/2024 FINAL  Final  Aerobic Culture w Gram Stain  (superficial specimen)     Status: None   Collection Time: 06/05/24  9:51 PM   Specimen: Wound  Result Value Ref Range Status   Specimen Description   Final    WOUND Performed at [facility], [address], [place], [place] [address]    Special Requests   Final    NONE Performed at [facility], [address] [place], [place] [address]    Gram Stain   Final    RARE WBC PRESENT, PREDOMINANTLY PMN RARE GRAM NEGATIVE RODS RARE GRAM POSITIVE COCCI Performed at [facility], [address], [place], [place] [address]    Culture   Final    ABUNDANT PSEUDOMONAS AERUGINOSA ABUNDANT ENTEROBACTER CLOACAE MODERATE KLEBSIELLA PNEUMONIAE    Report Status 06/10/2024 FINAL  Final   Organism ID, Bacteria ENTEROBACTER CLOACAE  Final   Organism ID, Bacteria KLEBSIELLA PNEUMONIAE  Final   Organism ID, Bacteria PSEUDOMONAS AERUGINOSA  Final      Susceptibility   Enterobacter cloacae - MIC*    CEFEPIME  <=0.12 SENSITIVE Sensitive     ERTAPENEM <=0.12 SENSITIVE Sensitive     CIPROFLOXACIN <=0.06 SENSITIVE Sensitive     GENTAMICIN <=1 SENSITIVE Sensitive     MEROPENEM <=0.25 SENSITIVE Sensitive     TRIMETH/SULFA <=20 SENSITIVE Sensitive     PIP/TAZO Value in next row Sensitive      <=4 SENSITIVEThis is a modified FDA-approved test that has been validated and its performance characteristics determined by the reporting laboratory.  This laboratory is certified under the Clinical Laboratory Improvement Amendments CLIA as qualified to perform high complexity clinical laboratory testing.    * ABUNDANT ENTEROBACTER CLOACAE   Klebsiella pneumoniae - MIC*    AMPICILLIN Value in next row Resistant      <=4 SENSITIVEThis is a modified FDA-approved test that has been validated and its performance characteristics determined by the reporting laboratory.  This laboratory is certified under the Clinical Laboratory Improvement Amendments CLIA as qualified to perform high complexity  clinical laboratory testing.    CEFAZOLIN  (NON-URINE) Value in next row Sensitive      <=4 SENSITIVEThis is a modified FDA-approved test that has been validated and its performance characteristics determined by the reporting laboratory.  This laboratory is certified under the Clinical Laboratory Improvement Amendments CLIA as qualified to perform high complexity clinical laboratory testing.    CEFEPIME  Value in next row Sensitive      <=4 SENSITIVEThis is a modified FDA-approved test that has been validated and its performance characteristics determined by the reporting laboratory.  This  laboratory is certified under the Clinical Laboratory Improvement Amendments CLIA as qualified to perform high complexity clinical laboratory testing.    ERTAPENEM Value in next row Sensitive      <=4 SENSITIVEThis is a modified FDA-approved test that has been validated and its performance characteristics determined by the reporting laboratory.  This laboratory is certified under the Clinical Laboratory Improvement Amendments CLIA as qualified to perform high complexity clinical laboratory testing.    CEFTRIAXONE  Value in next row Sensitive      <=4 SENSITIVEThis is a modified FDA-approved test that has been validated and its performance characteristics determined by the reporting laboratory.  This laboratory is certified under the Clinical Laboratory Improvement Amendments CLIA as qualified to perform high complexity clinical laboratory testing.    CIPROFLOXACIN Value in next row Sensitive      <=4 SENSITIVEThis is a modified FDA-approved test that has been validated and its performance characteristics determined by the reporting laboratory.  This laboratory is certified under the Clinical Laboratory Improvement Amendments CLIA as qualified to perform high complexity clinical laboratory testing.    GENTAMICIN Value in next row Sensitive      <=4 SENSITIVEThis is a modified FDA-approved test that has been validated and  its performance characteristics determined by the reporting laboratory.  This laboratory is certified under the Clinical Laboratory Improvement Amendments CLIA as qualified to perform high complexity clinical laboratory testing.    MEROPENEM Value in next row Sensitive      <=4 SENSITIVEThis is a modified FDA-approved test that has been validated and its performance characteristics determined by the reporting laboratory.  This laboratory is certified under the Clinical Laboratory Improvement Amendments CLIA as qualified to perform high complexity clinical laboratory testing.    TRIMETH/SULFA Value in next row Sensitive      <=4 SENSITIVEThis is a modified FDA-approved test that has been validated and its performance characteristics determined by the reporting laboratory.  This laboratory is certified under the Clinical Laboratory Improvement Amendments CLIA as qualified to perform high complexity clinical laboratory testing.    AMPICILLIN/SULBACTAM Value in next row Sensitive      <=4 SENSITIVEThis is a modified FDA-approved test that has been validated and its performance characteristics determined by the reporting laboratory.  This laboratory is certified under the Clinical Laboratory Improvement Amendments CLIA as qualified to perform high complexity clinical laboratory testing.    PIP/TAZO Value in next row Sensitive      <=4 SENSITIVEThis is a modified FDA-approved test that has been validated and its performance characteristics determined by the reporting laboratory.  This laboratory is certified under the Clinical Laboratory Improvement Amendments CLIA as qualified to perform high complexity clinical laboratory testing.    * MODERATE KLEBSIELLA PNEUMONIAE   Pseudomonas aeruginosa - MIC*    MEROPENEM Value in next row Sensitive      <=4 SENSITIVEThis is a modified FDA-approved test that has been validated and its performance characteristics determined by the reporting laboratory.  This laboratory  is certified under the Clinical Laboratory Improvement Amendments CLIA as qualified to perform high complexity clinical laboratory testing.    CIPROFLOXACIN Value in next row Sensitive      <=4 SENSITIVEThis is a modified FDA-approved test that has been validated and its performance characteristics determined by the reporting laboratory.  This laboratory is certified under the Clinical Laboratory Improvement Amendments CLIA as qualified to perform high complexity clinical laboratory testing.    IMIPENEM Value in next row Sensitive      <=4 SENSITIVEThis  is a modified FDA-approved test that has been validated and its performance characteristics determined by the reporting laboratory.  This laboratory is certified under the Clinical Laboratory Improvement Amendments CLIA as qualified to perform high complexity clinical laboratory testing.    PIP/TAZO Value in next row Sensitive      16 SENSITIVEThis is a modified FDA-approved test that has been validated and its performance characteristics determined by the reporting laboratory.  This laboratory is certified under the Clinical Laboratory Improvement Amendments CLIA as qualified to perform high complexity clinical laboratory testing.    CEFEPIME  Value in next row Sensitive      16 SENSITIVEThis is a modified FDA-approved test that has been validated and its performance characteristics determined by the reporting laboratory.  This laboratory is certified under the Clinical Laboratory Improvement Amendments CLIA as qualified to perform high complexity clinical laboratory testing.    CEFTAZIDIME/AVIBACTAM Value in next row Sensitive      16 SENSITIVEThis is a modified FDA-approved test that has been validated and its performance characteristics determined by the reporting laboratory.  This laboratory is certified under the Clinical Laboratory Improvement Amendments CLIA as qualified to perform high complexity clinical laboratory testing.     CEFTOLOZANE/TAZOBACTAM Value in next row Sensitive      16 SENSITIVEThis is a modified FDA-approved test that has been validated and its performance characteristics determined by the reporting laboratory.  This laboratory is certified under the Clinical Laboratory Improvement Amendments CLIA as qualified to perform high complexity clinical laboratory testing.    TOBRAMYCIN Value in next row Sensitive      16 SENSITIVEThis is a modified FDA-approved test that has been validated and its performance characteristics determined by the reporting laboratory.  This laboratory is certified under the Clinical Laboratory Improvement Amendments CLIA as qualified to perform high complexity clinical laboratory testing.    CEFTAZIDIME Value in next row Sensitive      16 SENSITIVEThis is a modified FDA-approved test that has been validated and its performance characteristics determined by the reporting laboratory.  This laboratory is certified under the Clinical Laboratory Improvement Amendments CLIA as qualified to perform high complexity clinical laboratory testing.    * ABUNDANT PSEUDOMONAS AERUGINOSA  Culture, blood (Routine X 2) w Reflex to ID Panel     Status: None   Collection Time: 06/05/24 11:25 PM   Specimen: BLOOD  Result Value Ref Range Status   Specimen Description BLOOD RIGHT ANTECUBITAL  Final   Special Requests   Final    BOTTLES DRAWN AEROBIC AND ANAEROBIC Blood Culture adequate volume   Culture   Final    NO GROWTH 5 DAYS Performed at [facility], [address] [place], [place] [address]    Report Status 06/10/2024 FINAL  Final    Coagulation Studies: No results for input(s): LABPROT, INR in the last 72 hours.   Urinalysis: No results for input(s): COLORURINE, LABSPEC, PHURINE, GLUCOSEU, HGBUR, BILIRUBINUR, KETONESUR, PROTEINUR, UROBILINOGEN, NITRITE, LEUKOCYTESUR in the last 72 hours.  Invalid input(s): APPERANCEUR    Imaging: No results  found.    Medications:    piperacillin -tazobactam 2.25 g (06/12/24 0329)    aspirin   81 mg Oral Daily   brimonidine   1 drop Both Eyes BID   And   timolol   1 drop Both Eyes BID   Chlorhexidine  Gluconate Cloth  6 each Topical Q0600   clopidogrel   75 mg Oral Daily   collagenase    Topical Daily   donepezil   10 mg Oral QHS  entecavir   0.5 mg Oral Q72H   heparin   5,000 Units Subcutaneous Q8H   latanoprost   1 drop Both Eyes QHS   memantine   5 mg Oral BID   senna-docusate  2 tablet Oral BID   simvastatin   10 mg Oral QHS   acetaminophen , hydrALAZINE , iodixanol , ondansetron  (ZOFRAN ) IV, oxyCODONE -acetaminophen , polyethylene glycol  Assessment/ Plan:  Anthony Houston is a 73 y.o.  male with past medical conditions including hypertension, GERD, dementia, CAD, and end-stage renal disease on hemodialysis.  Patient presents to the emergency department for evaluation of foot wound and pain and has been admitted for Foot ulcer (HCC) [L97.509] Right foot infection [L08.9]   End stage renal disease on hemodialysis.  Next treatment scheduled for Tuesday.   2. Anemia of chronic kidney disease Lab Results  Component Value Date   HGB 9.3 (L) 06/12/2024  Will order low dose Retacrit with dialysis treatments.   3. Secondary Hyperparathyroidism: with outpatient labs:none available  Lab Results  Component Value Date   PTH 101 (H) 07/26/2018   CALCIUM  8.3 (L) 06/09/2024   PHOS 3.9 06/09/2024    Bone minerals within optimal range.   4. Hypotension with chronic kidney disease. Prescribed Midodrine outpatient. Blood pressure stable today   LOS: 6 Anthony Houston 12/1/202511:49 AM

## 2024-06-13 DIAGNOSIS — Z992 Dependence on renal dialysis: Secondary | ICD-10-CM | POA: Diagnosis not present

## 2024-06-13 DIAGNOSIS — L089 Local infection of the skin and subcutaneous tissue, unspecified: Secondary | ICD-10-CM | POA: Diagnosis not present

## 2024-06-13 DIAGNOSIS — N186 End stage renal disease: Secondary | ICD-10-CM | POA: Diagnosis not present

## 2024-06-13 DIAGNOSIS — D631 Anemia in chronic kidney disease: Secondary | ICD-10-CM | POA: Diagnosis not present

## 2024-06-13 LAB — CBC
HCT: 26.8 % — ABNORMAL LOW (ref 39.0–52.0)
Hemoglobin: 8.8 g/dL — ABNORMAL LOW (ref 13.0–17.0)
MCH: 27.8 pg (ref 26.0–34.0)
MCHC: 32.8 g/dL (ref 30.0–36.0)
MCV: 84.8 fL (ref 80.0–100.0)
Platelets: 260 K/uL (ref 150–400)
RBC: 3.16 MIL/uL — ABNORMAL LOW (ref 4.22–5.81)
RDW: 15 % (ref 11.5–15.5)
WBC: 7 K/uL (ref 4.0–10.5)
nRBC: 0 % (ref 0.0–0.2)

## 2024-06-13 LAB — RENAL FUNCTION PANEL
Albumin: 3 g/dL — ABNORMAL LOW (ref 3.5–5.0)
Anion gap: 15 (ref 5–15)
BUN: 51 mg/dL — ABNORMAL HIGH (ref 8–23)
CO2: 25 mmol/L (ref 22–32)
Calcium: 8.5 mg/dL — ABNORMAL LOW (ref 8.9–10.3)
Chloride: 93 mmol/L — ABNORMAL LOW (ref 98–111)
Creatinine, Ser: 8.37 mg/dL — ABNORMAL HIGH (ref 0.61–1.24)
GFR, Estimated: 6 mL/min — ABNORMAL LOW (ref >60)
Glucose, Bld: 81 mg/dL (ref 70–99)
Phosphorus: 3.4 mg/dL (ref 2.5–4.6)
Potassium: 5 mmol/L (ref 3.5–5.1)
Sodium: 133 mmol/L — ABNORMAL LOW (ref 135–145)

## 2024-06-13 MED ORDER — SODIUM CHLORIDE 0.9 % IV SOLN: INTRAVENOUS | Status: DC

## 2024-06-13 MED ORDER — EPOETIN ALFA-EPBX 4000 UNIT/ML IJ SOLN
INTRAMUSCULAR | Status: AC
  Filled 2024-06-13: qty 1

## 2024-06-13 MED ORDER — CHLORHEXIDINE GLUCONATE 4 % EX SOLN
60.0000 mL | Freq: Once | CUTANEOUS | Status: AC
  Administered 2024-06-14: 4 via TOPICAL

## 2024-06-13 MED ORDER — CEFAZOLIN SODIUM-DEXTROSE 2-4 GM/100ML-% IV SOLN
2.0000 g | INTRAVENOUS | Status: AC
  Administered 2024-06-14: 2 g via INTRAVENOUS

## 2024-06-13 NOTE — Plan of Care (Signed)
  Problem: Education: °Goal: Knowledge of General Education information will improve °Description: Including pain rating scale, medication(s)/side effects and non-pharmacologic comfort measures °Outcome: Progressing °  °Problem: Clinical Measurements: °Goal: Will remain free from infection °Outcome: Progressing °Goal: Diagnostic test results will improve °Outcome: Progressing °  °Problem: Nutrition: °Goal: Adequate nutrition will be maintained °Outcome: Progressing °  °

## 2024-06-13 NOTE — Progress Notes (Addendum)
 Progress Note    06/13/2024 3:50 PM 6 Days Post-Op  Subjective:  Anthony Houston is a 73 yo male who is POD #6 from:  Date of Surgery: 06/07/2024   Surgeon(s):DEW,JASON     Assistants:none   Pre-operative Diagnosis: PAD with ulceration right lower extremity   Post-operative diagnosis:  Same   Procedure(s) Performed:             1.  Ultrasound guidance for vascular access left femoral artery             2.  Catheter placement into right common femoral artery from left femoral approach             3.  Aortogram and selective right lower extremity angiogram             4.  Percutaneous transluminal angioplasty of right peroneal artery with 2 inflations with a 3 mm diameter by 22 cm length angioplasty balloon             5.  Percutaneous transluminal angioplasty of the right distal SFA/proximal popliteal artery at Hunter's canal with 7 mm diameter by 10 cm length Lutonix drug-coated angioplasty balloon             6.  Stent placement to the right distal SFA/proximal popliteal artery at Hunter's canal with 8 mm diameter by 5 cm length Viabahn stent             7.  StarClose closure device left femoral artery   EBL: 5 cc   Contrast: 50 cc  Patient rested comfortably today in bed.  He does not complain of any pain to his lower extremity today.  Patient's vitals all remained stable.  No complications to note from his right foot ulceration/wound.  Patient remains confused but very pleasant and agreeable.   Vitals:   06/13/24 1128 06/13/24 1154  BP: 107/80 117/84  Pulse: 60 68  Resp: 16 17  Temp: 98.1 F (36.7 C) 98.3 F (36.8 C)  SpO2: 96% 100%   Physical Exam: Cardiac:  RRR, normal S1 and S2.  No murmurs appreciated. Lungs: Nonlabored breathing.  Clear throughout on auscultation.  No rales rhonchi or wheezing noted. Incisions: Left groin puncture incision with dressing clean dry and intact.  No hematoma seroma no. Extremities: All extremities warm to touch with exception  to right lower extremity.  Right lower extremity with Doppler pulses only in DP and PT but very weak.  Noted open sore ulcer to right foot.  Covered with bandage. Abdomen: Positive bowel sounds throughout, soft, nontender nondistended. Neurologic: Alert and oriented to self only.  Patient has a history of dementia with confusion.  CBC    Component Value Date/Time   WBC 7.0 06/13/2024 0740   RBC 3.16 (L) 06/13/2024 0740   HGB 8.8 (L) 06/13/2024 0740   HGB 13.9 04/01/2012 0508   HCT 26.8 (L) 06/13/2024 0740   HCT 41.2 04/01/2012 0508   PLT 260 06/13/2024 0740   PLT 235 04/01/2012 0508   MCV 84.8 06/13/2024 0740   MCV 84 04/01/2012 0508   MCH 27.8 06/13/2024 0740   MCHC 32.8 06/13/2024 0740   RDW 15.0 06/13/2024 0740   RDW 13.9 04/01/2012 0508   LYMPHSABS 1.6 06/12/2024 0800   LYMPHSABS 1.9 04/01/2012 0508   MONOABS 0.7 06/12/2024 0800   MONOABS 0.7 04/01/2012 0508   EOSABS 0.5 06/12/2024 0800   EOSABS 0.1 04/01/2012 0508   BASOSABS 0.0 06/12/2024 0800   BASOSABS 0.0 04/01/2012 0508  BMET    Component Value Date/Time   NA 133 (L) 06/13/2024 0740   NA 141 04/02/2012 0955   K 5.0 06/13/2024 0740   K 3.8 04/02/2012 0955   CL 93 (L) 06/13/2024 0740   CL 105 04/02/2012 0955   CO2 25 06/13/2024 0740   CO2 24 04/02/2012 0955   GLUCOSE 81 06/13/2024 0740   GLUCOSE 161 (H) 04/02/2012 0955   BUN 51 (H) 06/13/2024 0740   BUN 14 04/02/2012 0955   CREATININE 8.37 (H) 06/13/2024 0740   CREATININE 1.65 (H) 04/02/2012 0955   CALCIUM  8.5 (L) 06/13/2024 0740   CALCIUM  9.3 04/02/2012 0955   GFRNONAA 6 (L) 06/13/2024 0740   GFRNONAA 44 (L) 04/02/2012 0955   GFRAA 9 (L) 05/04/2019 2344   GFRAA 50 (L) 04/02/2012 0955    INR    Component Value Date/Time   INR 1.1 06/05/2024 2148     Intake/Output Summary (Last 24 hours) at 06/13/2024 1550 Last data filed at 06/13/2024 1300 Gross per 24 hour  Intake 180 ml  Output 1500 ml  Net -1320 ml     Assessment/Plan:  73 y.o. male  is s/p SEE ABOVE  6 Days Post-Op   PLAN Vascular surgery plans on taking the patient to the operating room tomorrow on 06/14/2024 for a right below the knee potation.  Dr. Selinda Gu MD spoke with the patient's brother [name] who is in [place]  and is the patient's power of attorney for consent for this procedure.  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  The surgical procedure was discussed in detail with the patient's brother.  Dr. Gu also discussed in detail the risks the benefits and the complications related to surgery.  All of [name] were answered today.  Patient will be made n.p.o. after midnight tonight.  Patient's aspirin  81 mg, Plavix  75 mg and heparin  5000 units subcu every 8 hours will be held for surgery tomorrow.   DVT prophylaxis: Heparin  5000 units subcu every 8 hours   Anthony Houston [facility] 06/13/2024 3:50 PM

## 2024-06-13 NOTE — Progress Notes (Signed)
 [facility]  ROUNDING NOTE   Subjective:   Anthony Houston is a 73 year old male with past medical conditions including hypertension, GERD, dementia, CAD, and end-stage renal disease on hemodialysis.  Patient presents to the emergency department for evaluation of foot wound and pain and has been admitted for Foot ulcer (HCC) [L97.509] Right foot infection [L08.9]  Patient is known to our practice and receives outpatient dialysis treatments at [facility] on a TTS schedule, supervised by Dr. Dennise.    Update:  Patient seen and evaluated during dialysis   HEMODIALYSIS FLOWSHEET:  Blood Flow Rate (mL/min): 399 mL/min Arterial Pressure (mmHg): -208.27 mmHg Venous Pressure (mmHg): 220.19 mmHg TMP (mmHg): -0.2 mmHg Ultrafiltration Rate (mL/min): 639 mL/min Dialysate Flow Rate (mL/min): 299 ml/min  Tolerating treatment well No complaints of pain  Objective:  Vital signs in last 24 hours:  Temp:  [97.6 F (36.4 C)-97.9 F (36.6 C)] 97.9 F (36.6 C) (12/02 0732) Pulse Rate:  [58-69] 65 (12/02 1030) Resp:  [15-19] 15 (12/02 1030) BP: (102-142)/(61-117) 106/68 (12/02 1030) SpO2:  [98 %-100 %] 99 % (12/02 1030) Weight:  [91.9 kg] 91.9 kg (12/02 0732)  Weight change:  Filed Weights   06/10/24 0734 06/10/24 1055 06/13/24 0732  Weight: 94.6 kg 93.3 kg 91.9 kg    Intake/Output: I/O last 3 completed shifts: In: 180 [P.O.:180] Out: 150 [Urine:150]   Intake/Output this shift:  No intake/output data recorded.  Physical Exam: General: NAD  Head: Normocephalic, atraumatic. Moist oral mucosal membranes  Eyes: Anicteric  Lungs:  Clear to auscultation, normal effort  Heart: Regular rate and rhythm  Abdomen:  Soft, nontender  Extremities: Trace peripheral edema.  Neurologic: Awake, alert  Skin: Warm,dry, Right lower extremity wound dressings  Access: Lt AVF    Basic Metabolic Panel: Recent Labs  Lab 06/07/24 0559 06/07/24 1648 06/09/24 1237  06/13/24 0740  NA 135  --  136 133*  K 4.3  --  4.3 5.0  CL 97*  --  97* 93*  CO2 26  --  27 25  GLUCOSE 88  --  125* 81  BUN 22  --  33* 51*  CREATININE 5.50*  --  7.33* 8.37*  CALCIUM  8.5*  --  8.3* 8.5*  PHOS  --  1.6* 3.9 3.4    Liver Function Tests: Recent Labs  Lab 06/09/24 1237 06/13/24 0740  ALBUMIN 2.9* 3.0*   No results for input(s): LIPASE, AMYLASE in the last 168 hours. No results for input(s): AMMONIA in the last 168 hours.  CBC: Recent Labs  Lab 06/07/24 1648 06/09/24 1237 06/12/24 0800 06/13/24 0740  WBC 6.6 5.9 6.3 7.0  NEUTROABS 4.0 3.3 3.5  --   HGB 10.1* 8.4* 9.3* 8.8*  HCT 31.5* 26.3* 28.4* 26.8*  MCV 85.8 87.1 85.0 84.8  PLT 303 260 273 260    Cardiac Enzymes: No results for input(s): CKTOTAL, CKMB, CKMBINDEX, TROPONINI in the last 168 hours.  BNP: Invalid input(s): POCBNP  CBG: Recent Labs  Lab 06/08/24 0904 06/09/24 1002 06/10/24 0715 06/11/24 0722  GLUCAP 88 92 115* 90    Microbiology: Results for orders placed or performed during the hospital encounter of 06/05/24  Culture, blood (single) w Reflex to ID Panel     Status: None   Collection Time: 06/05/24  4:58 PM   Specimen: BLOOD  Result Value Ref Range Status   Specimen Description BLOOD LEFT ANTECUBITAL  Final   Special Requests   Final    BOTTLES DRAWN AEROBIC  AND ANAEROBIC Blood Culture adequate volume   Culture   Final    NO GROWTH 5 DAYS Performed at [facility], [address] [place], [place] [address]    Report Status 06/11/2024 FINAL  Final  Culture, blood (Routine X 2) w Reflex to ID Panel     Status: None   Collection Time: 06/05/24  9:39 PM   Specimen: BLOOD  Result Value Ref Range Status   Specimen Description BLOOD BLOOD RIGHT ARM  Final   Special Requests   Final    BOTTLES DRAWN AEROBIC AND ANAEROBIC Blood Culture adequate volume   Culture   Final    NO GROWTH 5 DAYS Performed at [facility], [address], [place], [place] [address]    Report Status 06/11/2024 FINAL  Final  Aerobic Culture w Gram Stain (superficial specimen)     Status: None   Collection Time: 06/05/24  9:51 PM   Specimen: Wound  Result Value Ref Range Status   Specimen Description   Final    WOUND Performed at [facility], [address] [place], [place] [address]    Special Requests   Final    NONE Performed at [facility], [address] [place], [place] [address]    Gram Stain   Final    RARE WBC PRESENT, PREDOMINANTLY PMN RARE GRAM NEGATIVE RODS RARE GRAM POSITIVE COCCI Performed at [facility], [address] [place], [place] [address]    Culture   Final    ABUNDANT PSEUDOMONAS AERUGINOSA ABUNDANT ENTEROBACTER CLOACAE MODERATE KLEBSIELLA PNEUMONIAE    Report Status 06/10/2024 FINAL  Final   Organism ID, Bacteria ENTEROBACTER CLOACAE  Final   Organism ID, Bacteria KLEBSIELLA PNEUMONIAE  Final   Organism ID, Bacteria PSEUDOMONAS AERUGINOSA  Final      Susceptibility   Enterobacter cloacae - MIC*    CEFEPIME  <=0.12 SENSITIVE Sensitive     ERTAPENEM <=0.12 SENSITIVE Sensitive     CIPROFLOXACIN <=0.06 SENSITIVE Sensitive     GENTAMICIN <=1 SENSITIVE Sensitive     MEROPENEM <=0.25 SENSITIVE Sensitive     TRIMETH/SULFA <=20 SENSITIVE Sensitive     PIP/TAZO Value in next row Sensitive      <=4 SENSITIVEThis is a modified FDA-approved test that has been validated and its performance characteristics determined by the reporting laboratory.  This laboratory is certified under the Clinical Laboratory Improvement Amendments CLIA as qualified to perform high complexity clinical laboratory testing.    * ABUNDANT ENTEROBACTER CLOACAE   Klebsiella pneumoniae - MIC*    AMPICILLIN Value in next row Resistant      <=4 SENSITIVEThis is a modified FDA-approved test that has been validated and its performance characteristics determined by the reporting laboratory.  This laboratory is certified  under the Clinical Laboratory Improvement Amendments CLIA as qualified to perform high complexity clinical laboratory testing.    CEFAZOLIN  (NON-URINE) Value in next row Sensitive      <=4 SENSITIVEThis is a modified FDA-approved test that has been validated and its performance characteristics determined by the reporting laboratory.  This laboratory is certified under the Clinical Laboratory Improvement Amendments CLIA as qualified to perform high complexity clinical laboratory testing.    CEFEPIME  Value in next row Sensitive      <=4 SENSITIVEThis is a modified FDA-approved test that has been validated and its performance characteristics determined by the reporting laboratory.  This laboratory is certified under the Clinical Laboratory Improvement Amendments CLIA as qualified to perform  high complexity clinical laboratory testing.    ERTAPENEM Value in next row Sensitive      <=4 SENSITIVEThis is a modified FDA-approved test that has been validated and its performance characteristics determined by the reporting laboratory.  This laboratory is certified under the Clinical Laboratory Improvement Amendments CLIA as qualified to perform high complexity clinical laboratory testing.    CEFTRIAXONE  Value in next row Sensitive      <=4 SENSITIVEThis is a modified FDA-approved test that has been validated and its performance characteristics determined by the reporting laboratory.  This laboratory is certified under the Clinical Laboratory Improvement Amendments CLIA as qualified to perform high complexity clinical laboratory testing.    CIPROFLOXACIN Value in next row Sensitive      <=4 SENSITIVEThis is a modified FDA-approved test that has been validated and its performance characteristics determined by the reporting laboratory.  This laboratory is certified under the Clinical Laboratory Improvement Amendments CLIA as qualified to perform high complexity clinical laboratory testing.    GENTAMICIN Value in next  row Sensitive      <=4 SENSITIVEThis is a modified FDA-approved test that has been validated and its performance characteristics determined by the reporting laboratory.  This laboratory is certified under the Clinical Laboratory Improvement Amendments CLIA as qualified to perform high complexity clinical laboratory testing.    MEROPENEM Value in next row Sensitive      <=4 SENSITIVEThis is a modified FDA-approved test that has been validated and its performance characteristics determined by the reporting laboratory.  This laboratory is certified under the Clinical Laboratory Improvement Amendments CLIA as qualified to perform high complexity clinical laboratory testing.    TRIMETH/SULFA Value in next row Sensitive      <=4 SENSITIVEThis is a modified FDA-approved test that has been validated and its performance characteristics determined by the reporting laboratory.  This laboratory is certified under the Clinical Laboratory Improvement Amendments CLIA as qualified to perform high complexity clinical laboratory testing.    AMPICILLIN/SULBACTAM Value in next row Sensitive      <=4 SENSITIVEThis is a modified FDA-approved test that has been validated and its performance characteristics determined by the reporting laboratory.  This laboratory is certified under the Clinical Laboratory Improvement Amendments CLIA as qualified to perform high complexity clinical laboratory testing.    PIP/TAZO Value in next row Sensitive      <=4 SENSITIVEThis is a modified FDA-approved test that has been validated and its performance characteristics determined by the reporting laboratory.  This laboratory is certified under the Clinical Laboratory Improvement Amendments CLIA as qualified to perform high complexity clinical laboratory testing.    * MODERATE KLEBSIELLA PNEUMONIAE   Pseudomonas aeruginosa - MIC*    MEROPENEM Value in next row Sensitive      <=4 SENSITIVEThis is a modified FDA-approved test that has been  validated and its performance characteristics determined by the reporting laboratory.  This laboratory is certified under the Clinical Laboratory Improvement Amendments CLIA as qualified to perform high complexity clinical laboratory testing.    CIPROFLOXACIN Value in next row Sensitive      <=4 SENSITIVEThis is a modified FDA-approved test that has been validated and its performance characteristics determined by the reporting laboratory.  This laboratory is certified under the Clinical Laboratory Improvement Amendments CLIA as qualified to perform high complexity clinical laboratory testing.    IMIPENEM Value in next row Sensitive      <=4 SENSITIVEThis is a modified FDA-approved test that has been validated and its performance characteristics determined  by the reporting laboratory.  This laboratory is certified under the Clinical Laboratory Improvement Amendments CLIA as qualified to perform high complexity clinical laboratory testing.    PIP/TAZO Value in next row Sensitive      16 SENSITIVEThis is a modified FDA-approved test that has been validated and its performance characteristics determined by the reporting laboratory.  This laboratory is certified under the Clinical Laboratory Improvement Amendments CLIA as qualified to perform high complexity clinical laboratory testing.    CEFEPIME  Value in next row Sensitive      16 SENSITIVEThis is a modified FDA-approved test that has been validated and its performance characteristics determined by the reporting laboratory.  This laboratory is certified under the Clinical Laboratory Improvement Amendments CLIA as qualified to perform high complexity clinical laboratory testing.    CEFTAZIDIME/AVIBACTAM Value in next row Sensitive      16 SENSITIVEThis is a modified FDA-approved test that has been validated and its performance characteristics determined by the reporting laboratory.  This laboratory is certified under the Clinical Laboratory Improvement  Amendments CLIA as qualified to perform high complexity clinical laboratory testing.    CEFTOLOZANE/TAZOBACTAM Value in next row Sensitive      16 SENSITIVEThis is a modified FDA-approved test that has been validated and its performance characteristics determined by the reporting laboratory.  This laboratory is certified under the Clinical Laboratory Improvement Amendments CLIA as qualified to perform high complexity clinical laboratory testing.    TOBRAMYCIN Value in next row Sensitive      16 SENSITIVEThis is a modified FDA-approved test that has been validated and its performance characteristics determined by the reporting laboratory.  This laboratory is certified under the Clinical Laboratory Improvement Amendments CLIA as qualified to perform high complexity clinical laboratory testing.    CEFTAZIDIME Value in next row Sensitive      16 SENSITIVEThis is a modified FDA-approved test that has been validated and its performance characteristics determined by the reporting laboratory.  This laboratory is certified under the Clinical Laboratory Improvement Amendments CLIA as qualified to perform high complexity clinical laboratory testing.    * ABUNDANT PSEUDOMONAS AERUGINOSA  Culture, blood (Routine X 2) w Reflex to ID Panel     Status: None   Collection Time: 06/05/24 11:25 PM   Specimen: BLOOD  Result Value Ref Range Status   Specimen Description BLOOD RIGHT ANTECUBITAL  Final   Special Requests   Final    BOTTLES DRAWN AEROBIC AND ANAEROBIC Blood Culture adequate volume   Culture   Final    NO GROWTH 5 DAYS Performed at [facility], [address], [place], [place] [address]    Report Status 06/10/2024 FINAL  Final    Coagulation Studies: No results for input(s): LABPROT, INR in the last 72 hours.   Urinalysis: No results for input(s): COLORURINE, LABSPEC, PHURINE, GLUCOSEU, HGBUR, BILIRUBINUR, KETONESUR, PROTEINUR, UROBILINOGEN, NITRITE,  LEUKOCYTESUR in the last 72 hours.  Invalid input(s): APPERANCEUR    Imaging: No results found.    Medications:    piperacillin -tazobactam 2.25 g (06/13/24 0329)    aspirin   81 mg Oral Daily   brimonidine   1 drop Both Eyes BID   And   timolol   1 drop Both Eyes BID   Chlorhexidine  Gluconate Cloth  6 each Topical Q0600   clopidogrel   75 mg Oral Daily   collagenase    Topical Daily   donepezil   10 mg Oral QHS   entecavir   0.5 mg Oral Q72H   epoetin alfa-epbx (RETACRIT) injection  4,000 Units Intravenous Q T,Th,Sat-1800   heparin   5,000 Units Subcutaneous Q8H   latanoprost   1 drop Both Eyes QHS   memantine   5 mg Oral BID   senna-docusate  2 tablet Oral BID   simvastatin   10 mg Oral QHS   acetaminophen , hydrALAZINE , iodixanol , ondansetron  (ZOFRAN ) IV, oxyCODONE -acetaminophen , polyethylene glycol  Assessment/ Plan:  Anthony Houston is a 73 y.o.  male with past medical conditions including hypertension, GERD, dementia, CAD, and end-stage renal disease on hemodialysis.  Patient presents to the emergency department for evaluation of foot wound and pain and has been admitted for Foot ulcer (HCC) [L97.509] Right foot infection [L08.9]   End stage renal disease on hemodialysis.  Receiving dialysis today, UF 1.5L as tolerated. Next treatment scheduled for Thursday.    2. Anemia of chronic kidney disease Lab Results  Component Value Date   HGB 8.8 (L) 06/13/2024  Continue low dose Retacrit with dialysis treatments.   3. Secondary Hyperparathyroidism: with outpatient labs:none available  Lab Results  Component Value Date   PTH 101 (H) 07/26/2018   CALCIUM  8.5 (L) 06/13/2024   PHOS 3.4 06/13/2024    Calcium  and phos within optimal range.   4. Hypotension with chronic kidney disease. Prescribed Midodrine outpatient. Blood pressure 102/63 during dialysis   LOS: 7 Kimetha Trulson 12/2/202510:54 AM

## 2024-06-13 NOTE — Progress Notes (Signed)
 Hemodialysis Note:  Received patient in bed to unit. Alert and oriented. Informed consent singed and in chart.  Treatment initiated: 0747 Treatment completed: 1128  Access used: Left AVF Access issues: None  Patient tolerated well. Transported back to room, alert without acute distress. Report given to patient's RN.  Total UF removed: 1.5 Liters Medications given: Retacrit 4000 units IV  Post HD weight: 90.4 Kg  [facility]

## 2024-06-13 NOTE — Plan of Care (Signed)

## 2024-06-13 NOTE — H&P (View-Only) (Signed)
 Progress Note    06/13/2024 3:50 PM 6 Days Post-Op  Subjective:  Anthony Houston is a 73 yo male who is POD #6 from:  Date of Surgery: 06/07/2024   Surgeon(s):DEW,JASON     Assistants:none   Pre-operative Diagnosis: PAD with ulceration right lower extremity   Post-operative diagnosis:  Same   Procedure(s) Performed:             1.  Ultrasound guidance for vascular access left femoral artery             2.  Catheter placement into right common femoral artery from left femoral approach             3.  Aortogram and selective right lower extremity angiogram             4.  Percutaneous transluminal angioplasty of right peroneal artery with 2 inflations with a 3 mm diameter by 22 cm length angioplasty balloon             5.  Percutaneous transluminal angioplasty of the right distal SFA/proximal popliteal artery at Hunter's canal with 7 mm diameter by 10 cm length Lutonix drug-coated angioplasty balloon             6.  Stent placement to the right distal SFA/proximal popliteal artery at Hunter's canal with 8 mm diameter by 5 cm length Viabahn stent             7.  StarClose closure device left femoral artery   EBL: 5 cc   Contrast: 50 cc  Patient rested comfortably today in bed.  He does not complain of any pain to his lower extremity today.  Patient's vitals all remained stable.  No complications to note from his right foot ulceration/wound.  Patient remains confused but very pleasant and agreeable.   Vitals:   06/13/24 1128 06/13/24 1154  BP: 107/80 117/84  Pulse: 60 68  Resp: 16 17  Temp: 98.1 F (36.7 C) 98.3 F (36.8 C)  SpO2: 96% 100%   Physical Exam: Cardiac:  RRR, normal S1 and S2.  No murmurs appreciated. Lungs: Nonlabored breathing.  Clear throughout on auscultation.  No rales rhonchi or wheezing noted. Incisions: Left groin puncture incision with dressing clean dry and intact.  No hematoma seroma no. Extremities: All extremities warm to touch with exception  to right lower extremity.  Right lower extremity with Doppler pulses only in DP and PT but very weak.  Noted open sore ulcer to right foot.  Covered with bandage. Abdomen: Positive bowel sounds throughout, soft, nontender nondistended. Neurologic: Alert and oriented to self only.  Patient has a history of dementia with confusion.  CBC    Component Value Date/Time   WBC 7.0 06/13/2024 0740   RBC 3.16 (L) 06/13/2024 0740   HGB 8.8 (L) 06/13/2024 0740   HGB 13.9 04/01/2012 0508   HCT 26.8 (L) 06/13/2024 0740   HCT 41.2 04/01/2012 0508   PLT 260 06/13/2024 0740   PLT 235 04/01/2012 0508   MCV 84.8 06/13/2024 0740   MCV 84 04/01/2012 0508   MCH 27.8 06/13/2024 0740   MCHC 32.8 06/13/2024 0740   RDW 15.0 06/13/2024 0740   RDW 13.9 04/01/2012 0508   LYMPHSABS 1.6 06/12/2024 0800   LYMPHSABS 1.9 04/01/2012 0508   MONOABS 0.7 06/12/2024 0800   MONOABS 0.7 04/01/2012 0508   EOSABS 0.5 06/12/2024 0800   EOSABS 0.1 04/01/2012 0508   BASOSABS 0.0 06/12/2024 0800   BASOSABS 0.0 04/01/2012 0508  BMET    Component Value Date/Time   NA 133 (L) 06/13/2024 0740   NA 141 04/02/2012 0955   K 5.0 06/13/2024 0740   K 3.8 04/02/2012 0955   CL 93 (L) 06/13/2024 0740   CL 105 04/02/2012 0955   CO2 25 06/13/2024 0740   CO2 24 04/02/2012 0955   GLUCOSE 81 06/13/2024 0740   GLUCOSE 161 (H) 04/02/2012 0955   BUN 51 (H) 06/13/2024 0740   BUN 14 04/02/2012 0955   CREATININE 8.37 (H) 06/13/2024 0740   CREATININE 1.65 (H) 04/02/2012 0955   CALCIUM  8.5 (L) 06/13/2024 0740   CALCIUM  9.3 04/02/2012 0955   GFRNONAA 6 (L) 06/13/2024 0740   GFRNONAA 44 (L) 04/02/2012 0955   GFRAA 9 (L) 05/04/2019 2344   GFRAA 50 (L) 04/02/2012 0955    INR    Component Value Date/Time   INR 1.1 06/05/2024 2148     Intake/Output Summary (Last 24 hours) at 06/13/2024 1550 Last data filed at 06/13/2024 1300 Gross per 24 hour  Intake 180 ml  Output 1500 ml  Net -1320 ml     Assessment/Plan:  73 y.o. male  is s/p SEE ABOVE  6 Days Post-Op   PLAN Vascular surgery plans on taking the patient to the operating room tomorrow on 06/14/2024 for a right below the knee potation.  Dr. Selinda Gu MD spoke with the patient's brother Mr. Darold Ellen who is in [place]  and is the patient's power of attorney for consent for this procedure.  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  The surgical procedure was discussed in detail with the patient's brother.  Dr. Gu also discussed in detail the risks the benefits and the complications related to surgery.  All of Mr. Justus questions were answered today.  Patient will be made n.p.o. after midnight tonight.  Patient's aspirin  81 mg, Plavix  75 mg and heparin  5000 units subcu every 8 hours will be held for surgery tomorrow.   DVT prophylaxis: Heparin  5000 units subcu every 8 hours   Gwendlyn JONELLE [facility] 06/13/2024 3:50 PM

## 2024-06-14 ENCOUNTER — Inpatient Hospital Stay: Admitting: Anesthesiology

## 2024-06-14 ENCOUNTER — Encounter: Payer: Self-pay | Admitting: Internal Medicine

## 2024-06-14 ENCOUNTER — Encounter
Admission: EM | Disposition: A | Discharge status: Skilled Nursing Facility | Payer: Self-pay | Source: Skilled Nursing Facility | Attending: Internal Medicine

## 2024-06-14 DIAGNOSIS — L089 Local infection of the skin and subcutaneous tissue, unspecified: Secondary | ICD-10-CM | POA: Diagnosis not present

## 2024-06-14 HISTORY — PX: AMPUTATION: SHX166

## 2024-06-14 LAB — IRON AND TIBC
Iron: 162 ug/dL (ref 45–182)
Saturation Ratios: 73 % — ABNORMAL HIGH (ref 17.9–39.5)
TIBC: 221 ug/dL — ABNORMAL LOW (ref 250–450)
UIBC: 59 ug/dL

## 2024-06-14 LAB — GLUCOSE, CAPILLARY: Glucose-Capillary: 101 mg/dL — ABNORMAL HIGH (ref 70–99)

## 2024-06-14 LAB — ABO/RH: ABO/RH(D): B POS

## 2024-06-14 LAB — PREPARE RBC (CROSSMATCH)

## 2024-06-14 LAB — POCT I-STAT, CHEM 8
BUN: 33 mg/dL — ABNORMAL HIGH (ref 8–23)
Calcium, Ion: 1.1 mmol/L — ABNORMAL LOW (ref 1.15–1.40)
Chloride: 96 mmol/L — ABNORMAL LOW (ref 98–111)
Creatinine, Ser: 6.8 mg/dL — ABNORMAL HIGH (ref 0.61–1.24)
Glucose, Bld: 82 mg/dL (ref 70–99)
HCT: 32 % — ABNORMAL LOW (ref 39.0–52.0)
Hemoglobin: 10.9 g/dL — ABNORMAL LOW (ref 13.0–17.0)
Potassium: 5 mmol/L (ref 3.5–5.1)
Sodium: 132 mmol/L — ABNORMAL LOW (ref 135–145)
TCO2: 28 mmol/L (ref 22–32)

## 2024-06-14 LAB — VITAMIN D 25 HYDROXY (VIT D DEFICIENCY, FRACTURES): Vit D, 25-Hydroxy: 33.2 ng/mL (ref 30–100)

## 2024-06-14 LAB — MRSA NEXT GEN BY PCR, NASAL: MRSA by PCR Next Gen: NOT DETECTED

## 2024-06-14 LAB — FOLATE: Folate: 4.3 ng/mL — ABNORMAL LOW (ref >5.9)

## 2024-06-14 LAB — VITAMIN B12: Vitamin B-12: 319 pg/mL (ref 180–914)

## 2024-06-14 LAB — POTASSIUM: Potassium: 5.4 mmol/L — ABNORMAL HIGH (ref 3.5–5.1)

## 2024-06-14 SURGERY — AMPUTATION BELOW KNEE
Anesthesia: General | Site: Knee | Laterality: Right

## 2024-06-14 MED ORDER — SODIUM ZIRCONIUM CYCLOSILICATE 10 G PO PACK
10.0000 g | PACK | Freq: Once | ORAL | Status: AC
  Administered 2024-06-14: 10 g via ORAL
  Filled 2024-06-14: qty 1

## 2024-06-14 MED ORDER — GABAPENTIN 100 MG PO CAPS
200.0000 mg | ORAL_CAPSULE | Freq: Every day | ORAL | Status: DC
  Administered 2024-06-14 – 2024-06-18 (×5): 200 mg via ORAL
  Filled 2024-06-14 (×5): qty 2

## 2024-06-14 MED ORDER — ROCURONIUM BROMIDE 10 MG/ML (PF) SYRINGE
PREFILLED_SYRINGE | INTRAVENOUS | Status: AC
  Filled 2024-06-14: qty 10

## 2024-06-14 MED ORDER — FENTANYL CITRATE (PF) 100 MCG/2ML IJ SOLN
INTRAMUSCULAR | Status: DC | PRN
  Administered 2024-06-14: 25 ug via INTRAVENOUS
  Administered 2024-06-14: 50 ug via INTRAVENOUS
  Administered 2024-06-14: 25 ug via INTRAVENOUS

## 2024-06-14 MED ORDER — FENTANYL CITRATE (PF) 100 MCG/2ML IJ SOLN
INTRAMUSCULAR | Status: AC
  Filled 2024-06-14: qty 2

## 2024-06-14 MED ORDER — FOLIC ACID 1 MG PO TABS
1.0000 mg | ORAL_TABLET | Freq: Every day | ORAL | Status: DC
  Administered 2024-06-14 – 2024-06-19 (×5): 1 mg via ORAL
  Filled 2024-06-14 (×5): qty 1

## 2024-06-14 MED ORDER — LIDOCAINE HCL (CARDIAC) PF 100 MG/5ML IV SOSY
PREFILLED_SYRINGE | INTRAVENOUS | Status: DC | PRN
  Administered 2024-06-14: 80 mg via INTRAVENOUS

## 2024-06-14 MED ORDER — HYDRALAZINE HCL 20 MG/ML IJ SOLN: 10.0000 mg | INTRAMUSCULAR | Status: DC | PRN

## 2024-06-14 MED ORDER — PHENYLEPHRINE HCL (PRESSORS) 10 MG/ML IV SOLN
INTRAVENOUS | Status: DC | PRN
  Administered 2024-06-14 (×2): 80 ug via INTRAVENOUS

## 2024-06-14 MED ORDER — ACETAMINOPHEN 325 MG PO TABS
650.0000 mg | ORAL_TABLET | Freq: Three times a day (TID) | ORAL | Status: DC
  Administered 2024-06-14 – 2024-06-19 (×14): 650 mg via ORAL
  Filled 2024-06-14 (×14): qty 2

## 2024-06-14 MED ORDER — LIDOCAINE HCL (PF) 2 % IJ SOLN
INTRAMUSCULAR | Status: AC
  Filled 2024-06-14: qty 5

## 2024-06-14 MED ORDER — PROPOFOL 10 MG/ML IV BOLUS
INTRAVENOUS | Status: AC
  Filled 2024-06-14: qty 20

## 2024-06-14 MED ORDER — ROCURONIUM BROMIDE 100 MG/10ML IV SOLN
INTRAVENOUS | Status: DC | PRN
  Administered 2024-06-14: 40 mg via INTRAVENOUS

## 2024-06-14 MED ORDER — KETAMINE HCL 50 MG/5ML IJ SOSY
PREFILLED_SYRINGE | INTRAMUSCULAR | Status: DC | PRN
  Administered 2024-06-14: 20 mg via INTRAVENOUS

## 2024-06-14 MED ORDER — VASHE WOUND IRRIGATION OPTIME
TOPICAL | Status: DC | PRN
  Administered 2024-06-14: 34 [oz_av]

## 2024-06-14 MED ORDER — FENTANYL CITRATE (PF) 100 MCG/2ML IJ SOLN: 25.0000 ug | INTRAMUSCULAR | Status: DC | PRN

## 2024-06-14 MED ORDER — KETAMINE HCL 50 MG/5ML IJ SOSY
PREFILLED_SYRINGE | INTRAMUSCULAR | Status: AC
  Filled 2024-06-14: qty 5

## 2024-06-14 MED ORDER — 0.9 % SODIUM CHLORIDE (POUR BTL) OPTIME
TOPICAL | Status: DC | PRN
  Administered 2024-06-14: 1000 mL

## 2024-06-14 MED ORDER — HYDROMORPHONE HCL 2 MG PO TABS: 2.0000 mg | ORAL_TABLET | ORAL | Status: DC | PRN

## 2024-06-14 MED ORDER — OXYCODONE HCL 5 MG PO TABS: 5.0000 mg | ORAL_TABLET | Freq: Once | ORAL | Status: DC | PRN

## 2024-06-14 MED ORDER — ROCURONIUM BROMIDE 100 MG/10ML IV SOLN: INTRAVENOUS | Status: DC | PRN

## 2024-06-14 MED ORDER — CEFAZOLIN SODIUM-DEXTROSE 2-4 GM/100ML-% IV SOLN
INTRAVENOUS | Status: AC
  Filled 2024-06-14: qty 100

## 2024-06-14 MED ORDER — DEXAMETHASONE SOD PHOSPHATE PF 10 MG/ML IJ SOLN
INTRAMUSCULAR | Status: DC | PRN
  Administered 2024-06-14: 5 mg via INTRAVENOUS

## 2024-06-14 MED ORDER — ACETAMINOPHEN 10 MG/ML IV SOLN
INTRAVENOUS | Status: DC | PRN
  Administered 2024-06-14: 1000 mg via INTRAVENOUS

## 2024-06-14 MED ORDER — PROPOFOL 10 MG/ML IV BOLUS
INTRAVENOUS | Status: DC | PRN
  Administered 2024-06-14: 150 mg via INTRAVENOUS
  Administered 2024-06-14: 100 ug/kg/min via INTRAVENOUS

## 2024-06-14 MED ORDER — HYDROMORPHONE HCL 1 MG/ML IJ SOLN: 1.0000 mg | INTRAMUSCULAR | Status: DC | PRN

## 2024-06-14 MED ORDER — SUGAMMADEX SODIUM 500 MG/5ML IV SOLN
INTRAVENOUS | Status: DC | PRN
  Administered 2024-06-14: 100 mg via INTRAVENOUS

## 2024-06-14 MED ORDER — PHENYLEPHRINE HCL-NACL 20-0.9 MG/250ML-% IV SOLN
INTRAVENOUS | Status: DC | PRN
  Administered 2024-06-14: 10 ug/min via INTRAVENOUS

## 2024-06-14 MED ORDER — PROPOFOL 1000 MG/100ML IV EMUL
INTRAVENOUS | Status: AC
  Filled 2024-06-14: qty 100

## 2024-06-14 MED ORDER — OXYCODONE HCL 5 MG/5ML PO SOLN: 5.0000 mg | Freq: Once | ORAL | Status: DC | PRN

## 2024-06-14 SURGICAL SUPPLY — 37 items
BLADE SAGITTAL WIDE XTHICK NO (BLADE) IMPLANT
BLADE SAW SAG 25.4X90 (BLADE) ×1 IMPLANT
BLADE SURG SZ10 CARB STEEL (BLADE) IMPLANT
BNDG COHESIVE 4X5 TAN STRL LF (GAUZE/BANDAGES/DRESSINGS) ×1 IMPLANT
BNDG COMPR 6X5.8 VLCR NS LF (GAUZE/BANDAGES/DRESSINGS) ×1 IMPLANT
BNDG GAUZE DERMACEA FLUFF 4 (GAUZE/BANDAGES/DRESSINGS) ×2 IMPLANT
BRUSH SCRUB EZ 4% CHG (MISCELLANEOUS) ×1 IMPLANT
CHLORAPREP W/TINT 26 (MISCELLANEOUS) ×1 IMPLANT
CLEANSER WND VASHE 34 (WOUND CARE) IMPLANT
DRAPE INCISE IOBAN 66X45 STRL (DRAPES) IMPLANT
DRAPE U-SHAPE 47X51 STRL (DRAPES) IMPLANT
ELECT CAUTERY BLADE 6.4 (BLADE) ×1 IMPLANT
ELECTRODE REM PT RTRN 9FT ADLT (ELECTROSURGICAL) ×1 IMPLANT
GAUZE XEROFORM 1X8 LF (GAUZE/BANDAGES/DRESSINGS) ×2 IMPLANT
GLOVE BIO SURGEON STRL SZ7 (GLOVE) ×2 IMPLANT
GOWN STRL REUS W/ TWL LRG LVL3 (GOWN DISPOSABLE) ×2 IMPLANT
GOWN STRL REUS W/TWL 2XL LVL3 (GOWN DISPOSABLE) ×1 IMPLANT
HANDLE YANKAUER SUCT BULB TIP (MISCELLANEOUS) ×1 IMPLANT
KIT TURNOVER KIT A (KITS) ×1 IMPLANT
LABEL OR SOLS (LABEL) ×1 IMPLANT
MANIFOLD NEPTUNE II (INSTRUMENTS) ×1 IMPLANT
MAT ABSORB FLUID 56X50 GRAY (MISCELLANEOUS) ×1 IMPLANT
PACK EXTREMITY ARMC (MISCELLANEOUS) ×1 IMPLANT
PAD ABD DERMACEA PRESS 5X9 (GAUZE/BANDAGES/DRESSINGS) ×2 IMPLANT
PAD PREP OB/GYN DISP 24X41 (PERSONAL CARE ITEMS) ×1 IMPLANT
PENCIL SMOKE EVACUATOR (MISCELLANEOUS) ×1 IMPLANT
SOLN 0.9% NACL POUR BTL 1000ML (IV SOLUTION) ×1 IMPLANT
SOLN STERILE WATER 500 ML (IV SOLUTION) ×1 IMPLANT
SPONGE T-LAP 18X18 ~~LOC~~+RFID (SPONGE) ×1 IMPLANT
STAPLER SKIN PROX 35W (STAPLE) ×1 IMPLANT
STOCKINETTE M/LG 89821 (MISCELLANEOUS) ×1 IMPLANT
SUT SILK 2 0 SH (SUTURE) ×2 IMPLANT
SUT SILK 2-0 18XBRD TIE 12 (SUTURE) ×1 IMPLANT
SUT SILK 3-0 18XBRD TIE 12 (SUTURE) ×1 IMPLANT
SUT VIC AB 0 CT1 36 (SUTURE) ×2 IMPLANT
SUT VIC AB 2-0 CT1 (SUTURE) ×2 IMPLANT
TRAP FLUID SMOKE EVACUATOR (MISCELLANEOUS) ×1 IMPLANT

## 2024-06-14 NOTE — Plan of Care (Signed)
   Problem: Education: Goal: Knowledge of General Education information will improve Description Including pain rating scale, medication(s)/side effects and non-pharmacologic comfort measures Outcome: Progressing

## 2024-06-14 NOTE — Progress Notes (Signed)
  Progress Note   Patient: Anthony Houston FMW:[id number] DOB: February 25, 1951 DOA: 06/05/2024     8 DOS: the patient was seen and examined on 06/13/2024   Brief hospital course: Anthony Houston is a 73 y.o. male with medical history significant of HTN, CAD, GERD, dementia, ESRD-HD (MWF?), GIST, HBV, who presents with foot wound and foot pain.  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  Patient had angioplasty and drug-eluting stent placed on 11/16.  Continued on antibiotics.   Principal Problem:   Right foot infection with wound Active Problems:   ESRD on hemodialysis (HCC)   Hypertension   CAD (coronary artery disease)   Anemia in ESRD (end-stage renal disease) (HCC)   Diarrhea   Dementia (HCC)   Obesity (BMI 30-39.9)   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)   Hypophosphatemia   Assessment and Plan: Acute osteomyelitis of right foot Peripheral arterial disease. 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  Continue empiric antibiotics with vancomycin  and Zosyn  Discussed with vascular surgery, patient had angioplasty and drug-eluting stent placed. Wound culture grew Pseudomonas, Klebsiella as well as Enterobacter. Vascular surgery is considering BKA.  Vancomycin  discontinued.  Patient is pending surgery for BKA.  Continue antibiotics with Zosyn . Condition stable.  BKA scheduled for tomorrow.    ESRD on hemodialysis (MWF?) Hypophosphatemia. Anemia of end-stage renal disease. Continue dialysis per nephrology. Received sodium phosphate  11/27.  Hemoglobin increased to 9.3.  Stable.     Hypertension Continue Coreg  and as needed hydralazine    CAD (coronary artery disease) Continue Zocor  and aspirin    Diarrhea Condition seem to be improved, not able to collect stool   Dementia [facility]): No behavior disturbance -Donepezil  and Namenda     Obesity (BMI 30-39.9): Patient has Obesity Class II, with body weight 130.6 Kg and BMI 38  kg/m2.  Diet and excise.                 Subjective:  Patient doing well, no complaint  Physical Exam: Vitals:   06/13/24 1554 06/13/24 1959 06/14/24 0403 06/14/24 0814  BP: 112/73 117/65 118/64 124/75  Pulse: 67 72 71 69  Resp: 15 18 19 19   Temp: 98.4 F (36.9 C) 98.4 F (36.9 C) 99.1 F (37.3 C) 98.5 F (36.9 C)  TempSrc:  Oral Oral Oral  SpO2: 100% 99% 99% 99%  Weight:      Height:       General exam: Appears calm and comfortable  Respiratory system: Clear to auscultation. Respiratory effort normal. Cardiovascular system: S1 & S2 heard, RRR. No JVD, murmurs, rubs, gallops or clicks. No pedal edema. Gastrointestinal system: Abdomen is nondistended, soft and nontender. No organomegaly or masses felt. Normal bowel sounds heard. Central nervous system: Alert and oriented x2. No focal neurological deficits. Extremities: Symmetric 5 x 5 power. Skin: No rashes, lesions or ulcers Psychiatry: Judgement and insight appear normal. Mood & affect appropriate.    Data Reviewed:  Lab results reviewed  Family Communication: None  Disposition: Status is: Inpatient Remains inpatient appropriate because: severity of disease, iv antibiotics and inpatient procedure     Time spent: 35 minutes  Author: Murvin Mana, MD 06/14/2024 9:43 AM  For on call review [email or web address].

## 2024-06-14 NOTE — Anesthesia Preprocedure Evaluation (Signed)
 Anesthesia Evaluation  Patient identified by MRN, date of birth, ID band Patient awake    Reviewed: Allergy & Precautions, NPO status , Patient's Chart, lab work & pertinent test results  History of Anesthesia Complications Negative for: history of anesthetic complications  Airway Mallampati: III  TM Distance: >3 FB Neck ROM: full    Dental  (+) Chipped, Poor Dentition, Missing   Pulmonary neg shortness of breath   Pulmonary exam normal        Cardiovascular Exercise Tolerance: Good hypertension, + CAD  (-) Past MI Normal cardiovascular exam     Neuro/Psych       Dementia negative neurological ROS     GI/Hepatic negative GI ROS,,,(+) Hepatitis -  Endo/Other  negative endocrine ROS    Renal/GU DialysisRenal disease     Musculoskeletal   Abdominal   Peds  Hematology  (+) Blood dyscrasia, anemia   Anesthesia Other Findings Past Medical History: No date: CAD (coronary artery disease) No date: CKD (chronic kidney disease) No date: Cognitive disorder No date: Dementia (HCC) No date: 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 No date: Glaucoma No date: Hypertension No date: Knee pain, left No date: Multiple myeloma (HCC)  Past Surgical History: w/in 10 years: ABDOMINAL SURGERY     Comment:  to remove cancer  07/26/2018: DIALYSIS/PERMA CATHETER INSERTION; N/A     Comment:  Procedure: DIALYSIS/PERMA CATHETER INSERTION;  Surgeon:               Jama Cordella MATSU, MD;  Location: [facility];               Service: Cardiovascular;  Laterality: N/A; 05/05/2019: DIALYSIS/PERMA CATHETER INSERTION; N/A     Comment:  Procedure: DIALYSIS/PERMA CATHETER EXCHANGE;  Surgeon:               Jama Cordella MATSU, MD;  Location: [facility];               Service: Cardiovascular;  Laterality: N/A; 06/07/2024: LOWER EXTREMITY ANGIOGRAPHY; Right     Comment:  Procedure: Lower Extremity  Angiography;  Surgeon: Marea Selinda RAMAN, MD;  Location: [facility];  Service:               Cardiovascular;  Laterality: Right; No date: NO PAST SURGERIES 05/05/2019: PERIPHERAL VASCULAR THROMBECTOMY; Left     Comment:  Procedure: PERIPHERAL VASCULAR THROMBECTOMY;  Surgeon:               Jama Cordella MATSU, MD;  Location: [facility];               Service: Cardiovascular;  Laterality: Left;  BMI    Body Mass Index: 26.29 kg/m      Reproductive/Obstetrics negative OB ROS                              Anesthesia Physical Anesthesia Plan  ASA: 4  Anesthesia Plan: General ETT   Post-op Pain Management:    Induction: Intravenous  PONV Risk Score and Plan: Ondansetron , Dexamethasone, Midazolam  and Treatment may vary due to age or medical condition  Airway Management Planned: Oral ETT  Additional Equipment:   Intra-op Plan:   Post-operative Plan: Extubation in OR  Informed Consent: I have reviewed the patients History and Physical, chart, labs  and discussed the procedure including the risks, benefits and alternatives for the proposed anesthesia with the patient or authorized representative who has indicated his/her understanding and acceptance.     Dental Advisory Given  Plan Discussed with: Anesthesiologist, CRNA and Surgeon  Anesthesia Plan Comments: (History and phone consent from the patients brother Belynda Ellen at [phone]  Brother consented for risks of anesthesia including but not limited to:  - adverse reactions to medications - damage to eyes, teeth, lips or other oral mucosa - nerve damage due to positioning  - sore throat or hoarseness - Damage to heart, brain, nerves, lungs, other parts of body or loss of life  He voiced understanding and assent.)        Anesthesia Quick Evaluation

## 2024-06-14 NOTE — Progress Notes (Signed)
 Progress Note   Patient: Anthony Houston FMW:[id number] DOB: [birth year] DOA: 06/05/2024     8 DOS: the patient was seen and examined on 06/13/2024   Brief hospital course: Anthony Houston is a 73 y.o. male with medical history significant of HTN, CAD, GERD, dementia, ESRD-HD (TTS), GIST, HBV, who presents with foot wound and foot pain.  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  Patient had angioplasty and drug-eluting stent placed on 11/16.  Continued on antibiotics.   Principal Problem:   Right foot infection with wound Active Problems:   ESRD on hemodialysis (HCC)   Hypertension   CAD (coronary artery disease)   Anemia in ESRD (end-stage renal disease) (HCC)   Diarrhea   Dementia (HCC)   Obesity (BMI 30-39.9)   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)   Hypophosphatemia   Assessment and Plan:  # Acute osteomyelitis of right foot # Peripheral arterial disease. 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  Continue empiric antibiotics with vancomycin  and Zosyn  Discussed with vascular surgery, patient had angioplasty and drug-eluting stent placed. Wound culture grew Pseudomonas, Klebsiella as well as Enterobacter. Vascular surgery Rec BKA.  Vancomycin  discontinued.  Continue antibiotics with Zosyn . 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 3 times daily scheduled, Dilaudid  p.o. as needed and Dilaudid  IV for breakthrough pain Gabapentin 200 mg p.o. 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 # Anemia of end-stage renal disease. Continue dialysis per nephrology. Received sodium phosphate  11/27.  Hemoglobin increased to 9.3.  Stable.   # Hyperkalemia 12/3 Lokelma x 1 dose given Monitor chemistry daily    # Hypertension: Continue Coreg  and as needed hydralazine    # CAD (coronary artery disease): Continue  Zocor  and aspirin    # Diarrhea: Condition seem to be improved, not able to collect stool   # Dementia: No behavior disturbance -Donepezil  and Namenda   # Folic acid deficiency, folate level 4.3, started folic acid 1 mg p.o. daily.  Follow with PCP to repeat folic acid level in between 3 to 6 months.   Body mass index is 26.29 kg/m.   Subjective:  No significant overnight event.  Patient was seen after right BKA, stated that pain is 8/10, resting comfortably, no acute distress noticed. Patient did not offer any other complaints. Patient is awake and alert, oriented x 2  Physical Exam: Vitals:   06/14/24 1018 06/14/24 1330 06/14/24 1345 06/14/24 1400  BP: 123/62 (!) 99/55 (!) 143/71 133/64  Pulse: 61 (!) 57 64 (!) 58  Resp: 18 (!) 25 (!) 24 (!) 23  Temp: 97.6 F (36.4 C) (!) 96.6 F (35.9 C)    TempSrc: Temporal     SpO2: 97% 100% 100% 98%  Weight:      Height:       General exam: Appears calm and comfortable  Respiratory system: Clear to auscultation. Respiratory effort normal. Cardiovascular system: S1 & S2 heard, RRR. No JVD, murmurs, rubs, gallops or clicks. No pedal edema. Gastrointestinal system: BS +, Abdomen is nondistended, soft and nontender. Central nervous system: Alert and oriented x2. No focal neurological deficits. Extremities: s/p Right BKA, dressing CDI Skin: No rashes, lesions or ulcers Psychiatry: Judgement and insight appear normal. Mood & affect appropriate.    Data Reviewed:  Lab results reviewed  Family Communication: None  Disposition: Status is: Inpatient Remains inpatient appropriate because: severity of  disease, iv antibiotics and inpatient procedure     Time spent: 35 minutes  Author: Elvan Sor, MD 06/14/2024 2:31 PM  For on call review [email or web address]

## 2024-06-14 NOTE — Progress Notes (Signed)
 [facility]  ROUNDING NOTE   Subjective:   Anthony Houston is a 73 year old male with past medical conditions including hypertension, GERD, dementia, CAD, and end-stage renal disease on hemodialysis.  Patient presents to the emergency department for evaluation of foot wound and pain and has been admitted for Foot ulcer (HCC) [L97.509] Right foot infection [L08.9]  Patient is known to our practice and receives outpatient dialysis treatments at [facility] on a TTS schedule, supervised by Dr. Dennise.    Update:  Patient seen sitting up in bed NPO for vascular procedure No complaints at this time  Objective:  Vital signs in last 24 hours:  Temp:  [96.6 F (35.9 C)-99.1 F (37.3 C)] 96.6 F (35.9 C) (12/03 1330) Pulse Rate:  [57-72] 57 (12/03 1330) Resp:  [15-25] 25 (12/03 1330) BP: (99-124)/(55-75) 99/55 (12/03 1330) SpO2:  [97 %-100 %] 100 % (12/03 1330)  Weight change:  Filed Weights   06/10/24 1055 06/13/24 0732 06/13/24 1128  Weight: 93.3 kg 91.9 kg 90.4 kg    Intake/Output: I/O last 3 completed shifts: In: 1370.7 [P.O.:420; I.V.:48.4; IV Piggyback:902.3] Out: 1700 [Urine:200; Other:1500]   Intake/Output this shift:  Total I/O In: -  Out: 150 [Blood:150]  Physical Exam: General: NAD  Head: Normocephalic, atraumatic. Moist oral mucosal membranes  Eyes: Anicteric  Lungs:  Clear to auscultation, normal effort  Heart: Regular rate and rhythm  Abdomen:  Soft, nontender  Extremities: Trace peripheral edema.  Neurologic: Awake, alert  Skin: Warm,dry, Right lower extremity wound dressings  Access: Lt AVF    Basic Metabolic Panel: Recent Labs  Lab 06/07/24 1648 06/09/24 1237 06/13/24 0740 06/14/24 1138  NA  --  136 133* 132*  K  --  4.3 5.0 5.0  CL  --  97* 93* 96*  CO2  --  27 25  --   GLUCOSE  --  125* 81 82  BUN  --  33* 51* 33*  CREATININE  --  7.33* 8.37* 6.80*  CALCIUM   --  8.3* 8.5*  --   PHOS 1.6* 3.9 3.4  --      Liver Function Tests: Recent Labs  Lab 06/09/24 1237 06/13/24 0740  ALBUMIN 2.9* 3.0*   No results for input(s): LIPASE, AMYLASE in the last 168 hours. No results for input(s): AMMONIA in the last 168 hours.  CBC: Recent Labs  Lab 06/07/24 1648 06/09/24 1237 06/12/24 0800 06/13/24 0740 06/14/24 1138  WBC 6.6 5.9 6.3 7.0  --   NEUTROABS 4.0 3.3 3.5  --   --   HGB 10.1* 8.4* 9.3* 8.8* 10.9*  HCT 31.5* 26.3* 28.4* 26.8* 32.0*  MCV 85.8 87.1 85.0 84.8  --   PLT 303 260 273 260  --     Cardiac Enzymes: No results for input(s): CKTOTAL, CKMB, CKMBINDEX, TROPONINI in the last 168 hours.  BNP: Invalid input(s): POCBNP  CBG: Recent Labs  Lab 06/08/24 0904 06/09/24 1002 06/10/24 0715 06/11/24 0722 06/14/24 0815  GLUCAP 88 92 115* 90 101*    Microbiology: Results for orders placed or performed during the hospital encounter of 06/05/24  Culture, blood (single) w Reflex to ID Panel     Status: None   Collection Time: 06/05/24  4:58 PM   Specimen: BLOOD  Result Value Ref Range Status   Specimen Description BLOOD LEFT ANTECUBITAL  Final   Special Requests   Final    BOTTLES DRAWN AEROBIC AND ANAEROBIC Blood Culture adequate volume   Culture   Final  NO GROWTH 5 DAYS Performed at [facility], [address] [place], [place] [address]    Report Status 06/11/2024 FINAL  Final  Culture, blood (Routine X 2) w Reflex to ID Panel     Status: None   Collection Time: 06/05/24  9:39 PM   Specimen: BLOOD  Result Value Ref Range Status   Specimen Description BLOOD BLOOD RIGHT ARM  Final   Special Requests   Final    BOTTLES DRAWN AEROBIC AND ANAEROBIC Blood Culture adequate volume   Culture   Final    NO GROWTH 5 DAYS Performed at [facility], [address], [place], [place] [address]    Report Status 06/11/2024 FINAL  Final  Aerobic Culture w Gram Stain (superficial specimen)     Status: None   Collection Time: 06/05/24  9:51  PM   Specimen: Wound  Result Value Ref Range Status   Specimen Description   Final    WOUND Performed at [facility], [address], [place], [place] [address]    Special Requests   Final    NONE Performed at [facility], [address] [place], [place] [address]    Gram Stain   Final    RARE WBC PRESENT, PREDOMINANTLY PMN RARE GRAM NEGATIVE RODS RARE GRAM POSITIVE COCCI Performed at [facility], [address], [place], [place] [address]    Culture   Final    ABUNDANT PSEUDOMONAS AERUGINOSA ABUNDANT ENTEROBACTER CLOACAE MODERATE KLEBSIELLA PNEUMONIAE    Report Status 06/10/2024 FINAL  Final   Organism ID, Bacteria ENTEROBACTER CLOACAE  Final   Organism ID, Bacteria KLEBSIELLA PNEUMONIAE  Final   Organism ID, Bacteria PSEUDOMONAS AERUGINOSA  Final      Susceptibility   Enterobacter cloacae - MIC*    CEFEPIME  <=0.12 SENSITIVE Sensitive     ERTAPENEM <=0.12 SENSITIVE Sensitive     CIPROFLOXACIN <=0.06 SENSITIVE Sensitive     GENTAMICIN <=1 SENSITIVE Sensitive     MEROPENEM <=0.25 SENSITIVE Sensitive     TRIMETH/SULFA <=20 SENSITIVE Sensitive     PIP/TAZO Value in next row Sensitive      <=4 SENSITIVEThis is a modified FDA-approved test that has been validated and its performance characteristics determined by the reporting laboratory.  This laboratory is certified under the Clinical Laboratory Improvement Amendments CLIA as qualified to perform high complexity clinical laboratory testing.    * ABUNDANT ENTEROBACTER CLOACAE   Klebsiella pneumoniae - MIC*    AMPICILLIN Value in next row Resistant      <=4 SENSITIVEThis is a modified FDA-approved test that has been validated and its performance characteristics determined by the reporting laboratory.  This laboratory is certified under the Clinical Laboratory Improvement Amendments CLIA as qualified to perform high complexity clinical laboratory testing.    CEFAZOLIN  (NON-URINE) Value in next row  Sensitive      <=4 SENSITIVEThis is a modified FDA-approved test that has been validated and its performance characteristics determined by the reporting laboratory.  This laboratory is certified under the Clinical Laboratory Improvement Amendments CLIA as qualified to perform high complexity clinical laboratory testing.    CEFEPIME  Value in next row Sensitive      <=4 SENSITIVEThis is a modified FDA-approved test that has been validated and its performance characteristics determined by the reporting laboratory.  This laboratory is certified under the Clinical Laboratory Improvement Amendments CLIA as qualified to perform high complexity clinical laboratory testing.    ERTAPENEM Value in next row Sensitive      <=  4 SENSITIVEThis is a modified FDA-approved test that has been validated and its performance characteristics determined by the reporting laboratory.  This laboratory is certified under the Clinical Laboratory Improvement Amendments CLIA as qualified to perform high complexity clinical laboratory testing.    CEFTRIAXONE  Value in next row Sensitive      <=4 SENSITIVEThis is a modified FDA-approved test that has been validated and its performance characteristics determined by the reporting laboratory.  This laboratory is certified under the Clinical Laboratory Improvement Amendments CLIA as qualified to perform high complexity clinical laboratory testing.    CIPROFLOXACIN Value in next row Sensitive      <=4 SENSITIVEThis is a modified FDA-approved test that has been validated and its performance characteristics determined by the reporting laboratory.  This laboratory is certified under the Clinical Laboratory Improvement Amendments CLIA as qualified to perform high complexity clinical laboratory testing.    GENTAMICIN Value in next row Sensitive      <=4 SENSITIVEThis is a modified FDA-approved test that has been validated and its performance characteristics determined by the reporting laboratory.   This laboratory is certified under the Clinical Laboratory Improvement Amendments CLIA as qualified to perform high complexity clinical laboratory testing.    MEROPENEM Value in next row Sensitive      <=4 SENSITIVEThis is a modified FDA-approved test that has been validated and its performance characteristics determined by the reporting laboratory.  This laboratory is certified under the Clinical Laboratory Improvement Amendments CLIA as qualified to perform high complexity clinical laboratory testing.    TRIMETH/SULFA Value in next row Sensitive      <=4 SENSITIVEThis is a modified FDA-approved test that has been validated and its performance characteristics determined by the reporting laboratory.  This laboratory is certified under the Clinical Laboratory Improvement Amendments CLIA as qualified to perform high complexity clinical laboratory testing.    AMPICILLIN/SULBACTAM Value in next row Sensitive      <=4 SENSITIVEThis is a modified FDA-approved test that has been validated and its performance characteristics determined by the reporting laboratory.  This laboratory is certified under the Clinical Laboratory Improvement Amendments CLIA as qualified to perform high complexity clinical laboratory testing.    PIP/TAZO Value in next row Sensitive      <=4 SENSITIVEThis is a modified FDA-approved test that has been validated and its performance characteristics determined by the reporting laboratory.  This laboratory is certified under the Clinical Laboratory Improvement Amendments CLIA as qualified to perform high complexity clinical laboratory testing.    * MODERATE KLEBSIELLA PNEUMONIAE   Pseudomonas aeruginosa - MIC*    MEROPENEM Value in next row Sensitive      <=4 SENSITIVEThis is a modified FDA-approved test that has been validated and its performance characteristics determined by the reporting laboratory.  This laboratory is certified under the Clinical Laboratory Improvement Amendments CLIA as  qualified to perform high complexity clinical laboratory testing.    CIPROFLOXACIN Value in next row Sensitive      <=4 SENSITIVEThis is a modified FDA-approved test that has been validated and its performance characteristics determined by the reporting laboratory.  This laboratory is certified under the Clinical Laboratory Improvement Amendments CLIA as qualified to perform high complexity clinical laboratory testing.    IMIPENEM Value in next row Sensitive      <=4 SENSITIVEThis is a modified FDA-approved test that has been validated and its performance characteristics determined by the reporting laboratory.  This laboratory is certified under the Clinical Laboratory Improvement Amendments CLIA as qualified to  perform high complexity clinical laboratory testing.    PIP/TAZO Value in next row Sensitive      16 SENSITIVEThis is a modified FDA-approved test that has been validated and its performance characteristics determined by the reporting laboratory.  This laboratory is certified under the Clinical Laboratory Improvement Amendments CLIA as qualified to perform high complexity clinical laboratory testing.    CEFEPIME  Value in next row Sensitive      16 SENSITIVEThis is a modified FDA-approved test that has been validated and its performance characteristics determined by the reporting laboratory.  This laboratory is certified under the Clinical Laboratory Improvement Amendments CLIA as qualified to perform high complexity clinical laboratory testing.    CEFTAZIDIME/AVIBACTAM Value in next row Sensitive      16 SENSITIVEThis is a modified FDA-approved test that has been validated and its performance characteristics determined by the reporting laboratory.  This laboratory is certified under the Clinical Laboratory Improvement Amendments CLIA as qualified to perform high complexity clinical laboratory testing.    CEFTOLOZANE/TAZOBACTAM Value in next row Sensitive      16 SENSITIVEThis is a modified  FDA-approved test that has been validated and its performance characteristics determined by the reporting laboratory.  This laboratory is certified under the Clinical Laboratory Improvement Amendments CLIA as qualified to perform high complexity clinical laboratory testing.    TOBRAMYCIN Value in next row Sensitive      16 SENSITIVEThis is a modified FDA-approved test that has been validated and its performance characteristics determined by the reporting laboratory.  This laboratory is certified under the Clinical Laboratory Improvement Amendments CLIA as qualified to perform high complexity clinical laboratory testing.    CEFTAZIDIME Value in next row Sensitive      16 SENSITIVEThis is a modified FDA-approved test that has been validated and its performance characteristics determined by the reporting laboratory.  This laboratory is certified under the Clinical Laboratory Improvement Amendments CLIA as qualified to perform high complexity clinical laboratory testing.    * ABUNDANT PSEUDOMONAS AERUGINOSA  Culture, blood (Routine X 2) w Reflex to ID Panel     Status: None   Collection Time: 06/05/24 11:25 PM   Specimen: BLOOD  Result Value Ref Range Status   Specimen Description BLOOD RIGHT ANTECUBITAL  Final   Special Requests   Final    BOTTLES DRAWN AEROBIC AND ANAEROBIC Blood Culture adequate volume   Culture   Final    NO GROWTH 5 DAYS Performed at [facility], [address], [place], [place] [address]    Report Status 06/10/2024 FINAL  Final  MRSA Next Gen by PCR, Nasal     Status: None   Collection Time: 06/09/24  8:11 AM   Specimen: Nasal Mucosa; Nasal Swab  Result Value Ref Range Status   MRSA by PCR Next Gen NOT DETECTED NOT DETECTED Final    Comment: (NOTE) The GeneXpert MRSA Assay (FDA approved for NASAL specimens only), is one component of a comprehensive MRSA colonization surveillance program. It is not intended to diagnose MRSA infection nor to guide or monitor  treatment for MRSA infections. Test performance is not FDA approved in patients less than 56 years old. Performed at [facility], [address] [place], [place] [address]     Coagulation Studies: No results for input(s): LABPROT, INR in the last 72 hours.   Urinalysis: No results for input(s): COLORURINE, LABSPEC, PHURINE, GLUCOSEU, HGBUR, BILIRUBINUR, KETONESUR, PROTEINUR, UROBILINOGEN, NITRITE, LEUKOCYTESUR in the last 72 hours.  Invalid input(s): APPERANCEUR  Imaging: No results found.    Medications:    sodium chloride  10 mL/hr at 06/14/24 1153   [MAR Hold] piperacillin -tazobactam 2.25 g (06/14/24 0316)    [MAR Hold] aspirin   81 mg Oral Daily   [MAR Hold] brimonidine   1 drop Both Eyes BID   And   [MAR Hold] timolol   1 drop Both Eyes BID   [MAR Hold] Chlorhexidine  Gluconate Cloth  6 each Topical Q0600   [MAR Hold] clopidogrel   75 mg Oral Daily   [MAR Hold] collagenase    Topical Daily   [MAR Hold] donepezil   10 mg Oral QHS   [MAR Hold] entecavir   0.5 mg Oral Q72H   [MAR Hold] epoetin alfa-epbx (RETACRIT) injection  4,000 Units Intravenous Q T,Th,Sat-1800   [MAR Hold] heparin   5,000 Units Subcutaneous Q8H   [MAR Hold] latanoprost   1 drop Both Eyes QHS   [MAR Hold] memantine   5 mg Oral BID   [MAR Hold] senna-docusate  2 tablet Oral BID   [MAR Hold] simvastatin   10 mg Oral QHS   [MAR Hold] acetaminophen , fentaNYL  (SUBLIMAZE ) injection, [MAR Hold] hydrALAZINE , iodixanol , [MAR Hold] ondansetron  (ZOFRAN ) IV, oxyCODONE  **OR** oxyCODONE , [MAR Hold] oxyCODONE -acetaminophen , [MAR Hold] polyethylene glycol  Assessment/ Plan:  [name] is a 73 y.o.  male with past medical conditions including hypertension, GERD, dementia, CAD, and end-stage renal disease on hemodialysis.  Patient presents to the emergency department for evaluation of foot wound and pain and has been admitted for Foot ulcer (HCC) [L97.509] Right foot  infection [L08.9]   End stage renal disease on hemodialysis.  Dialysis received yesterday, UF 1.5L achieved. Next treatment scheduled for Thursday.   2. Anemia of chronic kidney disease Lab Results  Component Value Date   HGB 10.9 (L) 06/14/2024  Will hold ESA for now  3. Secondary Hyperparathyroidism: with outpatient labs:none available  Lab Results  Component Value Date   PTH 101 (H) 07/26/2018   CALCIUM  8.5 (L) 06/13/2024   CAION 1.10 (L) 06/14/2024   PHOS 3.4 06/13/2024    Calcium  and phos within optimal range.   4. Hypotension with chronic kidney disease. Prescribed Midodrine outpatient. Currently held   LOS: 8 Anthony Houston 12/3/20251:45 PM

## 2024-06-14 NOTE — Anesthesia Procedure Notes (Signed)
 Procedure Name: Intubation Date/Time: 06/14/2024 12:05 PM  Performed by: Gillermo Spruce I, CRNAPre-anesthesia Checklist: Patient identified, Emergency Drugs available, Suction available and Patient being monitored Patient Re-evaluated:Patient Re-evaluated prior to induction Oxygen Delivery Method: Circle system utilized Preoxygenation: Pre-oxygenation with 100% oxygen Induction Type: IV induction Ventilation: Mask ventilation without difficulty Laryngoscope Size: McGrath and 4 Grade View: Grade I Tube type: Oral Tube size: 7.5 mm Number of attempts: 1 Airway Equipment and Method: Stylet and Oral airway Placement Confirmation: ETT inserted through vocal cords under direct vision, positive ETCO2 and breath sounds checked- equal and bilateral Secured at: 23 cm Tube secured with: Tape Dental Injury: Teeth and Oropharynx as per pre-operative assessment

## 2024-06-14 NOTE — Transfer of Care (Signed)
 Immediate Anesthesia Transfer of Care Note  Patient: Anthony Houston  Procedure(s) Performed: AMPUTATION BELOW KNEE (Right: Knee)  Patient Location: PACU  Anesthesia Type:General  Level of Consciousness: drowsy, patient cooperative, and responds to stimulation  Airway & Oxygen Therapy: Patient Spontanous Breathing and Patient connected to face mask oxygen  Post-op Assessment: Report given to RN and Post -op Vital signs reviewed and stable  Post vital signs: stable  Last Vitals:  Vitals Value Taken Time  BP 99/55 06/14/24 13:30  Temp    Pulse 57 06/14/24 13:31  Resp 26 06/14/24 13:31  SpO2 100 % 06/14/24 13:31  Vitals shown include unfiled device data.  Last Pain:  Vitals:   06/14/24 1018  TempSrc: Temporal  PainSc: 0-No pain      Patients Stated Pain Goal: 0 (06/12/24 1935)  Complications: No notable events documented.

## 2024-06-14 NOTE — Op Note (Signed)
 OPERATIVE NOTE   PROCEDURE: Right below-the-knee amputation  PRE-OPERATIVE DIAGNOSIS: Right foot ulceration and infection, nonsalvageable right foot, PAD  POST-OPERATIVE DIAGNOSIS: same as above  SURGEON: Selinda Gu, MD  ASSISTANT(S): none  ANESTHESIA: general  ESTIMATED BLOOD LOSS: 150 cc  FINDING(S): none  SPECIMEN(S):  Right below-the-knee amputation  INDICATIONS:   Anthony Houston is a 73 y.o. male who presents with right foot ulceration and infection and not felt to be salvageable by podiatry. He has had revascularization for his PAD.  The patient is scheduled for a right below-the-knee amputation.  I discussed in depth with the patient the risks, benefits, and alternatives to this procedure.  The patient is aware that the risk of this operation included but are not limited to:  bleeding, infection, myocardial infarction, stroke, death, failure to heal amputation wound, and possible need for more proximal amputation.  The patient is aware of the risks and agrees proceed forward with the procedure.  DESCRIPTION:  After full informed written consent was obtained from the patient, the patient was brought back to the operating room, and placed supine upon the operating table.  Prior to induction, the patient received IV antibiotics.  The patient was then prepped and draped in the standard fashion for a below-the-knee amputation.  After obtaining adequate anesthesia, the patient was prepped and draped in the standard fashion for a right below-the-knee amputation.  I marked out the anterior incision two finger breadths below the tibial tuberosity and then the marked out a posterior flap that was one third of the circumference of the calf in length.   I made the incisions for these flaps, and then dissected through the subcutaneous tissue, fascia, and muscle anteriorly.  I elevated  the periosteal tissue superiorly so that the tibia was about 3-4 cm shorter than the anterior skin flap.   I then transected the tibia with a power saw and then took a wedge off the tibia anteriorly with the power saw.  Then I smoothed out the rough edges.  In a similar fashion, I cut back the fibula about two centimeters higher than the level of the tibia with a bone cutter.  I put a bone hook into the distal tibia and then used a large amputation knife to sharply develop a tissue plane through the muscle along the fibula.  In such fashion, the posterior flap was developed.  At this point, the specimen was passed off the field as the below-the-knee amputation.  At this point, I clamped all visibly bleeding arteries and veins using a combination of suture ligation with Silk suture and electrocautery.  Bleeding continued to be controlled with electrocautery and suture ligature.  The stump was washed off with sterile normal saline and no further active bleeding was noted.  I reapproximated the anterior and posterior fascia  with interrupted stitches of 0 Vicryl.  This was completed along the entire length of anterior and posterior fascia until there were no more loose space in the fascial line. I then placed a layer of 2-0 Vicryl sutures in the subcutaneous tissue. The skin was then  reapproximated with staples.  The stump was washed off and dried.  The incision was dressed with Xeroform and  then fluffs were applied.  Kerlix was wrapped around the leg and then gently an ACE wrap was applied.    COMPLICATIONS: none  CONDITION: stable   Selinda Gu  06/14/2024, 1:11 PM    This note was created with Dragon Medical transcription system. Any  errors in dictation are purely unintentional.

## 2024-06-14 NOTE — Progress Notes (Signed)
 Report given to [place] from [place]

## 2024-06-14 NOTE — Anesthesia Postprocedure Evaluation (Signed)
 Anesthesia Post Note  Patient: Anthony Houston  Procedure(s) Performed: AMPUTATION BELOW KNEE (Right: Knee)  Patient location during evaluation: PACU Anesthesia Type: General Level of consciousness: awake and alert Pain management: pain level controlled Vital Signs Assessment: post-procedure vital signs reviewed and stable Respiratory status: spontaneous breathing, nonlabored ventilation and respiratory function stable Cardiovascular status: blood pressure returned to baseline and stable Postop Assessment: no apparent nausea or vomiting Anesthetic complications: no   No notable events documented.   Last Vitals:  Vitals:   06/14/24 1400 06/14/24 1433  BP: 133/64 137/69  Pulse: (!) 58 (!) 59  Resp: (!) 23 20  Temp:  36.6 C  SpO2: 98% 98%    Last Pain:  Vitals:   06/14/24 1433  TempSrc: Oral  PainSc: 0-No pain                 Camellia Merilee Louder

## 2024-06-14 NOTE — Interval H&P Note (Signed)
 History and Physical Interval Note:  06/14/2024 10:41 AM  Anthony Houston  has presented today for surgery, with the diagnosis of right foot wound.  The various methods of treatment have been discussed with the patient and family. After consideration of risks, benefits and other options for treatment, the patient has consented to  Procedure(s): AMPUTATION BELOW KNEE (Right) as a surgical intervention.  The patient's history has been reviewed, patient examined, no change in status, stable for surgery.  I have reviewed the patient's chart and labs.  Questions were answered to the patient's satisfaction.     [name]

## 2024-06-15 ENCOUNTER — Encounter: Payer: Self-pay | Admitting: Vascular Surgery

## 2024-06-15 DIAGNOSIS — Z89511 Acquired absence of right leg below knee: Secondary | ICD-10-CM

## 2024-06-15 DIAGNOSIS — L089 Local infection of the skin and subcutaneous tissue, unspecified: Secondary | ICD-10-CM | POA: Diagnosis not present

## 2024-06-15 LAB — CBC
HCT: 27.4 % — ABNORMAL LOW (ref 39.0–52.0)
Hemoglobin: 8.9 g/dL — ABNORMAL LOW (ref 13.0–17.0)
MCH: 27.7 pg (ref 26.0–34.0)
MCHC: 32.5 g/dL (ref 30.0–36.0)
MCV: 85.4 fL (ref 80.0–100.0)
Platelets: 275 K/uL (ref 150–400)
RBC: 3.21 MIL/uL — ABNORMAL LOW (ref 4.22–5.81)
RDW: 15.6 % — ABNORMAL HIGH (ref 11.5–15.5)
WBC: 10.8 K/uL — ABNORMAL HIGH (ref 4.0–10.5)
nRBC: 0 % (ref 0.0–0.2)

## 2024-06-15 LAB — BASIC METABOLIC PANEL WITH GFR
Anion gap: 16 — ABNORMAL HIGH (ref 5–15)
BUN: 43 mg/dL — ABNORMAL HIGH (ref 8–23)
CO2: 23 mmol/L (ref 22–32)
Calcium: 8.7 mg/dL — ABNORMAL LOW (ref 8.9–10.3)
Chloride: 91 mmol/L — ABNORMAL LOW (ref 98–111)
Creatinine, Ser: 7.46 mg/dL — ABNORMAL HIGH (ref 0.61–1.24)
GFR, Estimated: 7 mL/min — ABNORMAL LOW (ref >60)
Glucose, Bld: 95 mg/dL (ref 70–99)
Potassium: 5.3 mmol/L — ABNORMAL HIGH (ref 3.5–5.1)
Sodium: 130 mmol/L — ABNORMAL LOW (ref 135–145)

## 2024-06-15 LAB — PHOSPHORUS: Phosphorus: 3.1 mg/dL (ref 2.5–4.6)

## 2024-06-15 LAB — GLUCOSE, CAPILLARY: Glucose-Capillary: 94 mg/dL (ref 70–99)

## 2024-06-15 LAB — MAGNESIUM: Magnesium: 2.2 mg/dL (ref 1.7–2.4)

## 2024-06-15 MED ORDER — LIDOCAINE-PRILOCAINE 2.5-2.5 % EX CREA: 1.0000 | TOPICAL_CREAM | CUTANEOUS | Status: DC | PRN

## 2024-06-15 MED ORDER — HEPARIN SODIUM (PORCINE) 1000 UNIT/ML DIALYSIS: 1000.0000 [IU] | INTRAMUSCULAR | Status: DC | PRN

## 2024-06-15 MED ORDER — PENTAFLUOROPROP-TETRAFLUOROETH EX AERO: 1.0000 | INHALATION_SPRAY | CUTANEOUS | Status: DC | PRN

## 2024-06-15 MED ORDER — VITAMIN B-12 1000 MCG PO TABS
1000.0000 ug | ORAL_TABLET | Freq: Every day | ORAL | Status: DC
  Administered 2024-06-16 – 2024-06-19 (×3): 1000 ug via ORAL
  Filled 2024-06-15 (×3): qty 1

## 2024-06-15 MED ORDER — CALCITRIOL 0.25 MCG PO CAPS
0.2500 ug | ORAL_CAPSULE | Freq: Every day | ORAL | Status: DC
  Administered 2024-06-16 – 2024-06-19 (×3): 0.25 ug via ORAL
  Filled 2024-06-15 (×3): qty 1

## 2024-06-15 MED ORDER — EPOETIN ALFA-EPBX 4000 UNIT/ML IJ SOLN
INTRAMUSCULAR | Status: AC
  Filled 2024-06-15: qty 1

## 2024-06-15 NOTE — Plan of Care (Signed)
   Problem: Education: Goal: Knowledge of General Education information will improve Description Including pain rating scale, medication(s)/side effects and non-pharmacologic comfort measures Outcome: Progressing

## 2024-06-15 NOTE — TOC Initial Note (Signed)
 Transition of Care [facility]) - Initial/Assessment Note    Patient Details  Name: Anthony Houston MRN: [id number] Date of Birth: 1950-08-30  Transition of Care [facility]) CM/SW Contact:    Anthony JONETTA Hamilton, Anthony Houston Phone Number: 06/15/2024, 6:02 PM  Clinical Narrative:                  TOC reviewed chart, patient s/p RT BKA POD 1. Patient from [facility], patient is Ox1 to person.  Patient's brother Belynda Ellen is POA and requests updates on patient disposition.  Per TOC chart review, care team anticipates patient to be medically ready to discharge to SNF after first dressing change on 12/06. This CM requested orders for PT/OT Evals.   TOC will continue to follow.    Expected Discharge Plan: Skilled Nursing Facility Barriers to Discharge: Continued Medical Work up   Patient Goals and CMS Choice            Expected Discharge Plan and Services                                              Prior Living Arrangements/Services                       Activities of Daily Living   ADL Screening (condition at time of admission) Independently performs ADLs?: Yes (appropriate for developmental age) Is the patient deaf or have difficulty hearing?: No Does the patient have difficulty seeing, even when wearing glasses/contacts?: No Does the patient have difficulty concentrating, remembering, or making decisions?: No  Permission Sought/Granted                  Emotional Assessment       Orientation: : Oriented to Self Alcohol / Substance Use: Not Applicable Psych Involvement: No (comment)  Admission diagnosis:  Foot ulcer (HCC) [L97.509] Right foot infection [L08.9] Patient Active Problem List   Diagnosis Date Noted   S/P BKA (below knee amputation) unilateral, right (HCC) 06/15/2024   Hypophosphatemia 06/08/2024   Right foot infection with wound 06/06/2024   Obesity (BMI 30-39.9) 06/06/2024   Anemia in ESRD (end-stage renal disease) (HCC)  06/06/2024   Diarrhea 06/06/2024   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) 06/06/2024   Acute metabolic encephalopathy 07/06/2023   Sepsis (HCC) 07/06/2023   Vomiting 07/06/2023   Acute respiratory failure with hypoxia (HCC) 12/17/2022   Tobacco abuse counseling 12/17/2022   Dementia (HCC) 12/17/2022   Chronic hepatitis B (HCC) 12/17/2022   Open wound of left foot 12/17/2022   Hypertension 07/26/2018   CAD (coronary artery disease) 07/26/2018   Weakness 07/26/2018   ESRD on hemodialysis (HCC) 07/26/2018   PCP:  Freddrick No Pharmacy:   [facility] [address], [place] - [address] [place] [address] Phone: [phone] Fax: [phone]     Social Drivers of Health (SDOH) Social History: SDOH Screenings   Food Insecurity: Patient Unable To Answer (06/06/2024)  Housing: Unknown (06/06/2024)  Transportation Needs: Patient Unable To Answer (06/06/2024)  Utilities: Patient Unable To Answer (06/06/2024)  Social Connections: Patient Unable To Answer (06/06/2024)  Tobacco Use: Low Risk  (06/14/2024)   SDOH Interventions:     Readmission Risk Interventions     No data to display

## 2024-06-15 NOTE — Progress Notes (Signed)
 Progress Note    06/15/2024 12:10 PM 1 Day Post-Op  Subjective:  Anthony Houston is a 73 yo male now POD #1 From:  PROCEDURE: Right below-the-knee amputation   PRE-OPERATIVE DIAGNOSIS: Right foot ulceration and infection, nonsalvageable right foot, PAD   POST-OPERATIVE DIAGNOSIS: same as above   SURGEON: Selinda Gu, MD   ASSISTANT(S): none   ANESTHESIA: general   ESTIMATED BLOOD LOSS: 150 cc   FINDING(S): none   SPECIMEN(S):  Right below-the-knee amputation    Date of Surgery: 06/07/2024  POD #8   Surgeon(s):DEW,JASON     Assistants:none   Pre-operative Diagnosis: PAD with ulceration right lower extremity   Post-operative diagnosis:  Same   Procedure(s) Performed:             1.  Ultrasound guidance for vascular access left femoral artery             2.  Catheter placement into right common femoral artery from left femoral approach             3.  Aortogram and selective right lower extremity angiogram             4.  Percutaneous transluminal angioplasty of right peroneal artery with 2 inflations with a 3 mm diameter by 22 cm length angioplasty balloon             5.  Percutaneous transluminal angioplasty of the right distal SFA/proximal popliteal artery at Hunter's canal with 7 mm diameter by 10 cm length Lutonix drug-coated angioplasty balloon             6.  Stent placement to the right distal SFA/proximal popliteal artery at Hunter's canal with 8 mm diameter by 5 cm length Viabahn stent             7.  StarClose closure device left femoral artery    Patient is resting comfortably in bed this morning.  Dressing is clean dry and intact.  Right lower extremity.  No complications to note vitals all remained stable.   Vitals:   06/15/24 0419 06/15/24 0725  BP: 119/62 127/62  Pulse: 70 67  Resp: 17 17  Temp: 98.6 F (37 C) 98.1 F (36.7 C)  SpO2: 99% 100%   Physical Exam: Cardiac:  RRR, normal S1 and S2.  No murmurs appreciated. Lungs: Nonlabored  breathing.  Clear throughout on auscultation.  No rales rhonchi or wheezing noted. Incisions: Left groin puncture incision with dressing clean dry and intact.  No hematoma seroma no. Extremities: All extremities warm to touch with exception to right lower extremity.  Right lower extremity with Doppler pulses only in DP and PT but very weak.  Noted open sore ulcer to right foot.  Covered with bandage. Abdomen: Positive bowel sounds throughout, soft, nontender nondistended. Neurologic: Alert and oriented to self only.  Patient has a history of dementia with confusion.  CBC    Component Value Date/Time   WBC 10.8 (H) 06/15/2024 0755   RBC 3.21 (L) 06/15/2024 0755   HGB 8.9 (L) 06/15/2024 0755   HGB 13.9 04/01/2012 0508   HCT 27.4 (L) 06/15/2024 0755   HCT 41.2 04/01/2012 0508   PLT 275 06/15/2024 0755   PLT 235 04/01/2012 0508   MCV 85.4 06/15/2024 0755   MCV 84 04/01/2012 0508   MCH 27.7 06/15/2024 0755   MCHC 32.5 06/15/2024 0755   RDW 15.6 (H) 06/15/2024 0755   RDW 13.9 04/01/2012 0508   LYMPHSABS 1.6 06/12/2024 0800   LYMPHSABS  1.9 04/01/2012 0508   MONOABS 0.7 06/12/2024 0800   MONOABS 0.7 04/01/2012 0508   EOSABS 0.5 06/12/2024 0800   EOSABS 0.1 04/01/2012 0508   BASOSABS 0.0 06/12/2024 0800   BASOSABS 0.0 04/01/2012 0508    BMET    Component Value Date/Time   NA 130 (L) 06/15/2024 0755   NA 141 04/02/2012 0955   K 5.3 (H) 06/15/2024 0755   K 3.8 04/02/2012 0955   CL 91 (L) 06/15/2024 0755   CL 105 04/02/2012 0955   CO2 23 06/15/2024 0755   CO2 24 04/02/2012 0955   GLUCOSE 95 06/15/2024 0755   GLUCOSE 161 (H) 04/02/2012 0955   BUN 43 (H) 06/15/2024 0755   BUN 14 04/02/2012 0955   CREATININE 7.46 (H) 06/15/2024 0755   CREATININE 1.65 (H) 04/02/2012 0955   CALCIUM  8.7 (L) 06/15/2024 0755   CALCIUM  9.3 04/02/2012 0955   GFRNONAA 7 (L) 06/15/2024 0755   GFRNONAA 44 (L) 04/02/2012 0955   GFRAA 9 (L) 05/04/2019 2344   GFRAA 50 (L) 04/02/2012 0955    INR     Component Value Date/Time   INR 1.1 06/05/2024 2148     Intake/Output Summary (Last 24 hours) at 06/15/2024 1210 Last data filed at 06/15/2024 0900 Gross per 24 hour  Intake 289.73 ml  Output 150 ml  Net 139.73 ml     Assessment/Plan:  73 y.o. male is s/p SEE ABOVE  1 Day Post-Op   PLAN First dressing change by vascular surgery on Saturday 06/17/2024.  If no complications patient can be discharged to skilled nursing facility. Patient's brother Belynda Ellen who lives in [place]  and would like to be notified of the patient's condition and placement once plans have been arranged.  DVT prophylaxis: Heparin  5000 units SQ every 8 hours.   Gwendlyn JONELLE [facility] 06/15/2024 12:10 PM

## 2024-06-15 NOTE — Progress Notes (Signed)
 Progress Note   Patient: Anthony Houston FMW:[id number] DOB: 1951-07-08 DOA: 06/05/2024     9 DOS: the patient was seen and examined on 06/13/2024   Brief hospital course: ROGER KETTLES is a 73 y.o. male with medical history significant of HTN, CAD, GERD, dementia, ESRD-HD (TTS), GIST, HBV, who presents with foot wound and foot pain.  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  Patient had angioplasty and drug-eluting stent placed on 11/16.  Continued on antibiotics.   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 S/p BKA done on 12/3 # Peripheral arterial disease. S/p Angioplasty and stent  MRI showing findings suggestive of acute osteomyelitis of the navicular, middle and medial cuneiform bones.  Continue empiric antibiotics with vancomycin  and Zosyn  Discussed with vascular surgery, patient had angioplasty and drug-eluting stent placed. Wound culture grew Pseudomonas, Klebsiella as well as Enterobacter. Vascular surgery Rec BKA.  Vancomycin  discontinued.  Continue antibiotics with Zosyn . 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 3 times daily scheduled, Dilaudid  p.o. as needed and Dilaudid  IV for breakthrough pain Gabapentin 200 mg p.o. nightly First dressing changed by vascular surgery will be done on Saturday 12/6, can be discharged to SNF if no complications.   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 # Anemia of end-stage renal disease. Continue dialysis per nephrology. Received sodium phosphate   11/27.   Hb  8.9, dropped, postop   # Hyperkalemia 12/3 Lokelma x 1 dose given Monitor chemistry daily 12/4 scheduled for hemodialysis today   # Hypertension: Continue Coreg  and as needed hydralazine    # CAD (coronary artery disease): Continue Zocor  and aspirin    # Diarrhea: Condition seem to be improved, not able to collect stool   # Dementia: No behavior disturbance -Donepezil  and Namenda   # Folic acid deficiency, folate level 4.3, started folic acid 1 mg p.o. daily.  Follow with PCP to repeat folic acid level in between 3 to 6 months. # Vitamin B12 level 319, goal >400.  Started vitamin B12 oral supplement.  Follow with PCP to repeat B12 level in between 3 to 6 months.  # Vitamin D level 33, at lower end, started Calcitrol 0.25 mg p.o. daily to prevent deficiency.  F/u with PCP to Repeat vitamin D level in between 3 to 6 months.  Body mass index is 26.29 kg/m.   Subjective:  No significant overnight event.  Patient was seen after right BKA, stated that pain is 8/10, resting comfortably, no acute distress noticed. Patient did not offer any other complaints. Patient is awake and alert, oriented x 2  Physical Exam: Vitals:   06/15/24 1330 06/15/24 1400 06/15/24 1407 06/15/24 1430  BP: (!) 92/59 (!) 74/48 (!) 83/56 109/62  Pulse: 68 67 61 65  Resp: 16 14 12 14   Temp:      TempSrc:      SpO2: 99% 98% 98% 100%  Weight:      Height:       General exam: Appears calm and comfortable  Respiratory  system: Clear to auscultation. Respiratory effort normal. Cardiovascular system: S1 & S2 heard, RRR. No JVD, murmurs, rubs, gallops or clicks. No pedal edema. Gastrointestinal system: BS +, Abdomen is nondistended, soft and nontender. Central nervous system: Alert and oriented x2. No focal neurological deficits. Extremities: s/p Right BKA, dressing CDI Skin: No rashes, lesions or ulcers Psychiatry: Judgement and insight appear normal. Mood & affect appropriate.    Data  Reviewed:  Lab results reviewed  Family Communication: None  Disposition: Status is: Inpatient Remains inpatient appropriate because: severity of disease, iv antibiotics and s/p right BKA done on 12/3, first dressing will be changed on 12/6 by vascular surgery and then patient can be discharged to SNF Follow-up TOC for SNF placement     Time spent: 35 minutes  Author: Elvan Sor, MD 06/15/2024 2:44 PM  For on call review [email or web address].

## 2024-06-15 NOTE — Progress Notes (Signed)
 [facility]  ROUNDING NOTE   Subjective:   Anthony Houston is a 73 year old male with past medical conditions including hypertension, GERD, dementia, CAD, and end-stage renal disease on hemodialysis.  Patient presents to the emergency department for evaluation of foot wound and pain and has been admitted for Foot ulcer (HCC) [L97.509] Right foot infection [L08.9]  Patient is known to our practice and receives outpatient dialysis treatments at [facility] on a TTS schedule, supervised by Dr. Dennise.    Update:  Patient seen and evaluated during dialysis   HEMODIALYSIS FLOWSHEET:  Blood Flow Rate (mL/min): 400 mL/min Arterial Pressure (mmHg): -212.31 mmHg Venous Pressure (mmHg): 222.82 mmHg TMP (mmHg): -0.8 mmHg Ultrafiltration Rate (mL/min): 639 mL/min Dialysate Flow Rate (mL/min): 300 ml/min  Denies pain  Objective:  Vital signs in last 24 hours:  Temp:  [96.6 F (35.9 C)-98.6 F (37 C)] 98.1 F (36.7 C) (12/04 0725) Pulse Rate:  [54-70] 67 (12/04 0725) Resp:  [17-25] 17 (12/04 0725) BP: (99-143)/(55-75) 127/62 (12/04 0725) SpO2:  [98 %-100 %] 100 % (12/04 0725)  Weight change:  Filed Weights   06/10/24 1055 06/13/24 0732 06/13/24 1128  Weight: 93.3 kg 91.9 kg 90.4 kg    Intake/Output: I/O last 3 completed shifts: In: 1120.4 [I.V.:115.8; IV Piggyback:1004.6] Out: 350 [Urine:200; Blood:150]   Intake/Output this shift:  Total I/O In: 120 [P.O.:120] Out: -   Physical Exam: General: NAD  Head: Normocephalic, atraumatic. Moist oral mucosal membranes  Eyes: Anicteric  Lungs:  Clear to auscultation, normal effort  Heart: Regular rate and rhythm  Abdomen:  Soft, nontender  Extremities: Trace peripheral edema.  Neurologic: Awake, alert  Skin: Warm,dry, Right lower extremity wound dressings  Access: Lt AVF    Basic Metabolic Panel: Recent Labs  Lab 06/09/24 1237 06/13/24 0740 06/14/24 1138 06/14/24 1448 06/15/24 0755  NA 136  133* 132*  --  130*  K 4.3 5.0 5.0 5.4* 5.3*  CL 97* 93* 96*  --  91*  CO2 27 25  --   --  23  GLUCOSE 125* 81 82  --  95  BUN 33* 51* 33*  --  43*  CREATININE 7.33* 8.37* 6.80*  --  7.46*  CALCIUM  8.3* 8.5*  --   --  8.7*  MG  --   --   --   --  2.2  PHOS 3.9 3.4  --   --  3.1    Liver Function Tests: Recent Labs  Lab 06/09/24 1237 06/13/24 0740  ALBUMIN 2.9* 3.0*   No results for input(s): LIPASE, AMYLASE in the last 168 hours. No results for input(s): AMMONIA in the last 168 hours.  CBC: Recent Labs  Lab 06/09/24 1237 06/12/24 0800 06/13/24 0740 06/14/24 1138 06/15/24 0755  WBC 5.9 6.3 7.0  --  10.8*  NEUTROABS 3.3 3.5  --   --   --   HGB 8.4* 9.3* 8.8* 10.9* 8.9*  HCT 26.3* 28.4* 26.8* 32.0* 27.4*  MCV 87.1 85.0 84.8  --  85.4  PLT 260 273 260  --  275    Cardiac Enzymes: No results for input(s): CKTOTAL, CKMB, CKMBINDEX, TROPONINI in the last 168 hours.  BNP: Invalid input(s): POCBNP  CBG: Recent Labs  Lab 06/09/24 1002 06/10/24 0715 06/11/24 0722 06/14/24 0815 06/15/24 0726  GLUCAP 92 115* 90 101* 94    Microbiology: Results for orders placed or performed during the hospital encounter of 06/05/24  Culture, blood (single) w Reflex to ID Panel  Status: None   Collection Time: 06/05/24  4:58 PM   Specimen: BLOOD  Result Value Ref Range Status   Specimen Description BLOOD LEFT ANTECUBITAL  Final   Special Requests   Final    BOTTLES DRAWN AEROBIC AND ANAEROBIC Blood Culture adequate volume   Culture   Final    NO GROWTH 5 DAYS Performed at [facility], [address] [place], [place] [address]    Report Status 06/11/2024 FINAL  Final  Culture, blood (Routine X 2) w Reflex to ID Panel     Status: None   Collection Time: 06/05/24  9:39 PM   Specimen: BLOOD  Result Value Ref Range Status   Specimen Description BLOOD BLOOD RIGHT ARM  Final   Special Requests   Final    BOTTLES DRAWN AEROBIC AND ANAEROBIC Blood  Culture adequate volume   Culture   Final    NO GROWTH 5 DAYS Performed at [facility], [address] [place], [place] [address]    Report Status 06/11/2024 FINAL  Final  Aerobic Culture w Gram Stain (superficial specimen)     Status: None   Collection Time: 06/05/24  9:51 PM   Specimen: Wound  Result Value Ref Range Status   Specimen Description   Final    WOUND Performed at [facility], [address], [place], [place] [address]    Special Requests   Final    NONE Performed at [facility], [address] [place], [place] [address]    Gram Stain   Final    RARE WBC PRESENT, PREDOMINANTLY PMN RARE GRAM NEGATIVE RODS RARE GRAM POSITIVE COCCI Performed at [facility], [address] [place], [place] [address]    Culture   Final    ABUNDANT PSEUDOMONAS AERUGINOSA ABUNDANT ENTEROBACTER CLOACAE MODERATE KLEBSIELLA PNEUMONIAE    Report Status 06/10/2024 FINAL  Final   Organism ID, Bacteria ENTEROBACTER CLOACAE  Final   Organism ID, Bacteria KLEBSIELLA PNEUMONIAE  Final   Organism ID, Bacteria PSEUDOMONAS AERUGINOSA  Final      Susceptibility   Enterobacter cloacae - MIC*    CEFEPIME  <=0.12 SENSITIVE Sensitive     ERTAPENEM <=0.12 SENSITIVE Sensitive     CIPROFLOXACIN <=0.06 SENSITIVE Sensitive     GENTAMICIN <=1 SENSITIVE Sensitive     MEROPENEM <=0.25 SENSITIVE Sensitive     TRIMETH/SULFA <=20 SENSITIVE Sensitive     PIP/TAZO Value in next row Sensitive      <=4 SENSITIVEThis is a modified FDA-approved test that has been validated and its performance characteristics determined by the reporting laboratory.  This laboratory is certified under the Clinical Laboratory Improvement Amendments CLIA as qualified to perform high complexity clinical laboratory testing.    * ABUNDANT ENTEROBACTER CLOACAE   Klebsiella pneumoniae - MIC*    AMPICILLIN Value in next row Resistant      <=4 SENSITIVEThis is a modified FDA-approved test that has been  validated and its performance characteristics determined by the reporting laboratory.  This laboratory is certified under the Clinical Laboratory Improvement Amendments CLIA as qualified to perform high complexity clinical laboratory testing.    CEFAZOLIN  (NON-URINE) Value in next row Sensitive      <=4 SENSITIVEThis is a modified FDA-approved test that has been validated and its performance characteristics determined by the reporting laboratory.  This laboratory is certified under the Clinical Laboratory Improvement Amendments CLIA as qualified to perform high complexity clinical laboratory testing.    CEFEPIME  Value in next row  Sensitive      <=4 SENSITIVEThis is a modified FDA-approved test that has been validated and its performance characteristics determined by the reporting laboratory.  This laboratory is certified under the Clinical Laboratory Improvement Amendments CLIA as qualified to perform high complexity clinical laboratory testing.    ERTAPENEM Value in next row Sensitive      <=4 SENSITIVEThis is a modified FDA-approved test that has been validated and its performance characteristics determined by the reporting laboratory.  This laboratory is certified under the Clinical Laboratory Improvement Amendments CLIA as qualified to perform high complexity clinical laboratory testing.    CEFTRIAXONE  Value in next row Sensitive      <=4 SENSITIVEThis is a modified FDA-approved test that has been validated and its performance characteristics determined by the reporting laboratory.  This laboratory is certified under the Clinical Laboratory Improvement Amendments CLIA as qualified to perform high complexity clinical laboratory testing.    CIPROFLOXACIN Value in next row Sensitive      <=4 SENSITIVEThis is a modified FDA-approved test that has been validated and its performance characteristics determined by the reporting laboratory.  This laboratory is certified under the Clinical Laboratory Improvement  Amendments CLIA as qualified to perform high complexity clinical laboratory testing.    GENTAMICIN Value in next row Sensitive      <=4 SENSITIVEThis is a modified FDA-approved test that has been validated and its performance characteristics determined by the reporting laboratory.  This laboratory is certified under the Clinical Laboratory Improvement Amendments CLIA as qualified to perform high complexity clinical laboratory testing.    MEROPENEM Value in next row Sensitive      <=4 SENSITIVEThis is a modified FDA-approved test that has been validated and its performance characteristics determined by the reporting laboratory.  This laboratory is certified under the Clinical Laboratory Improvement Amendments CLIA as qualified to perform high complexity clinical laboratory testing.    TRIMETH/SULFA Value in next row Sensitive      <=4 SENSITIVEThis is a modified FDA-approved test that has been validated and its performance characteristics determined by the reporting laboratory.  This laboratory is certified under the Clinical Laboratory Improvement Amendments CLIA as qualified to perform high complexity clinical laboratory testing.    AMPICILLIN/SULBACTAM Value in next row Sensitive      <=4 SENSITIVEThis is a modified FDA-approved test that has been validated and its performance characteristics determined by the reporting laboratory.  This laboratory is certified under the Clinical Laboratory Improvement Amendments CLIA as qualified to perform high complexity clinical laboratory testing.    PIP/TAZO Value in next row Sensitive      <=4 SENSITIVEThis is a modified FDA-approved test that has been validated and its performance characteristics determined by the reporting laboratory.  This laboratory is certified under the Clinical Laboratory Improvement Amendments CLIA as qualified to perform high complexity clinical laboratory testing.    * MODERATE KLEBSIELLA PNEUMONIAE   Pseudomonas aeruginosa - MIC*     MEROPENEM Value in next row Sensitive      <=4 SENSITIVEThis is a modified FDA-approved test that has been validated and its performance characteristics determined by the reporting laboratory.  This laboratory is certified under the Clinical Laboratory Improvement Amendments CLIA as qualified to perform high complexity clinical laboratory testing.    CIPROFLOXACIN Value in next row Sensitive      <=4 SENSITIVEThis is a modified FDA-approved test that has been validated and its performance characteristics determined by the reporting laboratory.  This laboratory is certified under the Clinical Laboratory  Improvement Amendments CLIA as qualified to perform high complexity clinical laboratory testing.    IMIPENEM Value in next row Sensitive      <=4 SENSITIVEThis is a modified FDA-approved test that has been validated and its performance characteristics determined by the reporting laboratory.  This laboratory is certified under the Clinical Laboratory Improvement Amendments CLIA as qualified to perform high complexity clinical laboratory testing.    PIP/TAZO Value in next row Sensitive      16 SENSITIVEThis is a modified FDA-approved test that has been validated and its performance characteristics determined by the reporting laboratory.  This laboratory is certified under the Clinical Laboratory Improvement Amendments CLIA as qualified to perform high complexity clinical laboratory testing.    CEFEPIME  Value in next row Sensitive      16 SENSITIVEThis is a modified FDA-approved test that has been validated and its performance characteristics determined by the reporting laboratory.  This laboratory is certified under the Clinical Laboratory Improvement Amendments CLIA as qualified to perform high complexity clinical laboratory testing.    CEFTAZIDIME/AVIBACTAM Value in next row Sensitive      16 SENSITIVEThis is a modified FDA-approved test that has been validated and its performance characteristics determined  by the reporting laboratory.  This laboratory is certified under the Clinical Laboratory Improvement Amendments CLIA as qualified to perform high complexity clinical laboratory testing.    CEFTOLOZANE/TAZOBACTAM Value in next row Sensitive      16 SENSITIVEThis is a modified FDA-approved test that has been validated and its performance characteristics determined by the reporting laboratory.  This laboratory is certified under the Clinical Laboratory Improvement Amendments CLIA as qualified to perform high complexity clinical laboratory testing.    TOBRAMYCIN Value in next row Sensitive      16 SENSITIVEThis is a modified FDA-approved test that has been validated and its performance characteristics determined by the reporting laboratory.  This laboratory is certified under the Clinical Laboratory Improvement Amendments CLIA as qualified to perform high complexity clinical laboratory testing.    CEFTAZIDIME Value in next row Sensitive      16 SENSITIVEThis is a modified FDA-approved test that has been validated and its performance characteristics determined by the reporting laboratory.  This laboratory is certified under the Clinical Laboratory Improvement Amendments CLIA as qualified to perform high complexity clinical laboratory testing.    * ABUNDANT PSEUDOMONAS AERUGINOSA  Culture, blood (Routine X 2) w Reflex to ID Panel     Status: None   Collection Time: 06/05/24 11:25 PM   Specimen: BLOOD  Result Value Ref Range Status   Specimen Description BLOOD RIGHT ANTECUBITAL  Final   Special Requests   Final    BOTTLES DRAWN AEROBIC AND ANAEROBIC Blood Culture adequate volume   Culture   Final    NO GROWTH 5 DAYS Performed at [facility], [address] [place], [place] [address]    Report Status 06/10/2024 FINAL  Final  MRSA Next Gen by PCR, Nasal     Status: None   Collection Time: 06/09/24  8:11 AM   Specimen: Nasal Mucosa; Nasal Swab  Result Value Ref Range Status   MRSA by PCR  Next Gen NOT DETECTED NOT DETECTED Final    Comment: (NOTE) The GeneXpert MRSA Assay (FDA approved for NASAL specimens only), is one component of a comprehensive MRSA colonization surveillance program. It is not intended to diagnose MRSA infection nor to guide or monitor treatment for MRSA infections. Test performance is not FDA approved in patients less than  39 years old. Performed at [facility], [address] [place], [place] [address]     Coagulation Studies: No results for input(s): LABPROT, INR in the last 72 hours.   Urinalysis: No results for input(s): COLORURINE, LABSPEC, PHURINE, GLUCOSEU, HGBUR, BILIRUBINUR, KETONESUR, PROTEINUR, UROBILINOGEN, NITRITE, LEUKOCYTESUR in the last 72 hours.  Invalid input(s): APPERANCEUR    Imaging: No results found.    Medications:    piperacillin -tazobactam 2.25 g (06/15/24 0635)    acetaminophen   650 mg Oral TID   aspirin   81 mg Oral Daily   brimonidine   1 drop Both Eyes BID   And   timolol   1 drop Both Eyes BID   calcitRIOL   0.25 mcg Oral Daily   Chlorhexidine  Gluconate Cloth  6 each Topical Q0600   clopidogrel   75 mg Oral Daily   collagenase    Topical Daily   vitamin B-12  1,000 mcg Oral Daily   donepezil   10 mg Oral QHS   entecavir   0.5 mg Oral Q72H   epoetin  alfa-epbx (RETACRIT ) injection  4,000 Units Intravenous Q T,Th,Sat-1800   folic acid   1 mg Oral Daily   gabapentin   200 mg Oral QHS   heparin   5,000 Units Subcutaneous Q8H   latanoprost   1 drop Both Eyes QHS   memantine   5 mg Oral BID   senna-docusate  2 tablet Oral BID   simvastatin   10 mg Oral QHS   heparin , hydrALAZINE , HYDROmorphone  (DILAUDID ) injection, HYDROmorphone , lidocaine -prilocaine , ondansetron  (ZOFRAN ) IV, pentafluoroprop-tetrafluoroeth, polyethylene glycol  Assessment/ Plan:  [name] is a 73 y.o.  male with past medical conditions including hypertension, GERD, dementia, CAD, and end-stage  renal disease on hemodialysis.  Patient presents to the emergency department for evaluation of foot wound and pain and has been admitted for Foot ulcer (HCC) [L97.509] Right foot infection [L08.9]   End stage renal disease on hemodialysis.  Receiving dialysis later today, UF 1.5-2L.    2. Anemia of chronic kidney disease Lab Results  Component Value Date   HGB 8.9 (L) 06/15/2024  Hgb borderline, continue low dose ESA   3. Secondary Hyperparathyroidism: with outpatient labs:none available  Lab Results  Component Value Date   PTH 101 (H) 07/26/2018   CALCIUM  8.7 (L) 06/15/2024   CAION 1.10 (L) 06/14/2024   PHOS 3.1 06/15/2024    Will continue to monitor bone minerals during this admission  4. Hypotension with chronic kidney disease. Prescribed Midodrine outpatient. Currently held   LOS: 9 Donevin Sainsbury 12/4/202512:13 PM

## 2024-06-15 NOTE — Progress Notes (Signed)
   06/15/24 1600  Vitals  Temp (!) 97.2 F (36.2 C)  BP 112/63  MAP (mmHg) 80  Pulse Rate 69  ECG Heart Rate 69  Resp (!) 22  Oxygen Therapy  SpO2 100 %  O2 Device Room Air  Patient Activity (if Appropriate) In bed  Pulse Oximetry Type Continuous  Oximetry Probe Site Changed No  During Treatment Monitoring  Blood Flow Rate (mL/min) 0 mL/min  Arterial Pressure (mmHg) 0 mmHg  Venous Pressure (mmHg) 0 mmHg  TMP (mmHg) 8.48 mmHg  Ultrafiltration Rate (mL/min) 629 mL/min  Dialysate Flow Rate (mL/min) 300 ml/min  Dialysate Potassium Concentration 2  Dialysate Calcium  Concentration 2.5  Duration of HD Treatment -hour(s) 3.5 hour(s)  Cumulative Fluid Removed (mL) per Treatment  1399.47  Post Treatment  Dialyzer Clearance Clear  Hemodialysis Intake (mL) 0 mL  Liters Processed 74  Fluid Removed (mL) 1400 mL  Tolerated HD Treatment Yes  Post-Hemodialysis Comments 1400 removed. Tx tolerated. VS are stable. No verbalized concerns. Patient is stable. No verbalized concerns. Stable to discharge for assigned floor. Report given  AVG/AVF Arterial Site Held (minutes) 5 minutes  AVG/AVF Venous Site Held (minutes) 5 minutes

## 2024-06-16 DIAGNOSIS — L089 Local infection of the skin and subcutaneous tissue, unspecified: Secondary | ICD-10-CM | POA: Diagnosis not present

## 2024-06-16 LAB — BASIC METABOLIC PANEL WITH GFR
Anion gap: 12 (ref 5–15)
BUN: 27 mg/dL — ABNORMAL HIGH (ref 8–23)
CO2: 27 mmol/L (ref 22–32)
Calcium: 8.4 mg/dL — ABNORMAL LOW (ref 8.9–10.3)
Chloride: 93 mmol/L — ABNORMAL LOW (ref 98–111)
Creatinine, Ser: 5.47 mg/dL — ABNORMAL HIGH (ref 0.61–1.24)
GFR, Estimated: 10 mL/min — ABNORMAL LOW (ref >60)
Glucose, Bld: 97 mg/dL (ref 70–99)
Potassium: 4.8 mmol/L (ref 3.5–5.1)
Sodium: 132 mmol/L — ABNORMAL LOW (ref 135–145)

## 2024-06-16 LAB — SURGICAL PATHOLOGY

## 2024-06-16 LAB — CBC
HCT: 24.9 % — ABNORMAL LOW (ref 39.0–52.0)
Hemoglobin: 8 g/dL — ABNORMAL LOW (ref 13.0–17.0)
MCH: 27.6 pg (ref 26.0–34.0)
MCHC: 32.1 g/dL (ref 30.0–36.0)
MCV: 85.9 fL (ref 80.0–100.0)
Platelets: 301 K/uL (ref 150–400)
RBC: 2.9 MIL/uL — ABNORMAL LOW (ref 4.22–5.81)
RDW: 15.8 % — ABNORMAL HIGH (ref 11.5–15.5)
WBC: 8.2 K/uL (ref 4.0–10.5)
nRBC: 0 % (ref 0.0–0.2)

## 2024-06-16 LAB — PHOSPHORUS: Phosphorus: 2.9 mg/dL (ref 2.5–4.6)

## 2024-06-16 LAB — GLUCOSE, CAPILLARY
Glucose-Capillary: 94 mg/dL (ref 70–99)
Glucose-Capillary: 91 mg/dL (ref 70–99)

## 2024-06-16 LAB — MAGNESIUM: Magnesium: 2 mg/dL (ref 1.7–2.4)

## 2024-06-16 NOTE — Progress Notes (Signed)
 Progress Note   Patient: Anthony Houston FMW:[id number] DOB: 08-Jun-1951 DOA: 06/05/2024     10 DOS: the patient was seen and examined on 06/13/2024   Brief hospital course: RMANI KELLOGG is a 73 y.o. male with medical history significant of HTN, CAD, GERD, dementia, ESRD-HD (TTS), GIST, HBV, who presents with foot wound and foot pain.  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  Patient had angioplasty and drug-eluting stent placed on 11/16.  Continued on antibiotics.   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 S/p BKA done on 12/3 # Peripheral arterial disease. S/p Angioplasty and stent  MRI showing findings suggestive of acute osteomyelitis of the navicular, middle and medial cuneiform bones.  Continue empiric antibiotics with vancomycin  and Zosyn  Discussed with vascular surgery, patient had angioplasty and drug-eluting stent placed. Wound culture grew Pseudomonas, Klebsiella as well as Enterobacter. Vascular surgery Rec BKA.  Vancomycin  discontinued.  Continue antibiotics with Zosyn . 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 3 times daily scheduled, Dilaudid  p.o. as needed and Dilaudid  IV for breakthrough pain Gabapentin  200 mg p.o. nightly First dressing changed by vascular surgery will be done on Saturday 12/6, can be discharged to SNF if no complications.   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 # Anemia of end-stage renal disease. Continue dialysis per nephrology. Received sodium phosphate   11/27.   Hb  8.9, dropped, postop   # Hyperkalemia 12/3 Lokelma  x 1 dose given Monitor chemistry daily 12/4 scheduled for hemodialysis today   # Hypertension: Continue Coreg  and as needed hydralazine    # CAD (coronary artery disease): Continue Zocor  and aspirin    # Diarrhea: Condition seem to be improved, not able to collect stool   # Dementia: No behavior disturbance -Donepezil  and Namenda   # Folic acid  deficiency, folate level 4.3, started folic acid  1 mg p.o. daily.  Follow with PCP to repeat folic acid  level in between 3 to 6 months. # Vitamin B12 level 319, goal >400.  Started vitamin B12 oral supplement.  Follow with PCP to repeat B12 level in between 3 to 6 months.  # Vitamin D  level 33, at lower end, started Calcitrol 0.25 mg p.o. daily to prevent deficiency.  F/u with PCP to Repeat vitamin D  level in between 3 to 6 months.  Body mass index is 26.29 kg/m.   Subjective:  No significant overnight event.  Patient was resting comfortably, denied any pain.   Physical Exam: Vitals:   06/15/24 1600 06/15/24 2027 06/16/24 0433 06/16/24 0808  BP: 112/63 106/65 (!) 112/51 (!) 115/55  Pulse: 69 72 73 73  Resp: (!) 22 16 16 17   Temp: (!) 97.2 F (36.2 C) 98.6 F (37 C) 98.3 F (36.8 C) 98.4 F (36.9 C)  TempSrc:    Oral  SpO2: 100% 100% 99% 99%  Weight:      Height:       General exam: Appears calm and comfortable  Respiratory system: Clear to auscultation. Respiratory effort normal. Cardiovascular system: S1 &  S2 heard, RRR. No JVD, murmurs, rubs, gallops or clicks. No pedal edema. Gastrointestinal system: BS +, Abdomen is nondistended, soft and nontender. Central nervous system: Alert and oriented x2. No focal neurological deficits. Extremities: s/p Right BKA, dressing CDI Skin: No rashes, lesions or ulcers Psychiatry: Judgement and insight appear normal. Mood & affect appropriate.    Data Reviewed:  Lab results reviewed  Family Communication:  None  Disposition: Status is: Inpatient Remains inpatient appropriate because: severity of disease, iv antibiotics and s/p right BKA done on 12/3, first dressing will be changed on 12/6 by vascular surgery and then patient can be discharged to SNF Follow-up TOC for SNF placement     Time spent: 35 minutes  Author: Elvan Sor, MD 06/16/2024 3:40 PM  For on call review [email or web address].

## 2024-06-16 NOTE — Plan of Care (Signed)
   Problem: Education: Goal: Knowledge of General Education information will improve Description Including pain rating scale, medication(s)/side effects and non-pharmacologic comfort measures Outcome: Progressing   Problem: Health Behavior/Discharge Planning: Goal: Ability to manage health-related needs will improve Outcome: Progressing   Problem: Clinical Measurements: Goal: Ability to maintain clinical measurements within normal limits will improve Outcome: Progressing Goal: Will remain free from infection Outcome: Progressing Goal: Diagnostic test results will improve Outcome: Progressing Goal: Respiratory complications will improve Outcome: Progressing Goal: Cardiovascular complication will be avoided Outcome: Progressing   Problem: Elimination: Goal: Will not experience complications related to bowel motility Outcome: Progressing Goal: Will not experience complications related to urinary retention Outcome: Progressing

## 2024-06-16 NOTE — Evaluation (Signed)
 Physical Therapy Evaluation Patient Details Name: Anthony Houston MRN: [id number] DOB: 01/22/1951 Today's Date: 06/16/2024  History of Present Illness  Anthony Houston is a 73 y.o. male with medical history significant of HTN, CAD, GERD, dementia, ESRD-HD (TTS), GIST, HBV, who presents with foot wound and foot pain. MRI suggestive of acute osteomyelitis of the navicular, middle and medial cuneiform bones. 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  Clinical Impression  Pt is a 73 y.o. 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 Pt received in supine and was agreeable to PT, no reports of pain or discomfort in the operative limb. Pt able to complete bed mobility with increased time and 2+ MOD assist to support shoulder and guide BLE on/off bedside. He can accept very mild resistance with no UE support in sitting. STEDY was used to initiate STS. Pt attempted x 3 with 2+ MAX without success. Plan to work on STS with STEDY to promote WB and strengthening of residual limb. Pt will continue to benefit from skilled rehab services to address her decline in functional strength, balance, and mobility.      If plan is discharge home, recommend the following: Two people to help with walking and/or transfers;A lot of help with walking and/or transfers;A lot of help with bathing/dressing/bathroom;Two people to help with bathing/dressing/bathroom   Can travel by private vehicle   No    Equipment Recommendations Other (comment) (TBD at next venue)  Recommendations for Other Services       Functional Status Assessment Patient has had a recent decline in their functional status and demonstrates the ability to make significant improvements in function in a reasonable and predictable amount of time.     Precautions / Restrictions Precautions Precautions: Fall Recall of Precautions/Restrictions: Impaired Restrictions Weight Bearing Restrictions Per Provider  Order: Yes Other Position/Activity Restrictions: R BKA      Mobility  Bed Mobility Overal bed mobility: Needs Assistance Bed Mobility: Supine to Sit, Sit to Supine     Supine to sit: Mod assist, HOB elevated, Used rails Sit to supine: Mod assist   General bed mobility comments: required a 2+ total scoot up in bed; able to adjust upper body for alignment    Transfers Overall transfer level: Needs assistance Equipment used: 2 person hand held assist Transfers: Sit to/from Stand Sit to Stand: Max assist, +2 physical assistance           General transfer comment: Attempted x3 but pt is unable to come up from seated surface with +2 assist and STEDY Transfer via Lift Equipment: Stedy  Ambulation/Gait               General Gait Details: NT  Stairs            Wheelchair Mobility     Tilt Bed    Modified Rankin (Stroke Patients Only)       Balance Overall balance assessment: Needs assistance Sitting-balance support: Feet supported, No upper extremity supported Sitting balance-Leahy Scale: Fair       Standing balance-Leahy Scale: Zero Standing balance comment: NT - unable                             Pertinent Vitals/Pain Pain Assessment Pain Assessment: No/denies pain    Home Living Family/patient expects to be discharged to:: Assisted living  Additional Comments: Per chart, pt from [facility]. Pt is limited historian, states he lives with his parents and grandparents.    Prior Function Prior Level of Function : Needs assist             Mobility Comments: Pt reports he uses a WC and pivot transfers to/from WC. ADLs Comments: Pt reports he is typically independent with basic needs like dressing, feeding himself, and toileting. No caregiver available to confirm.     Extremity/Trunk Assessment   Upper Extremity Assessment Upper Extremity Assessment: Overall WFL for tasks assessed    Lower  Extremity Assessment Lower Extremity Assessment: RLE deficits/detail RLE Deficits / Details: s/p R BKA    Cervical / Trunk Assessment Cervical / Trunk Assessment: Normal  Communication   Communication Communication: No apparent difficulties    Cognition Arousal: Alert Behavior During Therapy: WFL for tasks assessed/performed   PT - Cognitive impairments: No family/caregiver present to determine baseline, Difficult to assess                         Following commands: Impaired Following commands impaired: Follows one step commands with increased time, Follows multi-step commands inconsistently     Cueing Cueing Techniques: Verbal cues, Gestural cues, Visual cues     General Comments      Exercises     Assessment/Plan    PT Assessment Patient needs continued PT services  PT Problem List Decreased strength;Decreased range of motion;Decreased activity tolerance;Decreased balance;Decreased mobility;Decreased coordination;Decreased knowledge of use of DME;Decreased safety awareness       PT Treatment Interventions DME instruction;Functional mobility training;Therapeutic activities;Therapeutic exercise;Balance training;Neuromuscular re-education    PT Goals (Current goals can be found in the Care Plan section)  Acute Rehab PT Goals Patient Stated Goal: none stated PT Goal Formulation: With patient Time For Goal Achievement: 06/30/24 Potential to Achieve Goals: Fair    Frequency Min 2X/week     Co-evaluation               AM-PAC PT 6 Clicks Mobility  Outcome Measure Help needed turning from your back to your side while in a flat bed without using bedrails?: A Little Help needed moving from lying on your back to sitting on the side of a flat bed without using bedrails?: A Lot Help needed moving to and from a bed to a chair (including a wheelchair)?: Total Help needed standing up from a chair using your arms (e.g., wheelchair or bedside chair)?:  Total Help needed to walk in hospital room?: Total   6 Click Score: 8    End of Session Equipment Utilized During Treatment: Gait belt Activity Tolerance: Patient tolerated treatment well Patient left: in bed;with call bell/phone within reach;with bed alarm set Nurse Communication: Mobility status;Need for lift equipment PT Visit Diagnosis: Muscle weakness (generalized) (M62.81);Difficulty in walking, not elsewhere classified (R26.2);Other abnormalities of gait and mobility (R26.89)    Time: 1113-1130 PT Time Calculation (min) (ACUTE ONLY): 17 min   Charges:                 Allena Bulls, SPT   Allena Bulls 06/16/2024, 12:29 PM

## 2024-06-16 NOTE — Evaluation (Signed)
 Occupational Therapy Evaluation Patient Details Name: Anthony Houston MRN: [id number] DOB: 1950/11/30 Today's Date: 06/16/2024   History of Present Illness   Anthony Houston is a 73 y.o. male with medical history significant of HTN, CAD, GERD, dementia, ESRD-HD (TTS), GIST, HBV, who presents with foot wound and foot pain. MRI suggestive of acute osteomyelitis of the navicular, middle and medial cuneiform bones. 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     Clinical Impressions Anthony Houston was seen for OT evaluation this date. Prior to hospital admission, pt was residing in an ALF, no caregiver present to verify his baseline level of assistance. Pt states he is able to dress himself, feed himself and transfer to his WC independent. Cannot elaborate on how he transfers (SPT vs. Lateral scoot) at baseline.  Pt presents with deficits in strength, safety awareness, activity tolerance, and balance, affecting safe and optimal ADL completion. Pt currently requires +2 MAX A to perform functional transfers and LB ADL management from STS.  Pt would benefit from skilled OT services to address noted impairments and functional limitations (see below for any additional details) in order to maximize safety and independence while minimizing future risk of falls, injury, and readmission. Anticipate the need for follow up OT services upon acute hospital DC.      If plan is discharge home, recommend the following:   Two people to help with walking and/or transfers     Functional Status Assessment   Patient has had a recent decline in their functional status and demonstrates the ability to make significant improvements in function in a reasonable and predictable amount of time.     Equipment Recommendations   Other (comment) (defer)     Recommendations for Other Services         Precautions/Restrictions   Precautions Precautions: Fall Recall of Precautions/Restrictions:  Impaired Restrictions Weight Bearing Restrictions Per Provider Order: Yes Other Position/Activity Restrictions: R BKA     Mobility Bed Mobility Overal bed mobility: Needs Assistance Bed Mobility: Supine to Sit     Supine to sit: Mod assist, HOB elevated, Used rails          Transfers Overall transfer level: Needs assistance   Transfers: Sit to/from Stand Sit to Stand: Max assist, +2 physical assistance           General transfer comment: Attempted x3 but pt is unable to come up from seated surface with +1 assist.      Balance Overall balance assessment: Needs assistance Sitting-balance support: Feet supported, Single extremity supported Sitting balance-Leahy Scale: Fair       Standing balance-Leahy Scale: Zero                             ADL either performed or assessed with clinical judgement   ADL Overall ADL's : Needs assistance/impaired                                       General ADL Comments: MOD A to come to sit at EOB with MAX A required to perform lateral scooting and to shift hips forward. Pt with significant difficulty sequencing mobility tasks. Atempts STS with RW, but is unable to lift hips off seated surface despite max assist. Anticipate +2 for mobility/STS t/fs. MAX A for LB ADL management and toileting at this time.  Vision Patient Visual Report: No change from baseline       Perception         Praxis         Pertinent Vitals/Pain Pain Assessment Pain Assessment: No/denies pain     Extremity/Trunk Assessment Upper Extremity Assessment Upper Extremity Assessment: Generalized weakness   Lower Extremity Assessment Lower Extremity Assessment: Generalized weakness;RLE deficits/detail RLE Deficits / Details: s/p R BKA, bandages clean, dry, intact at start/end of session. Pt denies any discomfort at time of OT eval.       Communication Communication Communication: No apparent difficulties    Cognition Arousal: Alert Behavior During Therapy: WFL for tasks assessed/performed Cognition: History of cognitive impairments, No family/caregiver present to determine baseline             OT - Cognition Comments: Pt is oriented to self, place, and limited situation. States date is May 1925. Pleasant t/o but no caregiver to determine cognitive baseline. Requires multimodal cueing for sequencing of multi-step tasks.                 Following commands: Impaired Following commands impaired: Follows one step commands with increased time, Follows multi-step commands inconsistently     Cueing  General Comments   Cueing Techniques: Verbal cues;Gestural cues;Visual cues      Exercises Other Exercises Other Exercises: Pt educated on residual limb management techniques, falls prevention strategies, safe transfer technique, and DC recs.   Shoulder Instructions      Home Living Family/patient expects to be discharged to:: Assisted living                                 Additional Comments: Per chart, pt from [facility]. Pt is limited historian, states he lives with his parents and grandparents.      Prior Functioning/Environment Prior Level of Function : Needs assist             Mobility Comments: Pt reports he uses a WC but is unable to state how he transfers. Per chart pt pivot transfers to/from [facility]. ADLs Comments: Pt reports he is typically independent with basic needs like dressing, feeding himself, and toileting. No caregiver available to confirm.    OT Problem List: Decreased strength;Decreased coordination;Pain;Decreased range of motion;Decreased activity tolerance;Decreased safety awareness;Impaired balance (sitting and/or standing);Decreased knowledge of use of DME or AE   OT Treatment/Interventions: Self-care/ADL training;Therapeutic exercise;Therapeutic activities;DME and/or AE instruction;Patient/family education;Balance training;Energy  conservation      OT Goals(Current goals can be found in the care plan section)   Acute Rehab OT Goals Patient Stated Goal: To go home OT Goal Formulation: With patient Time For Goal Achievement: 06/30/24 Potential to Achieve Goals: Fair ADL Goals Pt Will Perform Grooming: sitting;with set-up;with supervision Pt Will Perform Lower Body Dressing: sit to/from stand;with min assist;with adaptive equipment;sitting/lateral leans Pt Will Transfer to Toilet: bedside commode;stand pivot transfer;with min assist Pt Will Perform Toileting - Clothing Manipulation and hygiene: sitting/lateral leans;with adaptive equipment;with min assist   OT Frequency:  Min 2X/week    Co-evaluation              AM-PAC OT 6 Clicks Daily Activity     Outcome Measure Help from another person eating meals?: A Little Help from another person taking care of personal grooming?: A Little Help from another person toileting, which includes using toliet, bedpan, or urinal?: A Lot Help from another person bathing (including  washing, rinsing, drying)?: A Lot Help from another person to put on and taking off regular upper body clothing?: A Little Help from another person to put on and taking off regular lower body clothing?: A Lot 6 Click Score: 15   End of Session Equipment Utilized During Treatment: Gait belt;Rolling walker (2 wheels) Nurse Communication: Mobility status  Activity Tolerance: Patient tolerated treatment well Patient left: in bed;with call bell/phone within reach;with bed alarm set  OT Visit Diagnosis: Other abnormalities of gait and mobility (R26.89);Muscle weakness (generalized) (M62.81);Pain Pain - Right/Left: Right Pain - part of body: Knee;Leg                Time: 9165-9094 OT Time Calculation (min): 31 min Charges:  OT General Charges $OT Visit: 1 Visit OT Evaluation $OT Eval Moderate Complexity: 1 Mod OT Treatments $Self Care/Home Management : 8-22 mins  Jhonny Pelton,  M.S., OTR/L 06/16/24, 11:52 AM

## 2024-06-16 NOTE — Progress Notes (Signed)
 [facility]  ROUNDING NOTE   Subjective:   Anthony Houston is a 73 year old male with past medical conditions including hypertension, GERD, dementia, CAD, and end-stage renal disease on hemodialysis.  Patient presents to the emergency department for evaluation of foot wound and pain and has been admitted for Foot ulcer (HCC) [L97.509] Right foot infection [L08.9]  Patient is known to our practice and receives outpatient dialysis treatments at [facility] on a TTS schedule, supervised by Dr. Dennise.    Update:   Patient seen sitting up in bed Alert Room air Denies pain  Objective:  Vital signs in last 24 hours:  Temp:  [97.2 F (36.2 C)-98.6 F (37 C)] 98.4 F (36.9 C) (12/05 0808) Pulse Rate:  [66-73] 73 (12/05 0808) Resp:  [14-22] 17 (12/05 0808) BP: (102-115)/(51-65) 115/55 (12/05 0808) SpO2:  [99 %-100 %] 99 % (12/05 0808)  Weight change:  Filed Weights   06/10/24 1055 06/13/24 0732 06/13/24 1128  Weight: 93.3 kg 91.9 kg 90.4 kg    Intake/Output: I/O last 3 completed shifts: In: 120 [P.O.:120] Out: 1400 [Other:1400]   Intake/Output this shift:  Total I/O In: 240 [P.O.:240] Out: -   Physical Exam: General: NAD  Head: Normocephalic, atraumatic. Moist oral mucosal membranes  Eyes: Anicteric  Lungs:  Clear to auscultation, normal effort  Heart: Regular rate and rhythm  Abdomen:  Soft, nontender  Extremities: Trace peripheral edema.  Neurologic: Awake, alert  Skin: Warm,dry, Right lBKA  Access: Lt AVF    Basic Metabolic Panel: Recent Labs  Lab 06/13/24 0740 06/14/24 1138 06/14/24 1448 06/15/24 0755 06/16/24 0542  NA 133* 132*  --  130* 132*  K 5.0 5.0 5.4* 5.3* 4.8  CL 93* 96*  --  91* 93*  CO2 25  --   --  23 27  GLUCOSE 81 82  --  95 97  BUN 51* 33*  --  43* 27*  CREATININE 8.37* 6.80*  --  7.46* 5.47*  CALCIUM  8.5*  --   --  8.7* 8.4*  MG  --   --   --  2.2 2.0  PHOS 3.4  --   --  3.1 2.9    Liver Function  Tests: Recent Labs  Lab 06/13/24 0740  ALBUMIN 3.0*   No results for input(s): LIPASE, AMYLASE in the last 168 hours. No results for input(s): AMMONIA in the last 168 hours.  CBC: Recent Labs  Lab 06/12/24 0800 06/13/24 0740 06/14/24 1138 06/15/24 0755 06/16/24 0542  WBC 6.3 7.0  --  10.8* 8.2  NEUTROABS 3.5  --   --   --   --   HGB 9.3* 8.8* 10.9* 8.9* 8.0*  HCT 28.4* 26.8* 32.0* 27.4* 24.9*  MCV 85.0 84.8  --  85.4 85.9  PLT 273 260  --  275 301    Cardiac Enzymes: No results for input(s): CKTOTAL, CKMB, CKMBINDEX, TROPONINI in the last 168 hours.  BNP: Invalid input(s): POCBNP  CBG: Recent Labs  Lab 06/10/24 0715 06/11/24 0722 06/14/24 0815 06/15/24 0726 06/16/24 0809  GLUCAP 115* 90 101* 94 94    Microbiology: Results for orders placed or performed during the hospital encounter of 06/05/24  Culture, blood (single) w Reflex to ID Panel     Status: None   Collection Time: 06/05/24  4:58 PM   Specimen: BLOOD  Result Value Ref Range Status   Specimen Description BLOOD LEFT ANTECUBITAL  Final   Special Requests   Final  BOTTLES DRAWN AEROBIC AND ANAEROBIC Blood Culture adequate volume   Culture   Final    NO GROWTH 5 DAYS Performed at [facility], [address] [place], [place] [address]    Report Status 06/11/2024 FINAL  Final  Culture, blood (Routine X 2) w Reflex to ID Panel     Status: None   Collection Time: 06/05/24  9:39 PM   Specimen: BLOOD  Result Value Ref Range Status   Specimen Description BLOOD BLOOD RIGHT ARM  Final   Special Requests   Final    BOTTLES DRAWN AEROBIC AND ANAEROBIC Blood Culture adequate volume   Culture   Final    NO GROWTH 5 DAYS Performed at [facility], [address] [place], [place] [address]    Report Status 06/11/2024 FINAL  Final  Aerobic Culture w Gram Stain (superficial specimen)     Status: None   Collection Time: 06/05/24  9:51 PM   Specimen: Wound  Result Value  Ref Range Status   Specimen Description   Final    WOUND Performed at [facility], [address], [place], [place] [address]    Special Requests   Final    NONE Performed at [facility], [address] [place], [place] [address]    Gram Stain   Final    RARE WBC PRESENT, PREDOMINANTLY PMN RARE GRAM NEGATIVE RODS RARE GRAM POSITIVE COCCI Performed at [facility], [address] [place], [place] [address]    Culture   Final    ABUNDANT PSEUDOMONAS AERUGINOSA ABUNDANT ENTEROBACTER CLOACAE MODERATE KLEBSIELLA PNEUMONIAE    Report Status 06/10/2024 FINAL  Final   Organism ID, Bacteria ENTEROBACTER CLOACAE  Final   Organism ID, Bacteria KLEBSIELLA PNEUMONIAE  Final   Organism ID, Bacteria PSEUDOMONAS AERUGINOSA  Final      Susceptibility   Enterobacter cloacae - MIC*    CEFEPIME  <=0.12 SENSITIVE Sensitive     ERTAPENEM <=0.12 SENSITIVE Sensitive     CIPROFLOXACIN <=0.06 SENSITIVE Sensitive     GENTAMICIN <=1 SENSITIVE Sensitive     MEROPENEM <=0.25 SENSITIVE Sensitive     TRIMETH/SULFA <=20 SENSITIVE Sensitive     PIP/TAZO Value in next row Sensitive      <=4 SENSITIVEThis is a modified FDA-approved test that has been validated and its performance characteristics determined by the reporting laboratory.  This laboratory is certified under the Clinical Laboratory Improvement Amendments CLIA as qualified to perform high complexity clinical laboratory testing.    * ABUNDANT ENTEROBACTER CLOACAE   Klebsiella pneumoniae - MIC*    AMPICILLIN Value in next row Resistant      <=4 SENSITIVEThis is a modified FDA-approved test that has been validated and its performance characteristics determined by the reporting laboratory.  This laboratory is certified under the Clinical Laboratory Improvement Amendments CLIA as qualified to perform high complexity clinical laboratory testing.    CEFAZOLIN  (NON-URINE) Value in next row Sensitive      <=4 SENSITIVEThis is a  modified FDA-approved test that has been validated and its performance characteristics determined by the reporting laboratory.  This laboratory is certified under the Clinical Laboratory Improvement Amendments CLIA as qualified to perform high complexity clinical laboratory testing.    CEFEPIME  Value in next row Sensitive      <=4 SENSITIVEThis is a modified FDA-approved test that has been validated and its performance characteristics determined by the reporting laboratory.  This laboratory is certified under the Clinical Laboratory Improvement Amendments CLIA as  qualified to perform high complexity clinical laboratory testing.    ERTAPENEM Value in next row Sensitive      <=4 SENSITIVEThis is a modified FDA-approved test that has been validated and its performance characteristics determined by the reporting laboratory.  This laboratory is certified under the Clinical Laboratory Improvement Amendments CLIA as qualified to perform high complexity clinical laboratory testing.    CEFTRIAXONE  Value in next row Sensitive      <=4 SENSITIVEThis is a modified FDA-approved test that has been validated and its performance characteristics determined by the reporting laboratory.  This laboratory is certified under the Clinical Laboratory Improvement Amendments CLIA as qualified to perform high complexity clinical laboratory testing.    CIPROFLOXACIN Value in next row Sensitive      <=4 SENSITIVEThis is a modified FDA-approved test that has been validated and its performance characteristics determined by the reporting laboratory.  This laboratory is certified under the Clinical Laboratory Improvement Amendments CLIA as qualified to perform high complexity clinical laboratory testing.    GENTAMICIN Value in next row Sensitive      <=4 SENSITIVEThis is a modified FDA-approved test that has been validated and its performance characteristics determined by the reporting laboratory.  This laboratory is certified under the  Clinical Laboratory Improvement Amendments CLIA as qualified to perform high complexity clinical laboratory testing.    MEROPENEM Value in next row Sensitive      <=4 SENSITIVEThis is a modified FDA-approved test that has been validated and its performance characteristics determined by the reporting laboratory.  This laboratory is certified under the Clinical Laboratory Improvement Amendments CLIA as qualified to perform high complexity clinical laboratory testing.    TRIMETH/SULFA Value in next row Sensitive      <=4 SENSITIVEThis is a modified FDA-approved test that has been validated and its performance characteristics determined by the reporting laboratory.  This laboratory is certified under the Clinical Laboratory Improvement Amendments CLIA as qualified to perform high complexity clinical laboratory testing.    AMPICILLIN/SULBACTAM Value in next row Sensitive      <=4 SENSITIVEThis is a modified FDA-approved test that has been validated and its performance characteristics determined by the reporting laboratory.  This laboratory is certified under the Clinical Laboratory Improvement Amendments CLIA as qualified to perform high complexity clinical laboratory testing.    PIP/TAZO Value in next row Sensitive      <=4 SENSITIVEThis is a modified FDA-approved test that has been validated and its performance characteristics determined by the reporting laboratory.  This laboratory is certified under the Clinical Laboratory Improvement Amendments CLIA as qualified to perform high complexity clinical laboratory testing.    * MODERATE KLEBSIELLA PNEUMONIAE   Pseudomonas aeruginosa - MIC*    MEROPENEM Value in next row Sensitive      <=4 SENSITIVEThis is a modified FDA-approved test that has been validated and its performance characteristics determined by the reporting laboratory.  This laboratory is certified under the Clinical Laboratory Improvement Amendments CLIA as qualified to perform high complexity  clinical laboratory testing.    CIPROFLOXACIN Value in next row Sensitive      <=4 SENSITIVEThis is a modified FDA-approved test that has been validated and its performance characteristics determined by the reporting laboratory.  This laboratory is certified under the Clinical Laboratory Improvement Amendments CLIA as qualified to perform high complexity clinical laboratory testing.    IMIPENEM Value in next row Sensitive      <=4 SENSITIVEThis is a modified FDA-approved test that has been validated and its  performance characteristics determined by the reporting laboratory.  This laboratory is certified under the Clinical Laboratory Improvement Amendments CLIA as qualified to perform high complexity clinical laboratory testing.    PIP/TAZO Value in next row Sensitive      16 SENSITIVEThis is a modified FDA-approved test that has been validated and its performance characteristics determined by the reporting laboratory.  This laboratory is certified under the Clinical Laboratory Improvement Amendments CLIA as qualified to perform high complexity clinical laboratory testing.    CEFEPIME  Value in next row Sensitive      16 SENSITIVEThis is a modified FDA-approved test that has been validated and its performance characteristics determined by the reporting laboratory.  This laboratory is certified under the Clinical Laboratory Improvement Amendments CLIA as qualified to perform high complexity clinical laboratory testing.    CEFTAZIDIME/AVIBACTAM Value in next row Sensitive      16 SENSITIVEThis is a modified FDA-approved test that has been validated and its performance characteristics determined by the reporting laboratory.  This laboratory is certified under the Clinical Laboratory Improvement Amendments CLIA as qualified to perform high complexity clinical laboratory testing.    CEFTOLOZANE/TAZOBACTAM Value in next row Sensitive      16 SENSITIVEThis is a modified FDA-approved test that has been validated  and its performance characteristics determined by the reporting laboratory.  This laboratory is certified under the Clinical Laboratory Improvement Amendments CLIA as qualified to perform high complexity clinical laboratory testing.    TOBRAMYCIN Value in next row Sensitive      16 SENSITIVEThis is a modified FDA-approved test that has been validated and its performance characteristics determined by the reporting laboratory.  This laboratory is certified under the Clinical Laboratory Improvement Amendments CLIA as qualified to perform high complexity clinical laboratory testing.    CEFTAZIDIME Value in next row Sensitive      16 SENSITIVEThis is a modified FDA-approved test that has been validated and its performance characteristics determined by the reporting laboratory.  This laboratory is certified under the Clinical Laboratory Improvement Amendments CLIA as qualified to perform high complexity clinical laboratory testing.    * ABUNDANT PSEUDOMONAS AERUGINOSA  Culture, blood (Routine X 2) w Reflex to ID Panel     Status: None   Collection Time: 06/05/24 11:25 PM   Specimen: BLOOD  Result Value Ref Range Status   Specimen Description BLOOD RIGHT ANTECUBITAL  Final   Special Requests   Final    BOTTLES DRAWN AEROBIC AND ANAEROBIC Blood Culture adequate volume   Culture   Final    NO GROWTH 5 DAYS Performed at [facility], [address], [place], [place] [address]    Report Status 06/10/2024 FINAL  Final  MRSA Next Gen by PCR, Nasal     Status: None   Collection Time: 06/09/24  8:11 AM   Specimen: Nasal Mucosa; Nasal Swab  Result Value Ref Range Status   MRSA by PCR Next Gen NOT DETECTED NOT DETECTED Final    Comment: (NOTE) The GeneXpert MRSA Assay (FDA approved for NASAL specimens only), is one component of a comprehensive MRSA colonization surveillance program. It is not intended to diagnose MRSA infection nor to guide or monitor treatment for MRSA infections. Test  performance is not FDA approved in patients less than 69 years old. Performed at [facility], [address] [place], [place] [address]     Coagulation Studies: No results for input(s): LABPROT, INR in the last 72 hours.   Urinalysis: No results for input(s):  COLORURINE, LABSPEC, PHURINE, GLUCOSEU, HGBUR, BILIRUBINUR, KETONESUR, PROTEINUR, UROBILINOGEN, NITRITE, LEUKOCYTESUR in the last 72 hours.  Invalid input(s): APPERANCEUR    Imaging: No results found.    Medications:      acetaminophen   650 mg Oral TID   aspirin   81 mg Oral Daily   brimonidine   1 drop Both Eyes BID   And   timolol   1 drop Both Eyes BID   calcitRIOL   0.25 mcg Oral Daily   Chlorhexidine  Gluconate Cloth  6 each Topical Q0600   clopidogrel   75 mg Oral Daily   collagenase    Topical Daily   vitamin B-12  1,000 mcg Oral Daily   donepezil   10 mg Oral QHS   entecavir   0.5 mg Oral Q72H   epoetin  alfa-epbx (RETACRIT ) injection  4,000 Units Intravenous Q T,Th,Sat-1800   folic acid   1 mg Oral Daily   gabapentin   200 mg Oral QHS   heparin   5,000 Units Subcutaneous Q8H   latanoprost   1 drop Both Eyes QHS   memantine   5 mg Oral BID   senna-docusate  2 tablet Oral BID   simvastatin   10 mg Oral QHS   hydrALAZINE , HYDROmorphone  (DILAUDID ) injection, HYDROmorphone , ondansetron  (ZOFRAN ) IV, polyethylene glycol  Assessment/ Plan:  [name] is a 73 y.o.  male with past medical conditions including hypertension, GERD, dementia, CAD, and end-stage renal disease on hemodialysis.  Patient presents to the emergency department for evaluation of foot wound and pain and has been admitted for Foot ulcer (HCC) [L97.509] Right foot infection [L08.9]   End stage renal disease on hemodialysis.  Dialysis received yesterday, UF 1.4L achieved. Next treatment scheduled for Saturday. Monitoring discharge plans.   2. Anemia of chronic kidney disease Lab Results  Component  Value Date   HGB 8.0 (L) 06/16/2024  Hgb decreased, will monitor and increase Retacrit  if needed.   3. Secondary Hyperparathyroidism: with outpatient labs:none available  Lab Results  Component Value Date   PTH 101 (H) 07/26/2018   CALCIUM  8.4 (L) 06/16/2024   CAION 1.10 (L) 06/14/2024   PHOS 2.9 06/16/2024    Bone minerals acceptable  4. Hypotension with chronic kidney disease. Prescribed Midodrine outpatient. Currently held  5. 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 Has received Vancomycin  and Zosyn  during this admission.    LOS: 10 Jenae Tomasello 12/5/20252:30 PM

## 2024-06-17 DIAGNOSIS — L089 Local infection of the skin and subcutaneous tissue, unspecified: Secondary | ICD-10-CM | POA: Diagnosis not present

## 2024-06-17 LAB — BASIC METABOLIC PANEL WITH GFR
Anion gap: 16 — ABNORMAL HIGH (ref 5–15)
BUN: 31 mg/dL — ABNORMAL HIGH (ref 8–23)
CO2: 22 mmol/L (ref 22–32)
Calcium: 8.4 mg/dL — ABNORMAL LOW (ref 8.9–10.3)
Chloride: 94 mmol/L — ABNORMAL LOW (ref 98–111)
Creatinine, Ser: 7.39 mg/dL — ABNORMAL HIGH (ref 0.61–1.24)
GFR, Estimated: 7 mL/min — ABNORMAL LOW (ref >60)
Glucose, Bld: 73 mg/dL (ref 70–99)
Potassium: 5.3 mmol/L — ABNORMAL HIGH (ref 3.5–5.1)
Sodium: 132 mmol/L — ABNORMAL LOW (ref 135–145)

## 2024-06-17 LAB — CBC
HCT: 22.5 % — ABNORMAL LOW (ref 39.0–52.0)
Hemoglobin: 7.2 g/dL — ABNORMAL LOW (ref 13.0–17.0)
MCH: 28 pg (ref 26.0–34.0)
MCHC: 32 g/dL (ref 30.0–36.0)
MCV: 87.5 fL (ref 80.0–100.0)
Platelets: 319 K/uL (ref 150–400)
RBC: 2.57 MIL/uL — ABNORMAL LOW (ref 4.22–5.81)
RDW: 16 % — ABNORMAL HIGH (ref 11.5–15.5)
WBC: 9.3 K/uL (ref 4.0–10.5)
nRBC: 0 % (ref 0.0–0.2)

## 2024-06-17 LAB — PREPARE RBC (CROSSMATCH)

## 2024-06-17 LAB — PHOSPHORUS: Phosphorus: 3.7 mg/dL (ref 2.5–4.6)

## 2024-06-17 LAB — GLUCOSE, CAPILLARY: Glucose-Capillary: 128 mg/dL — ABNORMAL HIGH (ref 70–99)

## 2024-06-17 LAB — MAGNESIUM: Magnesium: 2.2 mg/dL (ref 1.7–2.4)

## 2024-06-17 MED ORDER — EPOETIN ALFA-EPBX 4000 UNIT/ML IJ SOLN
INTRAMUSCULAR | Status: AC
  Filled 2024-06-17: qty 1

## 2024-06-17 MED ORDER — MIDODRINE HCL 5 MG PO TABS: 5.0000 mg | ORAL_TABLET | Freq: Three times a day (TID) | ORAL | Status: DC | PRN

## 2024-06-17 MED ORDER — SODIUM CHLORIDE 0.9% IV SOLUTION: Freq: Once | INTRAVENOUS | Status: DC

## 2024-06-17 NOTE — Progress Notes (Signed)
 Hemodialysis Note:  Received patient in bed to unit. Alert and oriented. Informed consent singed and in chart.  Treatment initiated: 0825 Treatment completed: 1200  Access used: Left AVF Access issues: None  1 unit of blood was given during dialysis. No transfusion reaction noted. Patient tolerated well. Transported back to room, alert without acute distress. Report given to patient's RN.  Total UF removed: 1 liter Medications given: Retracrit 4000 units IV  Post HD weight: 90.3 Kg  [facility]

## 2024-06-17 NOTE — Plan of Care (Signed)

## 2024-06-17 NOTE — Progress Notes (Signed)
 [facility]  ROUNDING NOTE   Subjective:   Anthony Houston is a 73 year old male with past medical conditions including hypertension, GERD, dementia, CAD, and end-stage renal disease on hemodialysis.  Patient presents to the emergency department for evaluation of foot wound and pain and has been admitted for Foot ulcer (HCC) [L97.509] Right foot infection [L08.9]  Patient is known to our practice and receives outpatient dialysis treatments at [facility] on a TTS schedule, supervised by Dr. Dennise.    Update:   Patient seen and evaluated during dialysis   HEMODIALYSIS FLOWSHEET:  Blood Flow Rate (mL/min): 399 mL/min Arterial Pressure (mmHg): -186.66 mmHg Venous Pressure (mmHg): 187.67 mmHg TMP (mmHg): 2.83 mmHg Ultrafiltration Rate (mL/min): 451 mL/min Dialysate Flow Rate (mL/min): 299 ml/min Dialysis Fluid Bolus: Normal Saline Bolus Amount (mL): 100 mL  No complaints to offer  Objective:  Vital signs in last 24 hours:  Temp:  [97.5 F (36.4 C)-99.4 F (37.4 C)] 98.1 F (36.7 C) (12/06 1122) Pulse Rate:  [64-78] 67 (12/06 1107) Resp:  [15-23] 16 (12/06 1107) BP: (90-130)/(53-74) 126/65 (12/06 1122) SpO2:  [97 %-100 %] 97 % (12/06 1107) Weight:  [91.3 kg] 91.3 kg (12/06 0804)  Weight change:  Filed Weights   06/13/24 0732 06/13/24 1128 06/17/24 0804  Weight: 91.9 kg 90.4 kg 91.3 kg    Intake/Output: I/O last 3 completed shifts: In: 240 [P.O.:240] Out: -    Intake/Output this shift:  Total I/O In: 529.5 [Blood:529.5] Out: -   Physical Exam: General: NAD  Head: Normocephalic, atraumatic. Moist oral mucosal membranes  Eyes: Anicteric  Lungs:  Clear to auscultation, normal effort  Heart: Regular rate and rhythm  Abdomen:  Soft, nontender  Extremities: Trace peripheral edema.  Neurologic: Awake, alert  Skin: Warm,dry, Right lBKA  Access: Lt AVF    Basic Metabolic Panel: Recent Labs  Lab 06/13/24 0740 06/14/24 1138  06/14/24 1448 06/15/24 0755 06/16/24 0542 06/17/24 0311  NA 133* 132*  --  130* 132* 132*  K 5.0 5.0 5.4* 5.3* 4.8 5.3*  CL 93* 96*  --  91* 93* 94*  CO2 25  --   --  23 27 22   GLUCOSE 81 82  --  95 97 73  BUN 51* 33*  --  43* 27* 31*  CREATININE 8.37* 6.80*  --  7.46* 5.47* 7.39*  CALCIUM  8.5*  --   --  8.7* 8.4* 8.4*  MG  --   --   --  2.2 2.0 2.2  PHOS 3.4  --   --  3.1 2.9 3.7    Liver Function Tests: Recent Labs  Lab 06/13/24 0740  ALBUMIN 3.0*   No results for input(s): LIPASE, AMYLASE in the last 168 hours. No results for input(s): AMMONIA in the last 168 hours.  CBC: Recent Labs  Lab 06/12/24 0800 06/13/24 0740 06/14/24 1138 06/15/24 0755 06/16/24 0542 06/17/24 0830  WBC 6.3 7.0  --  10.8* 8.2 9.3  NEUTROABS 3.5  --   --   --   --   --   HGB 9.3* 8.8* 10.9* 8.9* 8.0* 7.2*  HCT 28.4* 26.8* 32.0* 27.4* 24.9* 22.5*  MCV 85.0 84.8  --  85.4 85.9 87.5  PLT 273 260  --  275 301 319    Cardiac Enzymes: No results for input(s): CKTOTAL, CKMB, CKMBINDEX, TROPONINI in the last 168 hours.  BNP: Invalid input(s): POCBNP  CBG: Recent Labs  Lab 06/14/24 0815 06/15/24 0726 06/16/24 0809 06/16/24 2108 06/17/24  0727  GLUCAP 101* 94 94 91 128*    Microbiology: Results for orders placed or performed during the hospital encounter of 06/05/24  Culture, blood (single) w Reflex to ID Panel     Status: None   Collection Time: 06/05/24  4:58 PM   Specimen: BLOOD  Result Value Ref Range Status   Specimen Description BLOOD LEFT ANTECUBITAL  Final   Special Requests   Final    BOTTLES DRAWN AEROBIC AND ANAEROBIC Blood Culture adequate volume   Culture   Final    NO GROWTH 5 DAYS Performed at [facility], [address], [place], [place] [address]    Report Status 06/11/2024 FINAL  Final  Culture, blood (Routine X 2) w Reflex to ID Panel     Status: None   Collection Time: 06/05/24  9:39 PM   Specimen: BLOOD  Result Value Ref Range  Status   Specimen Description BLOOD BLOOD RIGHT ARM  Final   Special Requests   Final    BOTTLES DRAWN AEROBIC AND ANAEROBIC Blood Culture adequate volume   Culture   Final    NO GROWTH 5 DAYS Performed at [facility], [address] [place], [place] [address]    Report Status 06/11/2024 FINAL  Final  Aerobic Culture w Gram Stain (superficial specimen)     Status: None   Collection Time: 06/05/24  9:51 PM   Specimen: Wound  Result Value Ref Range Status   Specimen Description   Final    WOUND Performed at [facility], [address], [place], [place] [address]    Special Requests   Final    NONE Performed at [facility], [address] [place], [place] [address]    Gram Stain   Final    RARE WBC PRESENT, PREDOMINANTLY PMN RARE GRAM NEGATIVE RODS RARE GRAM POSITIVE COCCI Performed at [facility], [address], [place], [place] [address]    Culture   Final    ABUNDANT PSEUDOMONAS AERUGINOSA ABUNDANT ENTEROBACTER CLOACAE MODERATE KLEBSIELLA PNEUMONIAE    Report Status 06/10/2024 FINAL  Final   Organism ID, Bacteria ENTEROBACTER CLOACAE  Final   Organism ID, Bacteria KLEBSIELLA PNEUMONIAE  Final   Organism ID, Bacteria PSEUDOMONAS AERUGINOSA  Final      Susceptibility   Enterobacter cloacae - MIC*    CEFEPIME  <=0.12 SENSITIVE Sensitive     ERTAPENEM <=0.12 SENSITIVE Sensitive     CIPROFLOXACIN <=0.06 SENSITIVE Sensitive     GENTAMICIN <=1 SENSITIVE Sensitive     MEROPENEM <=0.25 SENSITIVE Sensitive     TRIMETH/SULFA <=20 SENSITIVE Sensitive     PIP/TAZO Value in next row Sensitive      <=4 SENSITIVEThis is a modified FDA-approved test that has been validated and its performance characteristics determined by the reporting laboratory.  This laboratory is certified under the Clinical Laboratory Improvement Amendments CLIA as qualified to perform high complexity clinical laboratory testing.    * ABUNDANT ENTEROBACTER CLOACAE    Klebsiella pneumoniae - MIC*    AMPICILLIN Value in next row Resistant      <=4 SENSITIVEThis is a modified FDA-approved test that has been validated and its performance characteristics determined by the reporting laboratory.  This laboratory is certified under the Clinical Laboratory Improvement Amendments CLIA as qualified to perform high complexity clinical laboratory testing.    CEFAZOLIN  (NON-URINE) Value in next row Sensitive      <=4 SENSITIVEThis is a modified FDA-approved test that has been validated and  its performance characteristics determined by the reporting laboratory.  This laboratory is certified under the Clinical Laboratory Improvement Amendments CLIA as qualified to perform high complexity clinical laboratory testing.    CEFEPIME  Value in next row Sensitive      <=4 SENSITIVEThis is a modified FDA-approved test that has been validated and its performance characteristics determined by the reporting laboratory.  This laboratory is certified under the Clinical Laboratory Improvement Amendments CLIA as qualified to perform high complexity clinical laboratory testing.    ERTAPENEM Value in next row Sensitive      <=4 SENSITIVEThis is a modified FDA-approved test that has been validated and its performance characteristics determined by the reporting laboratory.  This laboratory is certified under the Clinical Laboratory Improvement Amendments CLIA as qualified to perform high complexity clinical laboratory testing.    CEFTRIAXONE  Value in next row Sensitive      <=4 SENSITIVEThis is a modified FDA-approved test that has been validated and its performance characteristics determined by the reporting laboratory.  This laboratory is certified under the Clinical Laboratory Improvement Amendments CLIA as qualified to perform high complexity clinical laboratory testing.    CIPROFLOXACIN Value in next row Sensitive      <=4 SENSITIVEThis is a modified FDA-approved test that has been validated and  its performance characteristics determined by the reporting laboratory.  This laboratory is certified under the Clinical Laboratory Improvement Amendments CLIA as qualified to perform high complexity clinical laboratory testing.    GENTAMICIN Value in next row Sensitive      <=4 SENSITIVEThis is a modified FDA-approved test that has been validated and its performance characteristics determined by the reporting laboratory.  This laboratory is certified under the Clinical Laboratory Improvement Amendments CLIA as qualified to perform high complexity clinical laboratory testing.    MEROPENEM Value in next row Sensitive      <=4 SENSITIVEThis is a modified FDA-approved test that has been validated and its performance characteristics determined by the reporting laboratory.  This laboratory is certified under the Clinical Laboratory Improvement Amendments CLIA as qualified to perform high complexity clinical laboratory testing.    TRIMETH/SULFA Value in next row Sensitive      <=4 SENSITIVEThis is a modified FDA-approved test that has been validated and its performance characteristics determined by the reporting laboratory.  This laboratory is certified under the Clinical Laboratory Improvement Amendments CLIA as qualified to perform high complexity clinical laboratory testing.    AMPICILLIN/SULBACTAM Value in next row Sensitive      <=4 SENSITIVEThis is a modified FDA-approved test that has been validated and its performance characteristics determined by the reporting laboratory.  This laboratory is certified under the Clinical Laboratory Improvement Amendments CLIA as qualified to perform high complexity clinical laboratory testing.    PIP/TAZO Value in next row Sensitive      <=4 SENSITIVEThis is a modified FDA-approved test that has been validated and its performance characteristics determined by the reporting laboratory.  This laboratory is certified under the Clinical Laboratory Improvement Amendments CLIA  as qualified to perform high complexity clinical laboratory testing.    * MODERATE KLEBSIELLA PNEUMONIAE   Pseudomonas aeruginosa - MIC*    MEROPENEM Value in next row Sensitive      <=4 SENSITIVEThis is a modified FDA-approved test that has been validated and its performance characteristics determined by the reporting laboratory.  This laboratory is certified under the Clinical Laboratory Improvement Amendments CLIA as qualified to perform high complexity clinical laboratory testing.    CIPROFLOXACIN Value in  next row Sensitive      <=4 SENSITIVEThis is a modified FDA-approved test that has been validated and its performance characteristics determined by the reporting laboratory.  This laboratory is certified under the Clinical Laboratory Improvement Amendments CLIA as qualified to perform high complexity clinical laboratory testing.    IMIPENEM Value in next row Sensitive      <=4 SENSITIVEThis is a modified FDA-approved test that has been validated and its performance characteristics determined by the reporting laboratory.  This laboratory is certified under the Clinical Laboratory Improvement Amendments CLIA as qualified to perform high complexity clinical laboratory testing.    PIP/TAZO Value in next row Sensitive      16 SENSITIVEThis is a modified FDA-approved test that has been validated and its performance characteristics determined by the reporting laboratory.  This laboratory is certified under the Clinical Laboratory Improvement Amendments CLIA as qualified to perform high complexity clinical laboratory testing.    CEFEPIME  Value in next row Sensitive      16 SENSITIVEThis is a modified FDA-approved test that has been validated and its performance characteristics determined by the reporting laboratory.  This laboratory is certified under the Clinical Laboratory Improvement Amendments CLIA as qualified to perform high complexity clinical laboratory testing.    CEFTAZIDIME/AVIBACTAM Value in  next row Sensitive      16 SENSITIVEThis is a modified FDA-approved test that has been validated and its performance characteristics determined by the reporting laboratory.  This laboratory is certified under the Clinical Laboratory Improvement Amendments CLIA as qualified to perform high complexity clinical laboratory testing.    CEFTOLOZANE/TAZOBACTAM Value in next row Sensitive      16 SENSITIVEThis is a modified FDA-approved test that has been validated and its performance characteristics determined by the reporting laboratory.  This laboratory is certified under the Clinical Laboratory Improvement Amendments CLIA as qualified to perform high complexity clinical laboratory testing.    TOBRAMYCIN Value in next row Sensitive      16 SENSITIVEThis is a modified FDA-approved test that has been validated and its performance characteristics determined by the reporting laboratory.  This laboratory is certified under the Clinical Laboratory Improvement Amendments CLIA as qualified to perform high complexity clinical laboratory testing.    CEFTAZIDIME Value in next row Sensitive      16 SENSITIVEThis is a modified FDA-approved test that has been validated and its performance characteristics determined by the reporting laboratory.  This laboratory is certified under the Clinical Laboratory Improvement Amendments CLIA as qualified to perform high complexity clinical laboratory testing.    * ABUNDANT PSEUDOMONAS AERUGINOSA  Culture, blood (Routine X 2) w Reflex to ID Panel     Status: None   Collection Time: 06/05/24 11:25 PM   Specimen: BLOOD  Result Value Ref Range Status   Specimen Description BLOOD RIGHT ANTECUBITAL  Final   Special Requests   Final    BOTTLES DRAWN AEROBIC AND ANAEROBIC Blood Culture adequate volume   Culture   Final    NO GROWTH 5 DAYS Performed at [facility], [address] [place], [place] [address]    Report Status 06/10/2024 FINAL  Final  MRSA Next Gen by PCR,  Nasal     Status: None   Collection Time: 06/09/24  8:11 AM   Specimen: Nasal Mucosa; Nasal Swab  Result Value Ref Range Status   MRSA by PCR Next Gen NOT DETECTED NOT DETECTED Final    Comment: (NOTE) The GeneXpert MRSA Assay (FDA approved for NASAL specimens only),  is one component of a comprehensive MRSA colonization surveillance program. It is not intended to diagnose MRSA infection nor to guide or monitor treatment for MRSA infections. Test performance is not FDA approved in patients less than 99 years old. Performed at [facility], [address] [place], [place] [address]     Coagulation Studies: No results for input(s): LABPROT, INR in the last 72 hours.   Urinalysis: No results for input(s): COLORURINE, LABSPEC, PHURINE, GLUCOSEU, HGBUR, BILIRUBINUR, KETONESUR, PROTEINUR, UROBILINOGEN, NITRITE, LEUKOCYTESUR in the last 72 hours.  Invalid input(s): APPERANCEUR    Imaging: No results found.    Medications:      sodium chloride    Intravenous Once   acetaminophen   650 mg Oral TID   aspirin   81 mg Oral Daily   brimonidine   1 drop Both Eyes BID   And   timolol   1 drop Both Eyes BID   calcitRIOL   0.25 mcg Oral Daily   Chlorhexidine  Gluconate Cloth  6 each Topical Q0600   clopidogrel   75 mg Oral Daily   collagenase    Topical Daily   vitamin B-12  1,000 mcg Oral Daily   donepezil   10 mg Oral QHS   entecavir   0.5 mg Oral Q72H   epoetin  alfa-epbx (RETACRIT ) injection  4,000 Units Intravenous Q T,Th,Sat-1800   folic acid   1 mg Oral Daily   gabapentin   200 mg Oral QHS   heparin   5,000 Units Subcutaneous Q8H   latanoprost   1 drop Both Eyes QHS   memantine   5 mg Oral BID   senna-docusate  2 tablet Oral BID   simvastatin   10 mg Oral QHS   hydrALAZINE , HYDROmorphone  (DILAUDID ) injection, HYDROmorphone , midodrine , ondansetron  (ZOFRAN ) IV, polyethylene glycol  Assessment/ Plan:  [name] is a 73 y.o.  male with  past medical conditions including hypertension, GERD, dementia, CAD, and end-stage renal disease on hemodialysis.  Patient presents to the emergency department for evaluation of foot wound and pain and has been admitted for Foot ulcer (HCC) [L97.509] Right foot infection [L08.9]   End stage renal disease on hemodialysis.  Receiving scheduled dialysis today, UF goal 2L as tolerated. Next treatment scheduled for Tuesday   2. Anemia of chronic kidney disease Lab Results  Component Value Date   HGB 7.2 (L) 06/17/2024  Hgb 7.2, Primary team has ordered blood transfusion with dialysis.   3. Secondary Hyperparathyroidism: with outpatient labs:none available  Lab Results  Component Value Date   PTH 101 (H) 07/26/2018   CALCIUM  8.4 (L) 06/17/2024   CAION 1.10 (L) 06/14/2024   PHOS 3.7 06/17/2024    Calcium  and phos stable  4. Hypotension with chronic kidney disease. Prescribed Midodrine  outpatient. Blood pressure stable during dialysis  5. 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 Has received Vancomycin  and Zosyn  during this admission.    LOS: 11 Ayomide Zuleta 12/6/202511:26 AM

## 2024-06-17 NOTE — Progress Notes (Signed)
 Progress Note   Patient: Anthony Houston FMW:[id number] DOB: 10/16/1950 DOA: 06/05/2024     11 DOS: the patient was seen and examined on 06/13/2024   Brief hospital course: Anthony Houston is a 73 y.o. male with medical history significant of HTN, CAD, GERD, dementia, ESRD-HD (TTS), GIST, HBV, who presents with foot wound and foot pain.  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  Patient had angioplasty and drug-eluting stent placed on 11/16.  Continued on antibiotics.   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 S/p BKA done on 12/3 # Peripheral arterial disease. S/p Angioplasty and stent  MRI showing findings suggestive of acute osteomyelitis of the navicular, middle and medial cuneiform bones.  Continue empiric antibiotics with vancomycin  and Zosyn  Discussed with vascular surgery, patient had angioplasty and drug-eluting stent placed. Wound culture grew Pseudomonas, Klebsiella as well as Enterobacter. Vascular surgery Rec BKA.  Vancomycin  discontinued.  Continue antibiotics with Zosyn . 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 3 times daily scheduled, Dilaudid  p.o. as needed and Dilaudid  IV for breakthrough pain Gabapentin  200 mg p.o. nightly First dressing changed by vascular surgery will be done on Saturday 12/6, can be discharged to SNF if no complications.   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 # Anemia of end-stage renal disease. Continue dialysis per nephrology. Received sodium phosphate   11/27.   12/6 Hb  7.2, transfuse 1 unit PRBC Monitor H&H and transfuse hemoglobin less than 7   # Hyperkalemia 12/3 Lokelma  x 1 dose given Monitor chemistry daily 12/4 scheduled for hemodialysis today   # Hypertension: Continue Coreg  and as needed hydralazine    # CAD (coronary artery disease): Continue Zocor  and aspirin    # Diarrhea: Condition seem to be improved, not able to collect stool   # Dementia: No behavior disturbance -Donepezil  and Namenda   # Folic acid  deficiency, folate level 4.3, started folic acid  1 mg p.o. daily.  Follow with PCP to repeat folic acid  level in between 3 to 6 months. # Vitamin B12 level 319, goal >400.  Started vitamin B12 oral supplement.  Follow with PCP to repeat B12 level in between 3 to 6 months.  # Vitamin D  level 33, at lower end, started Calcitrol 0.25 mg p.o. daily to prevent deficiency.  F/u with PCP to Repeat vitamin D  level in between 3 to 6 months.  Body mass index is 26.26 kg/m.   Subjective:  No significant overnight event.  Patient was seen during hemodialysis, resting comfortably, denied any active issues. Pain is well controlled.    Physical Exam: Vitals:   06/17/24 1122 06/17/24 1130 06/17/24 1158 06/17/24 1200  BP: 126/65 125/68 (!) 140/72 126/73  Pulse:  70 66 67  Resp:  19 19 18   Temp: 98.1 F (36.7 C)  98.3 F (36.8 C)   TempSrc: Oral  Oral   SpO2:  96% 100% 97%  Weight:    90.3 kg  Height:       General exam: Appears calm and comfortable  Respiratory system: Clear to auscultation. Respiratory effort normal. Cardiovascular system: S1 & S2 heard, RRR. No JVD, murmurs, rubs, gallops or clicks. No pedal edema. Gastrointestinal system: BS +, Abdomen is nondistended, soft and nontender. Central nervous system: Alert and oriented x2. No focal neurological deficits. Extremities: s/p Right BKA, dressing CDI Skin: No rashes, lesions or ulcers Psychiatry: Judgement and insight appear normal. Mood & affect appropriate.     Data Reviewed:  Lab results reviewed  Family Communication: None  Disposition: Status is: Inpatient Remains inpatient appropriate because: severity of disease, iv antibiotics and s/p right BKA done on 12/3, first dressing will be changed on 12/6 by vascular surgery and then patient can be discharged to SNF Follow-up TOC for SNF placement     Time spent: 35 minutes  Author: Elvan Sor, MD 06/17/2024 2:23 PM  For on call review [email or web address].

## 2024-06-18 LAB — BPAM RBC
Blood Product Expiration Date: 202601042359
ISSUE DATE / TIME: 202512061033
Unit Type and Rh: 202601042359
Unit Type and Rh: 5100

## 2024-06-18 LAB — TYPE AND SCREEN
ABO/RH(D): B POS
Antibody Screen: NEGATIVE
Unit division: 0

## 2024-06-18 LAB — BASIC METABOLIC PANEL WITH GFR
Anion gap: 10 (ref 5–15)
BUN: 26 mg/dL — ABNORMAL HIGH (ref 8–23)
CO2: 28 mmol/L (ref 22–32)
Calcium: 8.5 mg/dL — ABNORMAL LOW (ref 8.9–10.3)
Chloride: 95 mmol/L — ABNORMAL LOW (ref 98–111)
Creatinine, Ser: 5.46 mg/dL — ABNORMAL HIGH (ref 0.61–1.24)
GFR, Estimated: 10 mL/min — ABNORMAL LOW (ref >60)
Glucose, Bld: 98 mg/dL (ref 70–99)
Potassium: 4.1 mmol/L (ref 3.5–5.1)
Sodium: 133 mmol/L — ABNORMAL LOW (ref 135–145)

## 2024-06-18 LAB — CBC
HCT: 29.2 % — ABNORMAL LOW (ref 39.0–52.0)
Hemoglobin: 9.6 g/dL — ABNORMAL LOW (ref 13.0–17.0)
MCH: 28.6 pg (ref 26.0–34.0)
MCHC: 32.9 g/dL (ref 30.0–36.0)
MCV: 86.9 fL (ref 80.0–100.0)
Platelets: 346 K/uL (ref 150–400)
RBC: 3.36 MIL/uL — ABNORMAL LOW (ref 4.22–5.81)
RDW: 15.9 % — ABNORMAL HIGH (ref 11.5–15.5)
WBC: 10.5 K/uL (ref 4.0–10.5)
nRBC: 0.2 % (ref 0.0–0.2)

## 2024-06-18 LAB — GLUCOSE, CAPILLARY: Glucose-Capillary: 97 mg/dL (ref 70–99)

## 2024-06-18 NOTE — Progress Notes (Addendum)
 Physical Therapy Treatment Patient Details Name: Anthony Houston MRN: [id number] DOB: [birth year] Today's Date: 06/18/2024   History of Present Illness Anthony Houston is a 73 y.o. male with medical history significant of HTN, CAD, GERD, dementia, ESRD-HD (TTS), GIST, HBV, who presents with foot wound and foot pain. MRI suggestive of acute osteomyelitis of the navicular, middle and medial cuneiform bones. 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    PT Comments  Pt ready for session.  He is assisted to EOB with bed features and mod a x 1.  Is unable to scoot hips to EOB on his own and does need heavier assist to get fully to EOB.  Sitting balance is poor today with tactile to min a at  times for verbal and tactile cues to correct.  He is unable to lateral scoot left/right EOB.  Herby is tried for sit to stand transfer to try to transition to chair.  Despite pulling on bar or pushing off bed, he is unable to clear hips off of the bed despite max a x 2.  Pt is returned to supine due to fatigue where he is able to pull himself up in bed today using headboard.   Supine and seated AAROM for RLE.  Pt needs frequent verbal and tactile cues to continue with activity.  Pt continues to struggle with mobility skills.  Will benefit from further sitting balance and general ex skills before further attempts at [facility] lift.  Given general weakness and being unable to stand from elevated bed height, he would be unable to stand from lower height chair at this time and Deitra lift would be appropriate for OOB activities.  He remains motivated and agreeable for therapy sessions.  Pt is appropriate for <3 hrs a day therapy at discharge.  Pt does seem to have some cognitive/processing challenges which does seem to be affecting his ability to follow cues during therapy session.   If plan is discharge home, recommend the following: Two people to help with walking and/or transfers;A lot of help with walking  and/or transfers;A lot of help with bathing/dressing/bathroom;Two people to help with bathing/dressing/bathroom   Can travel by private vehicle        Equipment Recommendations       Recommendations for Other Services       Precautions / Restrictions Precautions Precautions: Fall Recall of Precautions/Restrictions: Impaired Restrictions Weight Bearing Restrictions Per Provider Order: Yes Other Position/Activity Restrictions: R BKA     Mobility  Bed Mobility Overal bed mobility: Needs Assistance Bed Mobility: Supine to Sit, Sit to Supine     Supine to sit: Mod assist, HOB elevated, Used rails Sit to supine: Mod assist   General bed mobility comments: able to pull himself up in bed today with headboard and cues    Transfers Overall transfer level: Needs assistance   Transfers: Sit to/from Stand Sit to Stand: Total assist           General transfer comment: unable to stand from elevated bed despite max a, unable lateral scoot - dependant.    Ambulation/Gait               General Gait Details: unable   Stairs             Wheelchair Mobility     Tilt Bed    Modified Rankin (Stroke Patients Only)       Balance Overall balance assessment: Needs assistance Sitting-balance support: Feet supported, No upper extremity  supported Sitting balance-Leahy Scale: Poor Sitting balance - Comments: +1 assist for verbal and tactile cues.  mod a at times to correct due to post right lean     Standing balance-Leahy Scale: Zero Standing balance comment: NT - unable                            Communication Communication Communication: Impaired Factors Affecting Communication: Difficulty expressing self  Cognition Arousal: Alert Behavior During Therapy: Flat affect   PT - Cognitive impairments: No family/caregiver present to determine baseline, Difficult to assess                         Following commands: Impaired Following  commands impaired: Follows one step commands with increased time, Follows multi-step commands inconsistently    Cueing Cueing Techniques: Verbal cues, Gestural cues, Visual cues  Exercises Other Exercises Other Exercises: seated and supine AAROM    General Comments        Pertinent Vitals/Pain Pain Assessment Pain Assessment: No/denies pain    Home Living                          Prior Function            PT Goals (current goals can now be found in the care plan section) Progress towards PT goals: Progressing toward goals    Frequency    Min 2X/week      PT Plan      Co-evaluation              AM-PAC PT 6 Clicks Mobility   Outcome Measure  Help needed turning from your back to your side while in a flat bed without using bedrails?: A Little Help needed moving from lying on your back to sitting on the side of a flat bed without using bedrails?: A Lot Help needed moving to and from a bed to a chair (including a wheelchair)?: Total Help needed standing up from a chair using your arms (e.g., wheelchair or bedside chair)?: Total Help needed to walk in hospital room?: Total Help needed climbing 3-5 steps with a railing? : Total 6 Click Score: 9    End of Session Equipment Utilized During Treatment: Gait belt Activity Tolerance: Patient tolerated treatment well Patient left: in bed;with call bell/phone within reach;with bed alarm set Nurse Communication: Mobility status;Need for lift equipment PT Visit Diagnosis: Muscle weakness (generalized) (M62.81);Difficulty in walking, not elsewhere classified (R26.2);Other abnormalities of gait and mobility (R26.89)     Time: 8853-8797 PT Time Calculation (min) (ACUTE ONLY): 16 min  Charges:    $Therapeutic Exercise: 8-22 mins PT General Charges $$ ACUTE PT VISIT: 1 Visit                   Lauraine Gills, PTA 06/18/24, 12:45 PM

## 2024-06-18 NOTE — Progress Notes (Signed)
 [facility]  ROUNDING NOTE   Subjective:   Anthony Houston is a 73 year old male with past medical conditions including hypertension, GERD, dementia, CAD, and end-stage renal disease on hemodialysis.  Patient presents to the emergency department for evaluation of foot wound and pain and has been admitted for Foot ulcer (HCC) [L97.509] Right foot infection [L08.9]  Patient is known to our practice and receives outpatient dialysis treatments at [facility] on a TTS schedule, supervised by Dr. Dennise.    Update:   Patient seen sitting up in bed Denies shortness of breath  Objective:  Vital signs in last 24 hours:  Temp:  [97.5 F (36.4 C)-99.2 F (37.3 C)] 97.5 F (36.4 C) (12/07 0733) Pulse Rate:  [66-75] 70 (12/07 0733) Resp:  [17-19] 17 (12/07 0733) BP: (107-140)/(63-73) 117/63 (12/07 0733) SpO2:  [96 %-100 %] 97 % (12/07 0733) Weight:  [90.3 kg] 90.3 kg (12/06 1200)  Weight change:  Filed Weights   06/13/24 1128 06/17/24 0804 06/17/24 1200  Weight: 90.4 kg 91.3 kg 90.3 kg    Intake/Output: I/O last 3 completed shifts: In: 629.5 [P.O.:100; Blood:529.5] Out: 1000 [Other:1000]   Intake/Output this shift:  Total I/O In: 30 [P.O.:30] Out: -   Physical Exam: General: NAD  Head: Normocephalic, atraumatic. Moist oral mucosal membranes  Eyes: Anicteric  Lungs:  Clear to auscultation, normal effort  Heart: Regular rate and rhythm  Abdomen:  Soft, nontender  Extremities:  No peripheral edema.  Neurologic: Awake, alert  Skin: Warm,dry, Right lBKA  Access: Lt AVF    Basic Metabolic Panel: Recent Labs  Lab 06/13/24 0740 06/14/24 1138 06/14/24 1448 06/15/24 0755 06/16/24 0542 06/17/24 0311 06/18/24 0329  NA 133* 132*  --  130* 132* 132* 133*  K 5.0 5.0 5.4* 5.3* 4.8 5.3* 4.1  CL 93* 96*  --  91* 93* 94* 95*  CO2 25  --   --  23 27 22 28   GLUCOSE 81 82  --  95 97 73 98  BUN 51* 33*  --  43* 27* 31* 26*  CREATININE 8.37* 6.80*  --   7.46* 5.47* 7.39* 5.46*  CALCIUM  8.5*  --   --  8.7* 8.4* 8.4* 8.5*  MG  --   --   --  2.2 2.0 2.2  --   PHOS 3.4  --   --  3.1 2.9 3.7  --     Liver Function Tests: Recent Labs  Lab 06/13/24 0740  ALBUMIN 3.0*   No results for input(s): LIPASE, AMYLASE in the last 168 hours. No results for input(s): AMMONIA in the last 168 hours.  CBC: Recent Labs  Lab 06/12/24 0800 06/13/24 0740 06/14/24 1138 06/15/24 0755 06/16/24 0542 06/17/24 0830 06/18/24 0329  WBC 6.3 7.0  --  10.8* 8.2 9.3 10.5  NEUTROABS 3.5  --   --   --   --   --   --   HGB 9.3* 8.8* 10.9* 8.9* 8.0* 7.2* 9.6*  HCT 28.4* 26.8* 32.0* 27.4* 24.9* 22.5* 29.2*  MCV 85.0 84.8  --  85.4 85.9 87.5 86.9  PLT 273 260  --  275 301 319 346    Cardiac Enzymes: No results for input(s): CKTOTAL, CKMB, CKMBINDEX, TROPONINI in the last 168 hours.  BNP: Invalid input(s): POCBNP  CBG: Recent Labs  Lab 06/15/24 0726 06/16/24 0809 06/16/24 2108 06/17/24 0727 06/18/24 0734  GLUCAP 94 94 91 128* 97    Microbiology: Results for orders placed or performed during  the hospital encounter of 06/05/24  Culture, blood (single) w Reflex to ID Panel     Status: None   Collection Time: 06/05/24  4:58 PM   Specimen: BLOOD  Result Value Ref Range Status   Specimen Description BLOOD LEFT ANTECUBITAL  Final   Special Requests   Final    BOTTLES DRAWN AEROBIC AND ANAEROBIC Blood Culture adequate volume   Culture   Final    NO GROWTH 5 DAYS Performed at [facility], [address], [place], [place] [address]    Report Status 06/11/2024 FINAL  Final  Culture, blood (Routine X 2) w Reflex to ID Panel     Status: None   Collection Time: 06/05/24  9:39 PM   Specimen: BLOOD  Result Value Ref Range Status   Specimen Description BLOOD BLOOD RIGHT ARM  Final   Special Requests   Final    BOTTLES DRAWN AEROBIC AND ANAEROBIC Blood Culture adequate volume   Culture   Final    NO GROWTH 5 DAYS Performed at  [facility], [address], [place], [place] [address]    Report Status 06/11/2024 FINAL  Final  Aerobic Culture w Gram Stain (superficial specimen)     Status: None   Collection Time: 06/05/24  9:51 PM   Specimen: Wound  Result Value Ref Range Status   Specimen Description   Final    WOUND Performed at [facility], [address] [place], [place] [address]    Special Requests   Final    NONE Performed at [facility], [address] [place], [place] [address]    Gram Stain   Final    RARE WBC PRESENT, PREDOMINANTLY PMN RARE GRAM NEGATIVE RODS RARE GRAM POSITIVE COCCI Performed at [facility], [address], [place], [place] [address]    Culture   Final    ABUNDANT PSEUDOMONAS AERUGINOSA ABUNDANT ENTEROBACTER CLOACAE MODERATE KLEBSIELLA PNEUMONIAE    Report Status 06/10/2024 FINAL  Final   Organism ID, Bacteria ENTEROBACTER CLOACAE  Final   Organism ID, Bacteria KLEBSIELLA PNEUMONIAE  Final   Organism ID, Bacteria PSEUDOMONAS AERUGINOSA  Final      Susceptibility   Enterobacter cloacae - MIC*    CEFEPIME  <=0.12 SENSITIVE Sensitive     ERTAPENEM <=0.12 SENSITIVE Sensitive     CIPROFLOXACIN <=0.06 SENSITIVE Sensitive     GENTAMICIN <=1 SENSITIVE Sensitive     MEROPENEM <=0.25 SENSITIVE Sensitive     TRIMETH/SULFA <=20 SENSITIVE Sensitive     PIP/TAZO Value in next row Sensitive      <=4 SENSITIVEThis is a modified FDA-approved test that has been validated and its performance characteristics determined by the reporting laboratory.  This laboratory is certified under the Clinical Laboratory Improvement Amendments CLIA as qualified to perform high complexity clinical laboratory testing.    * ABUNDANT ENTEROBACTER CLOACAE   Klebsiella pneumoniae - MIC*    AMPICILLIN Value in next row Resistant      <=4 SENSITIVEThis is a modified FDA-approved test that has been validated and its performance characteristics determined by the reporting  laboratory.  This laboratory is certified under the Clinical Laboratory Improvement Amendments CLIA as qualified to perform high complexity clinical laboratory testing.    CEFAZOLIN  (NON-URINE) Value in next row Sensitive      <=4 SENSITIVEThis is a modified FDA-approved test that has been validated and its performance characteristics determined by the reporting laboratory.  This laboratory is certified under the Clinical Laboratory Improvement Amendments  CLIA as qualified to perform high complexity clinical laboratory testing.    CEFEPIME  Value in next row Sensitive      <=4 SENSITIVEThis is a modified FDA-approved test that has been validated and its performance characteristics determined by the reporting laboratory.  This laboratory is certified under the Clinical Laboratory Improvement Amendments CLIA as qualified to perform high complexity clinical laboratory testing.    ERTAPENEM Value in next row Sensitive      <=4 SENSITIVEThis is a modified FDA-approved test that has been validated and its performance characteristics determined by the reporting laboratory.  This laboratory is certified under the Clinical Laboratory Improvement Amendments CLIA as qualified to perform high complexity clinical laboratory testing.    CEFTRIAXONE  Value in next row Sensitive      <=4 SENSITIVEThis is a modified FDA-approved test that has been validated and its performance characteristics determined by the reporting laboratory.  This laboratory is certified under the Clinical Laboratory Improvement Amendments CLIA as qualified to perform high complexity clinical laboratory testing.    CIPROFLOXACIN Value in next row Sensitive      <=4 SENSITIVEThis is a modified FDA-approved test that has been validated and its performance characteristics determined by the reporting laboratory.  This laboratory is certified under the Clinical Laboratory Improvement Amendments CLIA as qualified to perform high complexity clinical  laboratory testing.    GENTAMICIN Value in next row Sensitive      <=4 SENSITIVEThis is a modified FDA-approved test that has been validated and its performance characteristics determined by the reporting laboratory.  This laboratory is certified under the Clinical Laboratory Improvement Amendments CLIA as qualified to perform high complexity clinical laboratory testing.    MEROPENEM Value in next row Sensitive      <=4 SENSITIVEThis is a modified FDA-approved test that has been validated and its performance characteristics determined by the reporting laboratory.  This laboratory is certified under the Clinical Laboratory Improvement Amendments CLIA as qualified to perform high complexity clinical laboratory testing.    TRIMETH/SULFA Value in next row Sensitive      <=4 SENSITIVEThis is a modified FDA-approved test that has been validated and its performance characteristics determined by the reporting laboratory.  This laboratory is certified under the Clinical Laboratory Improvement Amendments CLIA as qualified to perform high complexity clinical laboratory testing.    AMPICILLIN/SULBACTAM Value in next row Sensitive      <=4 SENSITIVEThis is a modified FDA-approved test that has been validated and its performance characteristics determined by the reporting laboratory.  This laboratory is certified under the Clinical Laboratory Improvement Amendments CLIA as qualified to perform high complexity clinical laboratory testing.    PIP/TAZO Value in next row Sensitive      <=4 SENSITIVEThis is a modified FDA-approved test that has been validated and its performance characteristics determined by the reporting laboratory.  This laboratory is certified under the Clinical Laboratory Improvement Amendments CLIA as qualified to perform high complexity clinical laboratory testing.    * MODERATE KLEBSIELLA PNEUMONIAE   Pseudomonas aeruginosa - MIC*    MEROPENEM Value in next row Sensitive      <=4 SENSITIVEThis is  a modified FDA-approved test that has been validated and its performance characteristics determined by the reporting laboratory.  This laboratory is certified under the Clinical Laboratory Improvement Amendments CLIA as qualified to perform high complexity clinical laboratory testing.    CIPROFLOXACIN Value in next row Sensitive      <=4 SENSITIVEThis is a modified FDA-approved test that has been validated  and its performance characteristics determined by the reporting laboratory.  This laboratory is certified under the Clinical Laboratory Improvement Amendments CLIA as qualified to perform high complexity clinical laboratory testing.    IMIPENEM Value in next row Sensitive      <=4 SENSITIVEThis is a modified FDA-approved test that has been validated and its performance characteristics determined by the reporting laboratory.  This laboratory is certified under the Clinical Laboratory Improvement Amendments CLIA as qualified to perform high complexity clinical laboratory testing.    PIP/TAZO Value in next row Sensitive      16 SENSITIVEThis is a modified FDA-approved test that has been validated and its performance characteristics determined by the reporting laboratory.  This laboratory is certified under the Clinical Laboratory Improvement Amendments CLIA as qualified to perform high complexity clinical laboratory testing.    CEFEPIME  Value in next row Sensitive      16 SENSITIVEThis is a modified FDA-approved test that has been validated and its performance characteristics determined by the reporting laboratory.  This laboratory is certified under the Clinical Laboratory Improvement Amendments CLIA as qualified to perform high complexity clinical laboratory testing.    CEFTAZIDIME/AVIBACTAM Value in next row Sensitive      16 SENSITIVEThis is a modified FDA-approved test that has been validated and its performance characteristics determined by the reporting laboratory.  This laboratory is certified under  the Clinical Laboratory Improvement Amendments CLIA as qualified to perform high complexity clinical laboratory testing.    CEFTOLOZANE/TAZOBACTAM Value in next row Sensitive      16 SENSITIVEThis is a modified FDA-approved test that has been validated and its performance characteristics determined by the reporting laboratory.  This laboratory is certified under the Clinical Laboratory Improvement Amendments CLIA as qualified to perform high complexity clinical laboratory testing.    TOBRAMYCIN Value in next row Sensitive      16 SENSITIVEThis is a modified FDA-approved test that has been validated and its performance characteristics determined by the reporting laboratory.  This laboratory is certified under the Clinical Laboratory Improvement Amendments CLIA as qualified to perform high complexity clinical laboratory testing.    CEFTAZIDIME Value in next row Sensitive      16 SENSITIVEThis is a modified FDA-approved test that has been validated and its performance characteristics determined by the reporting laboratory.  This laboratory is certified under the Clinical Laboratory Improvement Amendments CLIA as qualified to perform high complexity clinical laboratory testing.    * ABUNDANT PSEUDOMONAS AERUGINOSA  Culture, blood (Routine X 2) w Reflex to ID Panel     Status: None   Collection Time: 06/05/24 11:25 PM   Specimen: BLOOD  Result Value Ref Range Status   Specimen Description BLOOD RIGHT ANTECUBITAL  Final   Special Requests   Final    BOTTLES DRAWN AEROBIC AND ANAEROBIC Blood Culture adequate volume   Culture   Final    NO GROWTH 5 DAYS Performed at [facility], [address], [place], [place] [address]    Report Status 06/10/2024 FINAL  Final  MRSA Next Gen by PCR, Nasal     Status: None   Collection Time: 06/09/24  8:11 AM   Specimen: Nasal Mucosa; Nasal Swab  Result Value Ref Range Status   MRSA by PCR Next Gen NOT DETECTED NOT DETECTED Final    Comment: (NOTE) The  GeneXpert MRSA Assay (FDA approved for NASAL specimens only), is one component of a comprehensive MRSA colonization surveillance program. It is not intended to diagnose MRSA infection nor  to guide or monitor treatment for MRSA infections. Test performance is not FDA approved in patients less than 63 years old. Performed at [facility], [address] [place], [place] [address]     Coagulation Studies: No results for input(s): LABPROT, INR in the last 72 hours.   Urinalysis: No results for input(s): COLORURINE, LABSPEC, PHURINE, GLUCOSEU, HGBUR, BILIRUBINUR, KETONESUR, PROTEINUR, UROBILINOGEN, NITRITE, LEUKOCYTESUR in the last 72 hours.  Invalid input(s): APPERANCEUR    Imaging: No results found.    Medications:      sodium chloride    Intravenous Once   acetaminophen   650 mg Oral TID   aspirin   81 mg Oral Daily   brimonidine   1 drop Both Eyes BID   And   timolol   1 drop Both Eyes BID   calcitRIOL   0.25 mcg Oral Daily   Chlorhexidine  Gluconate Cloth  6 each Topical Q0600   clopidogrel   75 mg Oral Daily   collagenase    Topical Daily   vitamin B-12  1,000 mcg Oral Daily   donepezil   10 mg Oral QHS   entecavir   0.5 mg Oral Q72H   epoetin  alfa-epbx (RETACRIT ) injection  4,000 Units Intravenous Q T,Th,Sat-1800   folic acid   1 mg Oral Daily   gabapentin   200 mg Oral QHS   heparin   5,000 Units Subcutaneous Q8H   latanoprost   1 drop Both Eyes QHS   memantine   5 mg Oral BID   senna-docusate  2 tablet Oral BID   simvastatin   10 mg Oral QHS   hydrALAZINE , HYDROmorphone  (DILAUDID ) injection, HYDROmorphone , midodrine , ondansetron  (ZOFRAN ) IV, polyethylene glycol  Assessment/ Plan:  [name] is a 73 y.o.  male with past medical conditions including hypertension, GERD, dementia, CAD, and end-stage renal disease on hemodialysis.  Patient presents to the emergency department for evaluation of foot wound and pain and has been  admitted for Foot ulcer (HCC) [L97.509] Right foot infection [L08.9]   End stage renal disease on hemodialysis.  Dialysis received yesterday, UF 1L achieved. Next treatment scheduled for Tuesday   2. Anemia of chronic kidney disease Lab Results  Component Value Date   HGB 9.6 (L) 06/18/2024  Hgb 9.6 after blood transfusion with dialysis. Continue retacrit  with dialysis.   3. Secondary Hyperparathyroidism: with outpatient labs:none available  Lab Results  Component Value Date   PTH 101 (H) 07/26/2018   CALCIUM  8.5 (L) 06/18/2024   CAION 1.10 (L) 06/14/2024   PHOS 3.7 06/17/2024   Bone minerals stable  4. Hypotension with chronic kidney disease. Prescribed Midodrine  outpatient. Blood pressure acceptable for this patient.  5. 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 Has received Vancomycin  and Zosyn  during this admission. PT/OT and pain management per primary team   LOS: 12 Bryker Fletchall 12/7/202511:39 AM

## 2024-06-18 NOTE — [place] FL2 (Signed)
   MEDICAID [id number] LEVEL OF CARE FORM     IDENTIFICATION  Patient Name: Anthony Houston Birthdate: 1951-06-18 Sex: male Admission Date (Current Location): 06/05/2024  [facility] and [place] Number:  Chiropodist and Address:  [facility], [address] [place], [place] [address]      Provider Number: [id number]  Attending Physician Name and Address:  Von Bellis, MD  Relative Name and Phone Number:  Orlando [employer], [employer])  [phone] (Home Phone)    Current Level of Care: Hospital Recommended Level of Care: Skilled Nursing Facility Prior Approval Number:    Date Approved/Denied:   PASRR Number:    Discharge Plan: SNF    Current Diagnoses: Patient Active Problem List   Diagnosis Date Noted   S/P BKA (below knee amputation) unilateral, right (HCC) 06/15/2024   Hypophosphatemia 06/08/2024   Right foot infection with wound 06/06/2024   Obesity (BMI 30-39.9) 06/06/2024   Anemia in ESRD (end-stage renal disease) (HCC) 06/06/2024   Diarrhea 06/06/2024   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) 06/06/2024   Acute metabolic encephalopathy 07/06/2023   Sepsis (HCC) 07/06/2023   Vomiting 07/06/2023   Acute respiratory failure with hypoxia (HCC) 12/17/2022   Tobacco abuse counseling 12/17/2022   Dementia (HCC) 12/17/2022   Chronic hepatitis B (HCC) 12/17/2022   Open wound of left foot 12/17/2022   Hypertension 07/26/2018   CAD (coronary artery disease) 07/26/2018   Weakness 07/26/2018   ESRD on hemodialysis (HCC) 07/26/2018    Orientation RESPIRATION BLADDER Height & Weight     Self, Time, Place  Normal Continent Weight: 199 lb 1.2 oz (90.3 kg) Height:  6' 1 (185.4 cm)  BEHAVIORAL SYMPTOMS/MOOD NEUROLOGICAL BOWEL NUTRITION STATUS      Continent Diet  AMBULATORY STATUS COMMUNICATION OF NEEDS Skin   Extensive Assist Verbally Normal, Surgical wounds                       Personal Care Assistance Level of  Assistance  Bathing, Dressing Bathing Assistance: Maximum assistance   Dressing Assistance: Maximum assistance     Functional Limitations Info             SPECIAL CARE FACTORS FREQUENCY  PT (By licensed PT), OT (By licensed OT)     PT Frequency: 2x OT Frequency: 2x            Contractures Contractures Info: Not present    Additional Factors Info  Code Status, Allergies Code Status Info: FULL Allergies Info: Lisinopril           Current Medications (06/18/2024):  This is the current hospital active medication list Current Facility-Administered Medications  Medication Dose Route Frequency Provider Last Rate Last Admin   0.9 %  sodium chloride  infusion (Manually program via Guardrails IV Fluids)   Intravenous Once Von Bellis, MD       acetaminophen  (TYLENOL ) tablet 650 mg  650 mg Oral TID Von Bellis, MD   650 mg at 06/18/24 1047   aspirin  chewable tablet 81 mg  81 mg Oral Daily Dew, Jason S, MD   81 mg at 06/18/24 1047   brimonidine  (ALPHAGAN ) 0.2 % ophthalmic solution 1 drop  1 drop Both Eyes BID Dew, Jason S, MD   1 drop at 06/18/24 1052   And   timolol  (TIMOPTIC ) 0.5 % ophthalmic solution 1 drop  1 drop Both Eyes BID Marea Selinda RAMAN, MD   1 drop at 06/18/24 1048   calcitRIOL  (  ROCALTROL ) capsule 0.25 mcg  0.25 mcg Oral Daily Von Bellis, MD   0.25 mcg at 06/18/24 1047   Chlorhexidine  Gluconate Cloth 2 % PADS 6 each  6 each Topical Q0600 Dew, Jason S, MD   6 each at 06/18/24 0516   clopidogrel  (PLAVIX ) tablet 75 mg  75 mg Oral Daily Dew, Jason S, MD   75 mg at 06/18/24 1047   collagenase  (SANTYL ) ointment   Topical Daily Dew, Jason S, MD   Given at 06/12/24 1615   cyanocobalamin  (VITAMIN B12) tablet 1,000 mcg  1,000 mcg Oral Daily Von Bellis, MD   1,000 mcg at 06/18/24 1047   donepezil  (ARICEPT ) tablet 10 mg  10 mg Oral QHS Dew, Jason S, MD   10 mg at 06/17/24 2157   entecavir  (BARACLUDE ) tablet 0.5 mg  0.5 mg Oral Q72H Dew, Jason S, MD   0.5 mg at 06/12/24 1227    epoetin  alfa-epbx (RETACRIT ) injection 4,000 Units  4,000 Units Intravenous Q T,Th,Sat-1800 Dew, Jason S, MD   4,000 Units at 06/17/24 0901   folic acid  (FOLVITE ) tablet 1 mg  1 mg Oral Daily Von Bellis, MD   1 mg at 06/18/24 1047   gabapentin  (NEURONTIN ) capsule 200 mg  200 mg Oral QHS Von Bellis, MD   200 mg at 06/17/24 2157   heparin  injection 5,000 Units  5,000 Units Subcutaneous Q8H Dew, Jason S, MD   5,000 Units at 06/17/24 2157   hydrALAZINE  (APRESOLINE ) injection 10 mg  10 mg Intravenous Q4H PRN Von Bellis, MD       HYDROmorphone  (DILAUDID ) injection 1 mg  1 mg Intravenous Q4H PRN Von Bellis, MD       HYDROmorphone  (DILAUDID ) tablet 2 mg  2 mg Oral Q4H PRN Von Bellis, MD       latanoprost  (XALATAN ) 0.005 % ophthalmic solution 1 drop  1 drop Both Eyes QHS Dew, Jason S, MD   1 drop at 06/17/24 2200   memantine  (NAMENDA ) tablet 5 mg  5 mg Oral BID Dew, Jason S, MD   5 mg at 06/18/24 1047   midodrine  (PROAMATINE ) tablet 5 mg  5 mg Oral TID PRN Von Bellis, MD       ondansetron  (ZOFRAN ) injection 4 mg  4 mg Intravenous Q8H PRN Dew, Jason S, MD       polyethylene glycol (MIRALAX  / GLYCOLAX ) packet 17 g  17 g Oral Daily PRN Dew, Jason S, MD   17 g at 06/12/24 2120   senna-docusate (Senokot-S) tablet 2 tablet  2 tablet Oral BID Dew, Jason S, MD   2 tablet at 06/18/24 1047   simvastatin  (ZOCOR ) tablet 10 mg  10 mg Oral QHS Dew, Jason S, MD   10 mg at 06/17/24 2157     Discharge Medications: Please see discharge summary for a list of discharge medications.  Relevant Imaging Results:  Relevant Lab Results:   Additional Information Outpatient HD  Jonerik Sliker L Moncerrat Burnstein, LCSW

## 2024-06-18 NOTE — TOC Progression Note (Addendum)
 Transition of Care [facility]) - Progression Note    Patient Details  Name: Anthony Houston MRN: [id number] Date of Birth: [birth year]  Transition of Care [facility]) CM/SW Contact  Hicks Feick L Mikeyla Music, [place] Phone Number: 06/18/2024, 1:26 PM  Clinical Narrative:     CSW met with patient. SNF recommendations discussed. Patient agreeable to go to SNF before returning to his ALF. CSW contacted patient brother, Mr. Orlando to discuss. Brother agreed with recommendations and advised that he makes all decisions for his brother. FL2 completed and bed search initiated.   3:10pm: Bed offer received from [employer]. CSW received a call from [facility], advising that there is bed availability and patient can discharge tomorrow. Massie called the brother to get approval.   CSW also called the brother and he was accepting of the bed offer. No auth needed. Patient can discharge tomorrow.   Expected Discharge Plan: Skilled Nursing Facility Barriers to Discharge: Continued Medical Work up               Expected Discharge Plan and Services                                               Social Drivers of Health (SDOH) Interventions SDOH Screenings   Food Insecurity: Patient Unable To Answer (06/06/2024)  Housing: Unknown (06/06/2024)  Transportation Needs: Patient Unable To Answer (06/06/2024)  Utilities: Patient Unable To Answer (06/06/2024)  Social Connections: Patient Unable To Answer (06/06/2024)  Tobacco Use: Low Risk  (06/14/2024)    Readmission Risk Interventions     No data to display

## 2024-06-18 NOTE — Progress Notes (Signed)
      Daily Progress Note   Assessment/Planning:   POD #4 s/p R BKA  Pt not on any vascular list so was not seen on rounds Viable stump PT/OT/IPR Follow up in office in 4 weeks with Dr. Marea   Subjective  - 4 Days Post-Op   No pain   Objective   Vitals:   06/17/24 1620 06/17/24 2123 06/18/24 0543 06/18/24 0733  BP: 138/65 124/67 107/73 117/63  Pulse: 72 75 70 70  Resp: 17 17 17 17   Temp: 97.6 F (36.4 C) 99.2 F (37.3 C) 98.7 F (37.1 C) (!) 97.5 F (36.4 C)  TempSrc:      SpO2: 97% 99% 96% 97%  Weight:      Height:         Intake/Output Summary (Last 24 hours) at 06/18/2024 1141 Last data filed at 06/18/2024 1013 Gross per 24 hour  Intake 130 ml  Output 1000 ml  Net -870 ml    VASC R BKA: viable stump, blood on Xeroform, no active bleeding    Laboratory   CBC    Latest Ref Rng & Units 06/18/2024    3:29 AM 06/17/2024    8:30 AM 06/16/2024    5:42 AM  CBC  WBC 4.0 - 10.5 K/uL 10.5  9.3  8.2   Hemoglobin 13.0 - 17.0 g/dL 9.6  7.2  8.0   Hematocrit 39.0 - 52.0 % 29.2  22.5  24.9   Platelets 150 - 400 K/uL 346  319  301     BMET    Component Value Date/Time   NA 133 (L) 06/18/2024 0329   NA 141 04/02/2012 0955   K 4.1 06/18/2024 0329   K 3.8 04/02/2012 0955   CL 95 (L) 06/18/2024 0329   CL 105 04/02/2012 0955   CO2 28 06/18/2024 0329   CO2 24 04/02/2012 0955   GLUCOSE 98 06/18/2024 0329   GLUCOSE 161 (H) 04/02/2012 0955   BUN 26 (H) 06/18/2024 0329   BUN 14 04/02/2012 0955   CREATININE 5.46 (H) 06/18/2024 0329   CREATININE 1.65 (H) 04/02/2012 0955   CALCIUM  8.5 (L) 06/18/2024 0329   CALCIUM  9.3 04/02/2012 0955   GFRNONAA 10 (L) 06/18/2024 0329   GFRNONAA 44 (L) 04/02/2012 0955   GFRAA 9 (L) 05/04/2019 2344   GFRAA 50 (L) 04/02/2012 0955     Redell Door, MD, FACS, FSVS Covering for [facility]: [phone]  06/18/2024, 11:41 AM

## 2024-06-18 NOTE — Progress Notes (Signed)
 Progress Note   Patient: Anthony Houston FMW:[id number] DOB: [birth year] DOA: 06/05/2024     12 DOS: the patient was seen and examined on 06/13/2024   Brief hospital course: Anthony Houston is a 73 y.o. male with medical history significant of HTN, CAD, GERD, dementia, ESRD-HD (TTS), GIST, HBV, who presents with foot wound and foot pain.  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  Patient had angioplasty and drug-eluting stent placed on 11/16.  Continued on antibiotics.   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 S/p BKA done on 12/3 # Peripheral arterial disease. S/p Angioplasty and stent  MRI showing findings suggestive of acute osteomyelitis of the navicular, middle and medial cuneiform bones.  Continue empiric antibiotics with vancomycin  and Zosyn  Discussed with vascular surgery, patient had angioplasty and drug-eluting stent placed. Wound culture grew Pseudomonas, Klebsiella as well as Enterobacter. Vascular surgery Rec BKA.  Vancomycin  discontinued.  Continue antibiotics with Zosyn . 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 3 times daily scheduled, Dilaudid  p.o. as needed and Dilaudid  IV for breakthrough pain Gabapentin  200 mg p.o. nightly First dressing changed by vascular surgery will be done on Saturday 12/6, can be discharged to SNF if no complications. 12/7 first dressing was changed by vascular surgery, staples intact, wound is healing well.    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 #  Anemia of end-stage renal disease. Continue dialysis per nephrology. Received sodium phosphate  11/27.   12/6 Hb  7.2, transfuse 1 unit PRBC 12/7 Hb 9.6, posttransfusion Monitor H&H and transfuse hemoglobin less than 7   # Hyperkalemia 12/3 Lokelma  x 1 dose given Monitor chemistry daily 12/4 scheduled for hemodialysis today   # Hypertension: Continue Coreg  and as needed hydralazine    # CAD (coronary artery disease): Continue Zocor  and aspirin    # Diarrhea: Condition seem to be improved, not able to collect stool   # Dementia: No behavior disturbance -Donepezil  and Namenda   # Folic acid  deficiency, folate level 4.3, started folic acid  1 mg p.o. daily.  Follow with PCP to repeat folic acid  level in between 3 to 6 months. # Vitamin B12 level 319, goal >400.  Started vitamin B12 oral supplement.  Follow with PCP to repeat B12 level in between 3 to 6 months.  # Vitamin D  level 33, at lower end, started Calcitrol 0.25 mg p.o. daily to prevent deficiency.  F/u with PCP to Repeat vitamin D  level in between 3 to 6 months.  Body mass index is 26.26 kg/m.   Subjective:  No significant overnight event.  Patient was resting comfortably in the bed without any discomfort, pain is under control.  Dressing was changed by vascular surgery   Physical Exam: Vitals:   06/17/24 1620 06/17/24 2123 06/18/24 0543 06/18/24 0733  BP: 138/65 124/67 107/73 117/63  Pulse: 72 75 70 70  Resp: 17 17 17 17   Temp: 97.6 F (36.4 C) 99.2 F (37.3 C) 98.7 F (37.1 C) (!) 97.5 F (36.4 C)  TempSrc:  SpO2: 97% 99% 96% 97%  Weight:      Height:       General exam: Appears calm and comfortable  Respiratory system: Clear to auscultation. Respiratory effort normal. Cardiovascular system: S1 & S2 heard, RRR. No JVD, murmurs, rubs, gallops or clicks. No pedal edema. Gastrointestinal system: BS +, Abdomen is nondistended, soft and nontender. Central nervous system: Alert and oriented x2. No focal  neurological deficits. Extremities: s/p Right BKA, staples intact, wound healing well, Feist dressing was changed on 12/7 Skin: No rashes, lesions or ulcers Psychiatry: Judgement and insight appear normal. Mood & affect appropriate.    Data Reviewed:  Lab results reviewed  Family Communication: None  Disposition: Status is: Inpatient Remains inpatient appropriate because: severity of disease, iv antibiotics and s/p right BKA done on 12/3, first dressing was changed on 12/7 by vascular surgery and then patient can be discharged to SNF Patient is from ALF, need to go to SNF Follow-up TOC for SNF placement     Time spent: 35 minutes  Author: Elvan Sor, MD 06/18/2024 1:26 PM  For on call review [email or web address].

## 2024-06-18 NOTE — Plan of Care (Signed)

## 2024-06-19 ENCOUNTER — Encounter: Payer: Self-pay | Admitting: Vascular Surgery

## 2024-06-19 LAB — CBC
HCT: 29.8 % — ABNORMAL LOW (ref 39.0–52.0)
Hemoglobin: 9.5 g/dL — ABNORMAL LOW (ref 13.0–17.0)
MCH: 28.2 pg (ref 26.0–34.0)
MCHC: 31.9 g/dL (ref 30.0–36.0)
MCV: 88.4 fL (ref 80.0–100.0)
Platelets: 341 K/uL (ref 150–400)
RBC: 3.37 MIL/uL — ABNORMAL LOW (ref 4.22–5.81)
RDW: 16.3 % — ABNORMAL HIGH (ref 11.5–15.5)
WBC: 8.5 K/uL (ref 4.0–10.5)
nRBC: 0.2 % (ref 0.0–0.2)

## 2024-06-19 LAB — BASIC METABOLIC PANEL WITH GFR
Anion gap: 13 (ref 5–15)
BUN: 45 mg/dL — ABNORMAL HIGH (ref 8–23)
CO2: 27 mmol/L (ref 22–32)
Calcium: 8.6 mg/dL — ABNORMAL LOW (ref 8.9–10.3)
Chloride: 94 mmol/L — ABNORMAL LOW (ref 98–111)
Creatinine, Ser: 7.81 mg/dL — ABNORMAL HIGH (ref 0.61–1.24)
GFR, Estimated: 7 mL/min — ABNORMAL LOW (ref >60)
Glucose, Bld: 90 mg/dL (ref 70–99)
Potassium: 4.5 mmol/L (ref 3.5–5.1)
Sodium: 134 mmol/L — ABNORMAL LOW (ref 135–145)

## 2024-06-19 MED ORDER — CYANOCOBALAMIN 1000 MCG PO TABS: 1000.0000 ug | ORAL_TABLET | Freq: Every day | ORAL | Status: AC

## 2024-06-19 MED ORDER — FOLIC ACID 1 MG PO TABS: 1.0000 mg | ORAL_TABLET | Freq: Every day | ORAL | Status: AC

## 2024-06-19 MED ORDER — CALCITRIOL 0.25 MCG PO CAPS: 0.2500 ug | ORAL_CAPSULE | Freq: Every day | ORAL | Status: AC

## 2024-06-19 MED ORDER — CLOPIDOGREL BISULFATE 75 MG PO TABS: 75.0000 mg | ORAL_TABLET | Freq: Every day | ORAL | Status: AC

## 2024-06-19 MED ORDER — HYDROMORPHONE HCL 2 MG PO TABS: 1.0000 mg | ORAL_TABLET | Freq: Four times a day (QID) | ORAL | 0 refills | Status: AC | PRN

## 2024-06-19 MED ORDER — IPRATROPIUM-ALBUTEROL 0.5-2.5 (3) MG/3ML IN SOLN: 3.0000 mL | Freq: Four times a day (QID) | RESPIRATORY_TRACT | Status: AC | PRN

## 2024-06-19 NOTE — Discharge Summary (Signed)
 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  Repeat CBC in 1 to 2 weeks to check hemoglobin. Follow with nephrology and continue hemodialysis TTS schedule. Follow-up with vascular surgery in 1 to 2 weeks for postop check Follow up LABS/TEST:  as above   Contact information for after-discharge care     Destination     [facility] .   Service: Skilled Nursing Contact information: [address] [place]  [address] [phone]                    Diet recommendation: Renal diet  Activity: The patient is advised to gradually reintroduce usual activities, as tolerated  Discharge Condition: stable  Code Status: Full code   History of present illness: As per the H and P dictated on admission.  Hospital Course:  Anthony Houston is a 73 y.o. male with medical history significant of HTN, CAD, GERD, dementia, ESRD-HD (TTS), GIST, HBV, who presents with foot wound and foot pain.  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  Patient had angioplasty and drug-eluting stent placed on 11/16.  Continued on antibiotics.    Assessment and Plan:   # Acute osteomyelitis of right foot. S/p BKA done on 12/3 #  Peripheral arterial disease. S/p Angioplasty and stent  MRI showing findings suggestive of acute osteomyelitis of the navicular, middle and medial cuneiform bones.  Continue empiric antibiotics with vancomycin  and Zosyn  Discussed with vascular surgery, patient had angioplasty and drug-eluting stent placed. Wound culture grew Pseudomonas, Klebsiella as well as Enterobacter. Vascular surgery Rec BKA.  Vancomycin  discontinued.  S/p Zosyn , d/c'd after surgery. On 12/3 s/p right BKA S/p Tylenol  650 mg p.o. 3 times daily scheduled, Dilaudid  p.o. as needed and Dilaudid  IV for breakthrough pain S/p Gabapentin  200 mg p.o. nightly First dressing changed by vascular surgery will be done on Saturday 12/6, can be discharged to SNF if no complications. 12/7 first dressing was changed by vascular surgery, staples intact, wound is healing well. 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 Resolved # Anemia of end-stage renal disease. Continue dialysis per nephrology. Received sodium phosphate  11/27.   12/6 Hb  7.2, transfuse 1 unit PRBC 12/8 Hb 9.5, posttransfusion   # Hyperkalemia: Resolved  12/3 Lokelma  x 1 dose given. And pt got HD as per schedule   # Hypertension: BP soft, Coreg  has been paused. Monitor BP and manage accordingly  # CAD (coronary artery disease): Continue Zocor  and aspirin  # Diarrhea: Resolved  # Dementia: No behavior disturbance. Contnued Donepezil  and Namenda    # Folic acid  deficiency, folate level 4.3, started folic acid   1 mg p.o. daily.  Follow with PCP to repeat folic acid  level in between 3 to 6 months. # Vitamin B12 level 319, goal >400.  Started vitamin B12 oral supplement.  Follow with PCP to repeat B12 level in between 3 to 6 months.   # Vitamin D  level 33, at lower end, started Calcitrol 0.25 mg p.o. daily to prevent deficiency.  F/u with PCP to Repeat vitamin D  level in between 3 to 6 months.    Body mass index  is 26.18 kg/m.  Nutrition Interventions:   Pain control  - [place]  Controlled Substance Reporting System database could not be reviewed, as website was not working - Dilaudid   1 mg po q6h prn for pain, 10 tabs prescribed as per SNF requirement.  - Patient was instructed, not to drive, operate heavy machinery, perform activities at heights, swimming or participation in water activities or provide baby sitting services while on Pain, Sleep and Anxiety Medications; until his outpatient Physician has advised to do so again.  - Also recommended to not to take more than prescribed Pain, Sleep and Anxiety Medications.  Patient was seen by physical therapy, who recommended Therapy, SNF placement, which was arranged. On the day of the discharge the patient's vitals were stable, and no other acute medical condition were reported by patient. the patient was felt safe to be discharge at SNF with Therapy.  Consultants: Vascular Surgery, Nephrology and Podiatry Procedures: s/p Right BKA and HD TTS schedule  Discharge Exam: General: Appear in no distress, Oral Mucosa Clear, moist. Cardiovascular: S1 and S2 Present, no Murmur, Respiratory: normal respiratory effort, Bilateral Air entry present and no Crackles, no wheezes Abdomen: Bowel Sound present, Soft and no tenderness. Extremities: s/p Right BKA, dressing CDI, LLE no edema Neurology: No focal deficits. Baseline dementia  affect appropriate.  Filed Weights   06/17/24 0804 06/17/24 1200 06/19/24 0600  Weight: 91.3 kg 90.3 kg 90 kg   Vitals:   06/19/24 0441 06/19/24 0807  BP: (!) 104/57 116/71  Pulse: 65 66  Resp: 18 17  Temp: 97.9 F (36.6 C) 98.3 F (36.8 C)  SpO2: 99% 99%    DISCHARGE MEDICATION: Allergies as of 06/19/2024       Reactions   Lisinopril Cough        Medication List     PAUSE taking these medications    carvedilol  12.5 MG tablet Wait to take this until your doctor or other care provider tells you to  start again. Due to low blood pressure. Commonly known as: COREG  Take 12.5 mg by mouth at bedtime.   torsemide  20 MG tablet Wait to take this until your doctor or other care provider tells you to start again. Due to low blood pressure. Commonly known as: DEMADEX  Take 80 mg by mouth daily.       STOP taking these medications    diclofenac sodium 1 % Gel Commonly known as: VOLTAREN   Phospha 250 Neutral 155-852-130 MG Tabs       TAKE these medications    acetaminophen  325 MG tablet Commonly known as: TYLENOL  Take 650 mg by mouth every 4 (four) hours as needed for mild pain (pain score 1-3).   alum & mag hydroxide-simeth 200-200-20 MG/5ML suspension Commonly known as: MAALOX/MYLANTA Take 30 mLs by mouth every 6 (six) hours as needed for indigestion or heartburn.   aspirin  81 MG chewable tablet Chew 81 mg by mouth daily.   calcitRIOL  0.25 MCG capsule Commonly known as: ROCALTROL  Take  1 capsule (0.25 mcg total) by mouth daily. Start taking on: June 20, 2024   clopidogrel  75 MG tablet Commonly known as: PLAVIX  Take 1 tablet (75 mg total) by mouth daily. Start taking on: June 20, 2024   Combigan  0.2-0.5 % ophthalmic solution Generic drug: brimonidine -timolol  Place 1 drop into both eyes 2 (two) times daily.   cyanocobalamin  1000 MCG tablet Take 1 tablet (1,000 mcg total) by mouth daily. Start taking on: June 20, 2024   donepezil  10 MG tablet Commonly known as: ARICEPT  Take 10 mg by mouth at bedtime.   entecavir  0.5 MG tablet Commonly known as: BARACLUDE  Take 0.5 mg by mouth every 3 (three) days.   folic acid  1 MG tablet Commonly known as: FOLVITE  Take 1 tablet (1 mg total) by mouth daily. Start taking on: June 20, 2024   guaiFENesin 100 MG/5ML liquid Commonly known as: ROBITUSSIN Take 15 mLs by mouth every 4 (four) hours as needed for cough or to loosen phlegm. What changed: Another medication with the same name was removed. Continue taking  this medication, and follow the directions you see here.   HYDROmorphone  2 MG tablet Commonly known as: DILAUDID  Take 0.5 tablets (1 mg total) by mouth every 6 (six) hours as needed for severe pain (pain score 7-10) or moderate pain (pain score 4-6).   ipratropium-albuterol  0.5-2.5 (3) MG/3ML Soln Commonly known as: DUONEB Inhale 3 mLs into the lungs every 6 (six) hours as needed. What changed:  when to take this reasons to take this   loperamide 2 MG tablet Commonly known as: IMODIUM A-D Take 4 mg by mouth 3 (three) times daily as needed for diarrhea or loose stools.   Lumigan 0.01 % Soln Generic drug: bimatoprost Place 1 drop into both eyes at bedtime.   magnesium  hydroxide 400 MG/5ML suspension Commonly known as: MILK OF MAGNESIA Take 30 mLs by mouth daily as needed for mild constipation.   magnesium  oxide 400 (240 Mg) MG tablet Commonly known as: MAG-OX Take 400 mg by mouth daily.   Melatonin 10 MG Tabs Take 1 tablet by mouth at bedtime.   memantine  5 MG tablet Commonly known as: NAMENDA  Take 5 mg by mouth 2 (two) times daily.   midodrine  10 MG tablet Commonly known as: PROAMATINE  Take 10 mg by mouth Every Tuesday,Thursday,and Saturday with dialysis.   Minerin Creme Crea Apply 1 application topically 2 (two) times daily.   Oyster Shell 500 MG Tabs Take 1,000 mg by mouth daily.   pantoprazole  40 MG tablet Commonly known as: PROTONIX  Take 40 mg by mouth daily.   Rhopressa 0.02 % Soln Generic drug: Netarsudil  Dimesylate Place 1 drop into both eyes at bedtime.   simvastatin  10 MG tablet Commonly known as: ZOCOR  Take 10 mg by mouth at bedtime.   traZODone 50 MG tablet Commonly known as: DESYREL Take 75 mg by mouth at bedtime.               Discharge Care Instructions  (From admission, onward)           Start     Ordered   06/19/24 0000  Discharge wound care:       Comments: As per vascular surgery   06/19/24 1059            Allergies  Allergen Reactions   Lisinopril Cough   Discharge Instructions     Call MD for:  difficulty breathing, headache or visual disturbances   Complete by: As directed  Call MD for:  extreme fatigue   Complete by: As directed    Call MD for:  persistant dizziness or light-headedness   Complete by: As directed    Call MD for:  persistant nausea and vomiting   Complete by: As directed    Call MD for:  redness, tenderness, or signs of infection (pain, swelling, redness, odor or green/yellow discharge around incision site)   Complete by: As directed    Call MD for:  severe uncontrolled pain   Complete by: As directed    Call MD for:  temperature >100.4   Complete by: As directed    Discharge instructions   Complete by: As directed    Follow-up with PCP, patient needs to be seen by an MD in 1 to 2 days.  Repeat CBC in 1 to 2 weeks to check hemoglobin. Follow with nephrology and continue hemodialysis TTS schedule. Follow-up with vascular surgery in 1 to 2 weeks for postop check   Discharge wound care:   Complete by: As directed    As per vascular surgery   Increase activity slowly   Complete by: As directed        The results of significant diagnostics from this hospitalization (including imaging, microbiology, ancillary and laboratory) are listed below for reference.    Significant Diagnostic Studies: PERIPHERAL VASCULAR CATHETERIZATION Result Date: 06/07/2024 See surgical note for result.  MR ANKLE RIGHT WO CONTRAST Result Date: 06/06/2024 CLINICAL DATA:  Right foot wound. Evaluate for osteomyelitis. EXAM: MRI OF THE RIGHT ANKLE WITHOUT CONTRAST TECHNIQUE: Multiplanar, multisequence MR imaging of the ankle was performed. No intravenous contrast was administered. COMPARISON:  Radiographs 06/05/2024. FINDINGS: Forefoot findings dictated separately. TENDONS Peroneal: Intact and normally positioned. Posteromedial: Intact and normally positioned. Anterior: The tibialis  anterior tendon is moderately thickened within the distal lower leg. At the level of the soft tissue ulceration, the tendon is attenuated, suspicious for a high-grade partial tear. The extensor digitorum longus tendons appear unremarkable. The extensor hallucis longus tendon is not well visualized at the level of the ulceration. Achilles: Intact. Plantar Fascia: Intact.  Small plantar calcaneal spur. LIGAMENTS Lateral: The anterior and posterior talofibular and calcaneofibular ligaments are intact.The inferior tibiofibular ligaments appear intact. Medial: The deltoid and visualized portions of the spring ligament appear intact. CARTILAGE AND BONES Ankle Joint: Small nonspecific ankle joint effusion. The talar dome and tibial plafond appear normal, without erosive changes. Subtalar Joints/Sinus Tarsi: Unremarkable. Bones: As seen on the forefoot examination, there is abnormal marrow signal within the navicular, medial and middle cuneiform bones with decreased T1 and increased T2 marrow signal. These marrow changes are in close proximity to soft tissue ulceration involving the dorsal and medial aspect of the midfoot, findings highly suspicious for osteomyelitis. No significant osseous abnormalities identified in the hindfoot. Other: As above, soft tissue ulceration along the dorsal and medial aspect of the midfoot with associated partial tearing of the tibialis anterior and possible the extensor hallucis longus tendons. Moderate generalized subcutaneous edema surrounding the ankle and extending into the dorsal aspect of the foot. No organized fluid collection or foreign body identified. IMPRESSION: 1. Soft tissue ulceration along the dorsal and medial aspect of the midfoot with associated partial tearing of the tibialis anterior and possible extensor hallucis longus tendons. 2. Abnormal marrow signal within the navicular, medial and middle cuneiform bones, highly suspicious for osteomyelitis. 3. No evidence of septic  arthritis or osteomyelitis in the hindfoot. 4. Nonspecific small ankle joint effusion. 5. Forefoot findings dictated separately.  Electronically Signed   By: Elsie Perone M.D.   On: 06/06/2024 12:48   MR FOOT RIGHT WO CONTRAST Result Date: 06/06/2024 CLINICAL DATA:  Right foot wound.  Evaluate for osteomyelitis. EXAM: MRI OF THE RIGHT FOREFOOT WITHOUT CONTRAST TECHNIQUE: Multiplanar, multisequence MR imaging of the right forefoot was performed. No intravenous contrast was administered. COMPARISON:  Radiographs 06/05/2024 FINDINGS: Ankle and hindfoot findings are dictated separately. Bones/Joint/Cartilage Soft tissue ulceration along the dorsal medial aspect of the midfoot. Soft tissue findings are further discussed below. There is abnormal marrow signal within the navicular, middle and medial cuneiform bones with decreased T1 and increased T2 signal. Given the proximity to the overlying skin wounds, this is suspicious for osteomyelitis. There is mild T2 hyperintensity medially in the base of the 1st metatarsal. The other metatarsals and phalanges appear unremarkable. No significant joint effusions. The alignment is normal at the Lisfranc joint. Ligaments Intact Lisfranc ligament. The collateral ligaments of the metatarsophalangeal joints are intact. Muscles and Tendons Abnormality of the tibialis anterior tendon is described on ankle/hindfoot examination. The forefoot tendons appear intact, without significant tenosynovitis. Nonspecific mild T2 hyperintensity throughout the forefoot musculature. Soft tissues As above, soft tissue ulceration along the dorsal medial aspect of the midfoot. There is moderate edema throughout the dorsal aspect of the forefoot. No organized fluid collection or foreign body identified. IMPRESSION: 1. Soft tissue ulceration along the dorsal medial aspect of the midfoot with underlying marrow signal abnormality in the navicular, middle and medial cuneiform bones suspicious for  osteomyelitis. 2. Mild T2 hyperintensity medially in the base of the 1st metatarsal, nonspecific. 3. No evidence of soft tissue abscess. 4. Ankle and hindfoot findings are dictated separately. Electronically Signed   By: Elsie Perone M.D.   On: 06/06/2024 12:43   US  ARTERIAL ABI (SCREENING LOWER EXTREMITY) Result Date: 06/06/2024 CLINICAL DATA:  Foot ulcer. Hypertension. Prior vascular surgery. Hyperlipidemia. BILATERAL rest pain and claudication. EXAM: NONINVASIVE PHYSIOLOGIC VASCULAR STUDY OF BILATERAL LOWER EXTREMITIES TECHNIQUE: Evaluation of both lower extremities were performed at rest, including calculation of ankle-brachial indices with single level pressure measurements and doppler recording. COMPARISON:  None available. FINDINGS: Right ABI:  0.86 Left ABI:  0.88 Right Lower Extremity: Posterior tibial and dorsalis pedis artery waveforms are monophasic. Left Lower Extremity: Posterior tibial and dorsalis pedis artery waveforms are monophasic. 0.8-0.89 Mild PAD IMPRESSION: Decreased ankle-brachial indices indicative of mild BILATERAL lower extremity peripheral arterial disease. Electronically Signed   By: Aliene Lloyd M.D.   On: 06/06/2024 10:06   DG Foot Complete Left Result Date: 06/05/2024 EXAM: 3 OR MORE VIEW(S) XRAY OF THE LEFT FOOT 06/05/2024 05:45:00 PM COMPARISON: None available. CLINICAL HISTORY: wound FINDINGS: BONES AND JOINTS: Diffuse bony demineralization. Indistinct deformity of the proximal phalanx fourth toe could be from acute or healed fracture. Plantar calcaneal spur. No joint dislocation. SOFT TISSUES: Dorsal midfoot skin defect slash bandaging. Soft tissue edema especially along the dorsal forefoot. IMPRESSION: 1. Indistinct deformity of the proximal phalanx of the fourth toe, possibly from acute or healed fracture. 2. Soft tissue edema along the dorsal forefoot with a dorsal midfoot skin defect and overlying bandaging. 3. Diffuse bony demineralization. 4. Plantar calcaneal  spur. Electronically signed by: Ryan Salvage MD 06/05/2024 06:50 PM EST RP Workstation: [id number]   DG Foot Complete Right Result Date: 06/05/2024 EXAM: 3 or more VIEW(S) XRAY OF THE RIGHT FOOT 06/05/2024 05:45:00 PM COMPARISON: None available. CLINICAL HISTORY: wound FINDINGS: BONES AND JOINTS: Diffuse osteopenia. The extent of osteopenia makes evaluation for focal osseous  destruction challenging. Given this limitation, no focal osseous abnormality identified. No acute fracture. No joint dislocation. SOFT TISSUES: Mild to moderate diffuse soft tissue swelling. More focal soft tissue irregularity about the lateral midfoot and dorsum of the midfoot. IMPRESSION: 1. Soft tissue swelling with lateral and dorsal soft tissue irregularity, suspicious for ulcers. 2. Diffuse osteopenia, limiting sensitivity for osseous destruction by plain film. Consider pre and postcontrast MRI. Electronically signed by: Rockey Kilts MD 06/05/2024 06:46 PM EST RP Workstation: [id number]   DG Chest 2 View Result Date: 06/05/2024 EXAM: 2 VIEW(S) XRAY OF THE CHEST 06/05/2024 05:45:00 PM COMPARISON: 07/12/2023 CLINICAL HISTORY: Wound infection FINDINGS: LINES, TUBES AND DEVICES: Left upper extremity vascular stent noted. LUNGS AND PLEURA: No focal pulmonary opacity. No pleural effusion. No pneumothorax. HEART AND MEDIASTINUM: Aortic atherosclerosis. No acute abnormality of the cardiac and mediastinal silhouettes. BONES AND SOFT TISSUES: Old bilateral rib fractures. IMPRESSION: 1. No acute cardiopulmonary findings. Electronically signed by: Rockey Kilts MD 06/05/2024 06:42 PM EST RP Workstation: [id number]    Microbiology: No results found for this or any previous visit (from the past 240 hours).   Labs: CBC: Recent Labs  Lab 06/15/24 0755 06/16/24 0542 06/17/24 0830 06/18/24 0329 06/19/24 0329  WBC 10.8* 8.2 9.3 10.5 8.5  HGB 8.9* 8.0* 7.2* 9.6* 9.5*  HCT 27.4* 24.9* 22.5* 29.2* 29.8*  MCV 85.4 85.9 87.5 86.9 88.4   PLT 275 301 319 346 341   Basic Metabolic Panel: Recent Labs  Lab 06/13/24 0740 06/14/24 1138 06/15/24 0755 06/16/24 0542 06/17/24 0311 06/18/24 0329 06/19/24 0329  NA 133*   < > 130* 132* 132* 133* 134*  K 5.0   < > 5.3* 4.8 5.3* 4.1 4.5  CL 93*   < > 91* 93* 94* 95* 94*  CO2 25  --  23 27 22 28 27   GLUCOSE 81   < > 95 97 73 98 90  BUN 51*   < > 43* 27* 31* 26* 45*  CREATININE 8.37*   < > 7.46* 5.47* 7.39* 5.46* 7.81*  CALCIUM  8.5*  --  8.7* 8.4* 8.4* 8.5* 8.6*  MG  --   --  2.2 2.0 2.2  --   --   PHOS 3.4  --  3.1 2.9 3.7  --   --    < > = values in this interval not displayed.   Liver Function Tests: Recent Labs  Lab 06/13/24 0740  ALBUMIN 3.0*   No results for input(s): LIPASE, AMYLASE in the last 168 hours. No results for input(s): AMMONIA in the last 168 hours. Cardiac Enzymes: No results for input(s): CKTOTAL, CKMB, CKMBINDEX, TROPONINI in the last 168 hours. BNP (last 3 results) No results for input(s): BNP in the last 8760 hours. CBG: Recent Labs  Lab 06/15/24 0726 06/16/24 0809 06/16/24 2108 06/17/24 0727 06/18/24 0734  GLUCAP 94 94 91 128* 97    Time spent: 35 minutes  Signed:  Elvan Sor  [facility] 06/19/2024 10:59 AM

## 2024-06-19 NOTE — Progress Notes (Signed)
 [facility]  ROUNDING NOTE   Subjective:   Anthony Houston is a 73 year old male with past medical conditions including hypertension, GERD, dementia, CAD, and end-stage renal disease on hemodialysis.  Patient presents to the emergency department for evaluation of foot wound and pain and has been admitted for Foot ulcer (HCC) [L97.509] Right foot infection [L08.9]  Patient is known to our practice and receives outpatient dialysis treatments at [facility] on a TTS schedule, supervised by Dr. Dennise.    Update:  Patient sitting up in bed Alert, awaiting breakfast Room air Denies pain  Objective:  Vital signs in last 24 hours:  Temp:  [97.5 F (36.4 C)-98.9 F (37.2 C)] 98.3 F (36.8 C) (12/08 0807) Pulse Rate:  [65-76] 66 (12/08 0807) Resp:  [17-18] 17 (12/08 0807) BP: (104-126)/(57-71) 116/71 (12/08 0807) SpO2:  [99 %] 99 % (12/08 0807) Weight:  [90 kg] 90 kg (12/08 0600)  Weight change: -1.3 kg Filed Weights   06/17/24 0804 06/17/24 1200 06/19/24 0600  Weight: 91.3 kg 90.3 kg 90 kg    Intake/Output: I/O last 3 completed shifts: In: 390 [P.O.:390] Out: -    Intake/Output this shift:  Total I/O In: 120 [P.O.:120] Out: -   Physical Exam: General: NAD  Head: Normocephalic, atraumatic. Moist oral mucosal membranes  Eyes: Anicteric  Lungs:  Clear to auscultation, normal effort  Heart: Regular rate and rhythm  Abdomen:  Soft, nontender  Extremities:  No peripheral edema.  Neurologic: Awake, alert  Skin: Warm,dry, Right lBKA  Access: Lt AVF    Basic Metabolic Panel: Recent Labs  Lab 06/13/24 0740 06/14/24 1138 06/15/24 0755 06/16/24 0542 06/17/24 0311 06/18/24 0329 06/19/24 0329  NA 133*   < > 130* 132* 132* 133* 134*  K 5.0   < > 5.3* 4.8 5.3* 4.1 4.5  CL 93*   < > 91* 93* 94* 95* 94*  CO2 25  --  23 27 22 28 27   GLUCOSE 81   < > 95 97 73 98 90  BUN 51*   < > 43* 27* 31* 26* 45*  CREATININE 8.37*   < > 7.46* 5.47* 7.39*  5.46* 7.81*  CALCIUM  8.5*  --  8.7* 8.4* 8.4* 8.5* 8.6*  MG  --   --  2.2 2.0 2.2  --   --   PHOS 3.4  --  3.1 2.9 3.7  --   --    < > = values in this interval not displayed.    Liver Function Tests: Recent Labs  Lab 06/13/24 0740  ALBUMIN 3.0*   No results for input(s): LIPASE, AMYLASE in the last 168 hours. No results for input(s): AMMONIA in the last 168 hours.  CBC: Recent Labs  Lab 06/15/24 0755 06/16/24 0542 06/17/24 0830 06/18/24 0329 06/19/24 0329  WBC 10.8* 8.2 9.3 10.5 8.5  HGB 8.9* 8.0* 7.2* 9.6* 9.5*  HCT 27.4* 24.9* 22.5* 29.2* 29.8*  MCV 85.4 85.9 87.5 86.9 88.4  PLT 275 301 319 346 341    Cardiac Enzymes: No results for input(s): CKTOTAL, CKMB, CKMBINDEX, TROPONINI in the last 168 hours.  BNP: Invalid input(s): POCBNP  CBG: Recent Labs  Lab 06/15/24 0726 06/16/24 0809 06/16/24 2108 06/17/24 0727 06/18/24 0734  GLUCAP 94 94 91 128* 97    Microbiology: Results for orders placed or performed during the hospital encounter of 06/05/24  Culture, blood (single) w Reflex to ID Panel     Status: None   Collection Time: 06/05/24  4:58 PM   Specimen: BLOOD  Result Value Ref Range Status   Specimen Description BLOOD LEFT ANTECUBITAL  Final   Special Requests   Final    BOTTLES DRAWN AEROBIC AND ANAEROBIC Blood Culture adequate volume   Culture   Final    NO GROWTH 5 DAYS Performed at [facility], [address] [place], [place] [address]    Report Status 06/11/2024 FINAL  Final  Culture, blood (Routine X 2) w Reflex to ID Panel     Status: None   Collection Time: 06/05/24  9:39 PM   Specimen: BLOOD  Result Value Ref Range Status   Specimen Description BLOOD BLOOD RIGHT ARM  Final   Special Requests   Final    BOTTLES DRAWN AEROBIC AND ANAEROBIC Blood Culture adequate volume   Culture   Final    NO GROWTH 5 DAYS Performed at [facility], [address], [place], [place] [address]    Report Status  06/11/2024 FINAL  Final  Aerobic Culture w Gram Stain (superficial specimen)     Status: None   Collection Time: 06/05/24  9:51 PM   Specimen: Wound  Result Value Ref Range Status   Specimen Description   Final    WOUND Performed at [facility], [address], [place], [place] [address]    Special Requests   Final    NONE Performed at [facility], [address] [place], [place] [address]    Gram Stain   Final    RARE WBC PRESENT, PREDOMINANTLY PMN RARE GRAM NEGATIVE RODS RARE GRAM POSITIVE COCCI Performed at [facility], [address] [place], [place] [address]    Culture   Final    ABUNDANT PSEUDOMONAS AERUGINOSA ABUNDANT ENTEROBACTER CLOACAE MODERATE KLEBSIELLA PNEUMONIAE    Report Status 06/10/2024 FINAL  Final   Organism ID, Bacteria ENTEROBACTER CLOACAE  Final   Organism ID, Bacteria KLEBSIELLA PNEUMONIAE  Final   Organism ID, Bacteria PSEUDOMONAS AERUGINOSA  Final      Susceptibility   Enterobacter cloacae - MIC*    CEFEPIME  <=0.12 SENSITIVE Sensitive     ERTAPENEM <=0.12 SENSITIVE Sensitive     CIPROFLOXACIN <=0.06 SENSITIVE Sensitive     GENTAMICIN <=1 SENSITIVE Sensitive     MEROPENEM <=0.25 SENSITIVE Sensitive     TRIMETH/SULFA <=20 SENSITIVE Sensitive     PIP/TAZO Value in next row Sensitive      <=4 SENSITIVEThis is a modified FDA-approved test that has been validated and its performance characteristics determined by the reporting laboratory.  This laboratory is certified under the Clinical Laboratory Improvement Amendments CLIA as qualified to perform high complexity clinical laboratory testing.    * ABUNDANT ENTEROBACTER CLOACAE   Klebsiella pneumoniae - MIC*    AMPICILLIN Value in next row Resistant      <=4 SENSITIVEThis is a modified FDA-approved test that has been validated and its performance characteristics determined by the reporting laboratory.  This laboratory is certified under the Clinical Laboratory Improvement  Amendments CLIA as qualified to perform high complexity clinical laboratory testing.    CEFAZOLIN  (NON-URINE) Value in next row Sensitive      <=4 SENSITIVEThis is a modified FDA-approved test that has been validated and its performance characteristics determined by the reporting laboratory.  This laboratory is certified under the Clinical Laboratory Improvement Amendments CLIA as qualified to perform high complexity clinical laboratory testing.    CEFEPIME  Value in next row Sensitive      <=4 SENSITIVEThis  is a modified FDA-approved test that has been validated and its performance characteristics determined by the reporting laboratory.  This laboratory is certified under the Clinical Laboratory Improvement Amendments CLIA as qualified to perform high complexity clinical laboratory testing.    ERTAPENEM Value in next row Sensitive      <=4 SENSITIVEThis is a modified FDA-approved test that has been validated and its performance characteristics determined by the reporting laboratory.  This laboratory is certified under the Clinical Laboratory Improvement Amendments CLIA as qualified to perform high complexity clinical laboratory testing.    CEFTRIAXONE  Value in next row Sensitive      <=4 SENSITIVEThis is a modified FDA-approved test that has been validated and its performance characteristics determined by the reporting laboratory.  This laboratory is certified under the Clinical Laboratory Improvement Amendments CLIA as qualified to perform high complexity clinical laboratory testing.    CIPROFLOXACIN Value in next row Sensitive      <=4 SENSITIVEThis is a modified FDA-approved test that has been validated and its performance characteristics determined by the reporting laboratory.  This laboratory is certified under the Clinical Laboratory Improvement Amendments CLIA as qualified to perform high complexity clinical laboratory testing.    GENTAMICIN Value in next row Sensitive      <=4 SENSITIVEThis is a  modified FDA-approved test that has been validated and its performance characteristics determined by the reporting laboratory.  This laboratory is certified under the Clinical Laboratory Improvement Amendments CLIA as qualified to perform high complexity clinical laboratory testing.    MEROPENEM Value in next row Sensitive      <=4 SENSITIVEThis is a modified FDA-approved test that has been validated and its performance characteristics determined by the reporting laboratory.  This laboratory is certified under the Clinical Laboratory Improvement Amendments CLIA as qualified to perform high complexity clinical laboratory testing.    TRIMETH/SULFA Value in next row Sensitive      <=4 SENSITIVEThis is a modified FDA-approved test that has been validated and its performance characteristics determined by the reporting laboratory.  This laboratory is certified under the Clinical Laboratory Improvement Amendments CLIA as qualified to perform high complexity clinical laboratory testing.    AMPICILLIN/SULBACTAM Value in next row Sensitive      <=4 SENSITIVEThis is a modified FDA-approved test that has been validated and its performance characteristics determined by the reporting laboratory.  This laboratory is certified under the Clinical Laboratory Improvement Amendments CLIA as qualified to perform high complexity clinical laboratory testing.    PIP/TAZO Value in next row Sensitive      <=4 SENSITIVEThis is a modified FDA-approved test that has been validated and its performance characteristics determined by the reporting laboratory.  This laboratory is certified under the Clinical Laboratory Improvement Amendments CLIA as qualified to perform high complexity clinical laboratory testing.    * MODERATE KLEBSIELLA PNEUMONIAE   Pseudomonas aeruginosa - MIC*    MEROPENEM Value in next row Sensitive      <=4 SENSITIVEThis is a modified FDA-approved test that has been validated and its performance characteristics  determined by the reporting laboratory.  This laboratory is certified under the Clinical Laboratory Improvement Amendments CLIA as qualified to perform high complexity clinical laboratory testing.    CIPROFLOXACIN Value in next row Sensitive      <=4 SENSITIVEThis is a modified FDA-approved test that has been validated and its performance characteristics determined by the reporting laboratory.  This laboratory is certified under the Clinical Laboratory Improvement Amendments CLIA as qualified to perform high  complexity clinical laboratory testing.    IMIPENEM Value in next row Sensitive      <=4 SENSITIVEThis is a modified FDA-approved test that has been validated and its performance characteristics determined by the reporting laboratory.  This laboratory is certified under the Clinical Laboratory Improvement Amendments CLIA as qualified to perform high complexity clinical laboratory testing.    PIP/TAZO Value in next row Sensitive      16 SENSITIVEThis is a modified FDA-approved test that has been validated and its performance characteristics determined by the reporting laboratory.  This laboratory is certified under the Clinical Laboratory Improvement Amendments CLIA as qualified to perform high complexity clinical laboratory testing.    CEFEPIME  Value in next row Sensitive      16 SENSITIVEThis is a modified FDA-approved test that has been validated and its performance characteristics determined by the reporting laboratory.  This laboratory is certified under the Clinical Laboratory Improvement Amendments CLIA as qualified to perform high complexity clinical laboratory testing.    CEFTAZIDIME/AVIBACTAM Value in next row Sensitive      16 SENSITIVEThis is a modified FDA-approved test that has been validated and its performance characteristics determined by the reporting laboratory.  This laboratory is certified under the Clinical Laboratory Improvement Amendments CLIA as qualified to perform high  complexity clinical laboratory testing.    CEFTOLOZANE/TAZOBACTAM Value in next row Sensitive      16 SENSITIVEThis is a modified FDA-approved test that has been validated and its performance characteristics determined by the reporting laboratory.  This laboratory is certified under the Clinical Laboratory Improvement Amendments CLIA as qualified to perform high complexity clinical laboratory testing.    TOBRAMYCIN Value in next row Sensitive      16 SENSITIVEThis is a modified FDA-approved test that has been validated and its performance characteristics determined by the reporting laboratory.  This laboratory is certified under the Clinical Laboratory Improvement Amendments CLIA as qualified to perform high complexity clinical laboratory testing.    CEFTAZIDIME Value in next row Sensitive      16 SENSITIVEThis is a modified FDA-approved test that has been validated and its performance characteristics determined by the reporting laboratory.  This laboratory is certified under the Clinical Laboratory Improvement Amendments CLIA as qualified to perform high complexity clinical laboratory testing.    * ABUNDANT PSEUDOMONAS AERUGINOSA  Culture, blood (Routine X 2) w Reflex to ID Panel     Status: None   Collection Time: 06/05/24 11:25 PM   Specimen: BLOOD  Result Value Ref Range Status   Specimen Description BLOOD RIGHT ANTECUBITAL  Final   Special Requests   Final    BOTTLES DRAWN AEROBIC AND ANAEROBIC Blood Culture adequate volume   Culture   Final    NO GROWTH 5 DAYS Performed at [facility], [address], [place], [place] [address]    Report Status 06/10/2024 FINAL  Final  MRSA Next Gen by PCR, Nasal     Status: None   Collection Time: 06/09/24  8:11 AM   Specimen: Nasal Mucosa; Nasal Swab  Result Value Ref Range Status   MRSA by PCR Next Gen NOT DETECTED NOT DETECTED Final    Comment: (NOTE) The GeneXpert MRSA Assay (FDA approved for NASAL specimens only), is one component of  a comprehensive MRSA colonization surveillance program. It is not intended to diagnose MRSA infection nor to guide or monitor treatment for MRSA infections. Test performance is not FDA approved in patients less than 54 years old. Performed at [facility],  [address], [place], [place] [address]     Coagulation Studies: No results for input(s): LABPROT, INR in the last 72 hours.   Urinalysis: No results for input(s): COLORURINE, LABSPEC, PHURINE, GLUCOSEU, HGBUR, BILIRUBINUR, KETONESUR, PROTEINUR, UROBILINOGEN, NITRITE, LEUKOCYTESUR in the last 72 hours.  Invalid input(s): APPERANCEUR    Imaging: No results found.    Medications:      sodium chloride    Intravenous Once   acetaminophen   650 mg Oral TID   aspirin   81 mg Oral Daily   brimonidine   1 drop Both Eyes BID   And   timolol   1 drop Both Eyes BID   calcitRIOL   0.25 mcg Oral Daily   Chlorhexidine  Gluconate Cloth  6 each Topical Q0600   clopidogrel   75 mg Oral Daily   collagenase    Topical Daily   vitamin B-12  1,000 mcg Oral Daily   donepezil   10 mg Oral QHS   entecavir   0.5 mg Oral Q72H   epoetin  alfa-epbx (RETACRIT ) injection  4,000 Units Intravenous Q T,Th,Sat-1800   folic acid   1 mg Oral Daily   gabapentin   200 mg Oral QHS   heparin   5,000 Units Subcutaneous Q8H   latanoprost   1 drop Both Eyes QHS   memantine   5 mg Oral BID   senna-docusate  2 tablet Oral BID   simvastatin   10 mg Oral QHS   hydrALAZINE , HYDROmorphone  (DILAUDID ) injection, HYDROmorphone , midodrine , ondansetron  (ZOFRAN ) IV, polyethylene glycol  Assessment/ Plan:  [name] is a 73 y.o.  male with past medical conditions including hypertension, GERD, dementia, CAD, and end-stage renal disease on hemodialysis.  Patient presents to the emergency department for evaluation of foot wound and pain and has been admitted for Foot ulcer (HCC) [L97.509] Right foot infection [L08.9]   End stage  renal disease on hemodialysis.  Next treatment scheduled for Tuesday. Will discharge to rehab today.    2. Anemia of chronic kidney disease Lab Results  Component Value Date   HGB 9.5 (L) 06/19/2024  Hgb 9.5. Patient has received blood transfusion during this admission. Continue retacrit  with dialysis.   3. Secondary Hyperparathyroidism: with outpatient labs:none available  Lab Results  Component Value Date   PTH 101 (H) 07/26/2018   CALCIUM  8.6 (L) 06/19/2024   CAION 1.10 (L) 06/14/2024   PHOS 3.7 06/17/2024   Calcium  and phos stable  4. Hypotension with chronic kidney disease. Prescribed Midodrine  outpatient. Blood pressure stable.   5. 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 Has received Vancomycin  and Zosyn  during this admission. PT/OT and pain management per primary team. Discharging to rehab   LOS: 13 Anthony Houston 12/8/20254:04 PM

## 2024-06-19 NOTE — Progress Notes (Signed)
 The clinical research associate called [facility] and gave report to Navarre, LPN. The pt shows no s/s of pain or discomfort at this time. Will continue to monitor until pt is discharged.

## 2024-06-19 NOTE — Plan of Care (Signed)
  Problem: Health Behavior/Discharge Planning: Goal: Ability to manage health-related needs will improve Outcome: Progressing   Problem: Clinical Measurements: Goal: Cardiovascular complication will be avoided Outcome: Progressing   Problem: Nutrition: Goal: Adequate nutrition will be maintained Outcome: Completed/Met

## 2024-06-19 NOTE — TOC Transition Note (Signed)
 Transition of Care [facility]) - Discharge Note   Patient Details  Name: Anthony Houston MRN: [id number] Date of Birth: [birth year]  [facility] CM/SW Contact:  Daved JONETTA Hamilton, RN Phone Number: 06/19/2024, 12:12 PM   Clinical Narrative:     Patient will DC to: [facility] Anticipated DC date: 06/19/2024 Family notified: Dannielle Ellen Transport by: Zona  Per MD patient ready for DC to [facility]. RN, patient's family, and facility notified of DC. Discharge Summary sent to facility. RN given number for report. DC packet on chart. Ambulance transport requested for patient.   TOC signing off.   Final next level of care: Skilled Nursing Facility Barriers to Discharge: Barriers Resolved   Patient Goals and CMS Choice     Choice offered to / list presented to : [facility] POA / Guardian      Discharge Placement              Patient chooses bed at: [facility] Patient to be transferred to facility by: [facility] Name of family member notified: Dannielle Ellen Patient and family notified of of transfer: 06/19/24  Discharge Plan and Services Additional resources added to the After Visit Summary for                                       Social Drivers of Health (SDOH) Interventions SDOH Screenings   Food Insecurity: Patient Unable To Answer (06/06/2024)  Housing: Unknown (06/06/2024)  Transportation Needs: Patient Unable To Answer (06/06/2024)  Utilities: Patient Unable To Answer (06/06/2024)  Social Connections: Patient Unable To Answer (06/06/2024)  Tobacco Use: Low Risk  (06/14/2024)     Readmission Risk Interventions     No data to display

## 2024-06-20 NOTE — Progress Notes (Signed)
 D/C order noted. Contacted [facility] @10 :45.  Requested documents faxed to clinic for continuation of care.  Suzen Satchel Dialysis Navigator [phone]

## 2024-06-21 ENCOUNTER — Telehealth (INDEPENDENT_AMBULATORY_CARE_PROVIDER_SITE_OTHER): Payer: Self-pay

## 2024-06-21 NOTE — Telephone Encounter (Signed)
 Jayla from [facility] called stating that the patient had a right BKA on 06/14/24 with Dr. Marea and was wondering about wound care. The discharge summary does not clarify what to do with the wound or any dressing after 06/18/24. Jayla wanted to know should the dressing stay as is until patient is seen in office.

## 2024-06-21 NOTE — Telephone Encounter (Signed)
 Spoke with Jayla at [facility] and she was given the recommendation from Orvin Daring NP. See notes below.

## 2024-06-21 NOTE — Telephone Encounter (Signed)
 They should clean the wound with betadine and cover with dry dressing.   If they have xeroform available they can use that over the incision.  He should be seen three weeks after his surgery for staple removal

## 2024-06-30 ENCOUNTER — Telehealth (INDEPENDENT_AMBULATORY_CARE_PROVIDER_SITE_OTHER): Payer: Self-pay | Admitting: Vascular Surgery

## 2024-06-30 NOTE — Telephone Encounter (Signed)
 Received call from Sonny Cork at [phone] MZ:Dryzilopwh pt appt. Tried to call facility # on file for pt-they stated pt no longer there. LVM for Sonny to [facility] to schedule appointment for pt.  Fu 3 weeks staple removal see APP/JD (from date of procedure)

## 2024-07-03 ENCOUNTER — Encounter (INDEPENDENT_AMBULATORY_CARE_PROVIDER_SITE_OTHER): Payer: Self-pay | Admitting: Vascular Surgery

## 2024-07-03 ENCOUNTER — Ambulatory Visit (INDEPENDENT_AMBULATORY_CARE_PROVIDER_SITE_OTHER): Admitting: Vascular Surgery

## 2024-07-03 VITALS — BP 142/67 | HR 72

## 2024-07-03 DIAGNOSIS — Z89511 Acquired absence of right leg below knee: Secondary | ICD-10-CM

## 2024-07-24 ENCOUNTER — Ambulatory Visit (INDEPENDENT_AMBULATORY_CARE_PROVIDER_SITE_OTHER): Admitting: Vascular Surgery

## 2024-07-28 ENCOUNTER — Ambulatory Visit (INDEPENDENT_AMBULATORY_CARE_PROVIDER_SITE_OTHER): Admitting: Nurse Practitioner

## 2024-08-02 ENCOUNTER — Other Ambulatory Visit (INDEPENDENT_AMBULATORY_CARE_PROVIDER_SITE_OTHER): Payer: Self-pay | Admitting: Nurse Practitioner

## 2024-08-02 ENCOUNTER — Encounter (INDEPENDENT_AMBULATORY_CARE_PROVIDER_SITE_OTHER): Payer: Self-pay | Admitting: Nurse Practitioner

## 2024-08-02 ENCOUNTER — Ambulatory Visit (INDEPENDENT_AMBULATORY_CARE_PROVIDER_SITE_OTHER): Admitting: Nurse Practitioner

## 2024-08-02 VITALS — BP 127/73 | HR 69 | Resp 16

## 2024-08-02 DIAGNOSIS — T8789 Other complications of amputation stump: Secondary | ICD-10-CM

## 2024-08-02 DIAGNOSIS — T8189XA Other complications of procedures, not elsewhere classified, initial encounter: Secondary | ICD-10-CM

## 2024-08-06 ENCOUNTER — Encounter (INDEPENDENT_AMBULATORY_CARE_PROVIDER_SITE_OTHER): Payer: Self-pay | Admitting: Nurse Practitioner

## 2024-08-06 NOTE — Progress Notes (Signed)
 "  Subjective:    Patient ID: Anthony Houston, male    DOB: 1951/03/13, 74 y.o.   MRN: [id number] Chief Complaint  Patient presents with   Follow-up    Wound check    HPI  Discussed the use of AI scribe software for clinical note transcription with the patient, who gave verbal consent to proceed.  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 He denies pain at the site.  He reports mild difficulty fully extending the knee, though this is not severe. He resides at home and does not report any additional symptoms.    Results Wound probing Probe inserted into wound on leg to assess depth. Tract extends approximately 1 cm   Review of Systems  Musculoskeletal:  Positive for gait problem.  Skin:  Positive for wound.  Neurological:  Positive for weakness.  All other systems reviewed and are negative.      Objective:   Physical Exam Vitals reviewed.  HENT:     Head: Normocephalic.  Cardiovascular:     Rate and Rhythm: Normal rate.  Pulmonary:     Effort: Pulmonary effort is normal.  Musculoskeletal:     Right Lower Extremity: Right leg is amputated below knee.  Skin:    General: Skin is warm and dry.  Neurological:     Mental Status: He is alert and oriented to person, place, and time.  Psychiatric:        Mood and Affect: Mood normal.        Behavior: Behavior normal.        Thought Content: Thought content normal.        Judgment: Judgment normal.     Physical Exam SKIN: Small hole in skin on leg, approximately 1 cm deep.  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   Glaucoma    Hypertension    Knee pain, left     Multiple myeloma (HCC)     Social History   Socioeconomic History   Marital status: Married    Spouse name: Ronal Caldron    Number of children: 2   Years of education: Not on file   Highest education level: Not on file  Occupational History   Occupation: retired    Comment: games developer  Tobacco Use   Smoking status: Never   Smokeless tobacco: Never  Substance and Sexual Activity   Alcohol use: Not Currently   Drug use: Never   Sexual activity: Not Currently  Other Topics Concern   Not on file  Social History Narrative   Not on file   Social Drivers of Health   Tobacco Use: Low Risk (08/02/2024)   Patient History    Smoking Tobacco Use: Never    Smokeless Tobacco Use: Never    Passive Exposure: Not on file  Financial Resource Strain: Not on file  Food Insecurity: Patient Unable To Answer (06/06/2024)   Epic    Worried About Programme Researcher, Broadcasting/film/video in the Last Year: Patient unable to answer    [employer]  in the Last Year: Patient unable to answer  Transportation Needs: Patient Unable To Answer (06/06/2024)   Epic    Lack of Transportation (Medical): Patient unable to answer    Lack of Transportation (Non-Medical): Patient unable to answer  Physical Activity: Not on file  Stress: Not on file  Social Connections: Patient Unable To Answer (06/06/2024)   Social Connection and Isolation Panel    Frequency of Communication with Friends and Family: Patient unable to answer    Frequency of Social Gatherings with Friends and Family: Patient unable to answer    Attends Religious Services: Patient unable to answer    Active Member of Clubs or Organizations: Patient unable to answer    Attends Banker Meetings: Patient unable to answer    Marital Status: Patient unable to answer  Intimate Partner Violence: Patient Unable To Answer (06/06/2024)   Epic    Fear of Current or Ex-Partner: Patient unable to answer    Emotionally Abused: Patient unable to answer     Physically Abused: Patient unable to answer    Sexually Abused: Patient unable to answer  Depression (PHQ2-9): Not on file  Alcohol Screen: Not on file  Housing: Unknown (06/06/2024)   Epic    Unable to Pay for Housing in the Last Year: Patient unable to answer    Number of Times Moved in the Last Year: 0    Homeless in the Last Year: Patient unable to answer  Utilities: Patient Unable To Answer (06/06/2024)   Epic    Threatened with loss of utilities: Patient unable to answer  Health Literacy: Not on file    Past Surgical History:  Procedure Laterality Date   ABDOMINAL SURGERY  w/in 10 years   to remove cancer    AMPUTATION Right 06/14/2024   Procedure: AMPUTATION BELOW KNEE;  Surgeon: Marea Selinda RAMAN, MD;  Location: [facility];  Service: Vascular;  Laterality: Right;   DIALYSIS/PERMA CATHETER INSERTION N/A 07/26/2018   Procedure: DIALYSIS/PERMA CATHETER INSERTION;  Surgeon: Jama Cordella MATSU, MD;  Location: [facility];  Service: Cardiovascular;  Laterality: N/A;   DIALYSIS/PERMA CATHETER INSERTION N/A 05/05/2019   Procedure: DIALYSIS/PERMA CATHETER EXCHANGE;  Surgeon: Jama Cordella MATSU, MD;  Location: [facility];  Service: Cardiovascular;  Laterality: N/A;   LOWER EXTREMITY ANGIOGRAPHY Right 06/07/2024   Procedure: Lower Extremity Angiography;  Surgeon: Marea Selinda RAMAN, MD;  Location: [facility];  Service: Cardiovascular;  Laterality: Right;   NO PAST SURGERIES     PERIPHERAL VASCULAR THROMBECTOMY Left 05/05/2019   Procedure: PERIPHERAL VASCULAR THROMBECTOMY;  Surgeon: Jama Cordella MATSU, MD;  Location: [facility];  Service: Cardiovascular;  Laterality: Left;    Family History  Problem Relation Age of Onset   Varicose Veins Neg Hx     Allergies[1]     Latest Ref Rng & Units 06/19/2024    3:29 AM 06/18/2024    3:29 AM 06/17/2024    8:30 AM  CBC  WBC 4.0 - 10.5 K/uL 8.5  10.5  9.3   Hemoglobin 13.0 - 17.0 g/dL 9.5  9.6  7.2   Hematocrit 39.0  - 52.0 % 29.8  29.2  22.5   Platelets 150 - 400 K/uL 341  346  319       CMP     Component Value Date/Time   NA 134 (L) 06/19/2024 0329   NA 141 04/02/2012 0955   K 4.5 06/19/2024 0329   K 3.8 04/02/2012 0955  CL 94 (L) 06/19/2024 0329   CL 105 04/02/2012 0955   CO2 27 06/19/2024 0329   CO2 24 04/02/2012 0955   GLUCOSE 90 06/19/2024 0329   GLUCOSE 161 (H) 04/02/2012 0955   BUN 45 (H) 06/19/2024 0329   BUN 14 04/02/2012 0955   CREATININE 7.81 (H) 06/19/2024 0329   CREATININE 1.65 (H) 04/02/2012 0955   CALCIUM  8.6 (L) 06/19/2024 0329   CALCIUM  9.3 04/02/2012 0955   PROT 8.3 (H) 06/05/2024 1658   PROT 7.7 03/30/2012 2039   ALBUMIN 3.0 (L) 06/13/2024 0740   ALBUMIN 2.8 (L) 03/30/2012 2039   AST 24 06/05/2024 1658   AST 16 03/30/2012 2039   ALT 11 06/05/2024 1658   ALT 22 03/30/2012 2039   ALKPHOS 89 06/05/2024 1658   ALKPHOS 79 03/30/2012 2039   BILITOT 0.4 06/05/2024 1658   BILITOT 0.9 03/30/2012 2039   GFRNONAA 7 (L) 06/19/2024 0329   GFRNONAA 44 (L) 04/02/2012 0955     No results found.     Assessment & Plan:   1. 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 Limited knee extension possibly related to infection. Infection not severe but requires monitoring. - Obtained wound culture. - Prescribed topical ointment. - Instructed nursing staff to swab and dress wound. - Scheduled follow-up in two weeks.   Assessment and Plan Assessment & Plan      Medications Ordered Prior to Encounter[2]  There are no Patient Instructions on file for this visit. Return for 2 weeks no studies JD/FB.   Oluwakemi Salsberry E Queen Abbett, NP      [1]  Allergies Allergen Reactions   Lisinopril Cough  [2]  Current Outpatient Medications on File Prior to Visit  Medication Sig Dispense Refill   acetaminophen  (TYLENOL ) 325  MG tablet Take 650 mg by mouth every 4 (four) hours as needed for mild pain (pain score 1-3).     alum & mag hydroxide-simeth (MAALOX/MYLANTA) 200-200-20 MG/5ML suspension Take 30 mLs by mouth every 6 (six) hours as needed for indigestion or heartburn.     aspirin  81 MG chewable tablet Chew 81 mg by mouth daily.     brimonidine -timolol  (COMBIGAN ) 0.2-0.5 % ophthalmic solution Place 1 drop into both eyes 2 (two) times daily.     calcitRIOL  (ROCALTROL ) 0.25 MCG capsule Take 1 capsule (0.25 mcg total) by mouth daily.     [Paused] carvedilol  (COREG ) 12.5 MG tablet Take 12.5 mg by mouth at bedtime.     clopidogrel  (PLAVIX ) 75 MG tablet Take 1 tablet (75 mg total) by mouth daily.     cyanocobalamin  1000 MCG tablet Take 1 tablet (1,000 mcg total) by mouth daily.     donepezil  (ARICEPT ) 10 MG tablet Take 10 mg by mouth at bedtime.     entecavir  (BARACLUDE ) 0.5 MG tablet Take 0.5 mg by mouth every 3 (three) days.     folic acid  (FOLVITE ) 1 MG tablet Take 1 tablet (1 mg total) by mouth daily.     guaiFENesin (ROBITUSSIN) 100 MG/5ML liquid Take 15 mLs by mouth every 4 (four) hours as needed for cough or to loosen phlegm.     HYDROmorphone  (DILAUDID ) 2 MG tablet Take 0.5 tablets (1 mg total) by mouth every 6 (six) hours as needed for severe pain (pain score 7-10) or moderate pain (pain score 4-6). 10 tablet 0   ipratropium-albuterol  (DUONEB) 0.5-2.5 (3)  MG/3ML SOLN Inhale 3 mLs into the lungs every 6 (six) hours as needed.     loperamide (IMODIUM A-D) 2 MG tablet Take 4 mg by mouth 3 (three) times daily as needed for diarrhea or loose stools.     LUMIGAN 0.01 % SOLN Place 1 drop into both eyes at bedtime.     magnesium  hydroxide (MILK OF MAGNESIA) 400 MG/5ML suspension Take 30 mLs by mouth daily as needed for mild constipation.     magnesium  oxide (MAG-OX) 400 (240 Mg) MG tablet Take 400 mg by mouth daily.     Melatonin 10 MG TABS Take 1 tablet by mouth at bedtime.     memantine  (NAMENDA ) 5 MG tablet Take 5  mg by mouth 2 (two) times daily.     midodrine  (PROAMATINE ) 10 MG tablet Take 10 mg by mouth Every Tuesday,Thursday,and Saturday with dialysis.     Oyster Shell 500 MG TABS Take 1,000 mg by mouth daily.     pantoprazole  (PROTONIX ) 40 MG tablet Take 40 mg by mouth daily.     RHOPRESSA 0.02 % SOLN Place 1 drop into both eyes at bedtime.     simvastatin  (ZOCOR ) 10 MG tablet Take 10 mg by mouth at bedtime.     Skin Protectants, Misc. (MINERIN) CREA Apply 1 application topically 2 (two) times daily.     [Paused] torsemide  (DEMADEX ) 20 MG tablet Take 80 mg by mouth daily.     traZODone (DESYREL) 50 MG tablet Take 75 mg by mouth at bedtime.     No current facility-administered medications on file prior to visit.   "

## 2024-08-07 LAB — AEROBIC CULTURE

## 2024-08-08 NOTE — Progress Notes (Signed)
 Provider is no longer with the practice as opposed and unable to finish the note.

## 2024-08-09 ENCOUNTER — Other Ambulatory Visit (INDEPENDENT_AMBULATORY_CARE_PROVIDER_SITE_OTHER): Payer: Self-pay | Admitting: Nurse Practitioner

## 2024-08-09 ENCOUNTER — Ambulatory Visit (INDEPENDENT_AMBULATORY_CARE_PROVIDER_SITE_OTHER): Payer: Self-pay | Admitting: Nurse Practitioner

## 2024-08-09 MED ORDER — CIPROFLOXACIN HCL 500 MG PO TABS: 500.0000 mg | ORAL_TABLET | Freq: Every day | ORAL | 0 refills | Status: AC

## 2024-08-09 NOTE — Progress Notes (Signed)
 Call and let him know I sent in Cipro  for his infection

## 2024-08-09 NOTE — Progress Notes (Signed)
 Orders for antibiotic has been faxed [facility]

## 2024-08-09 NOTE — Progress Notes (Signed)
 I spoke with the patient brother and will reach back out to give facility information that patient is currently living.

## 2024-08-18 ENCOUNTER — Ambulatory Visit (INDEPENDENT_AMBULATORY_CARE_PROVIDER_SITE_OTHER): Admitting: Nurse Practitioner

## 2024-08-18 ENCOUNTER — Encounter (INDEPENDENT_AMBULATORY_CARE_PROVIDER_SITE_OTHER): Payer: Self-pay | Admitting: Nurse Practitioner

## 2024-09-01 ENCOUNTER — Ambulatory Visit (INDEPENDENT_AMBULATORY_CARE_PROVIDER_SITE_OTHER): Admitting: Vascular Surgery
